# Patient Record
Sex: Female | Born: 1960 | Race: White | Hispanic: No | Marital: Married | Smoking: Current some day smoker
Health system: Southern US, Community
[De-identification: ages and names within clinical notes are randomized; demographics above are authoritative.]

## PROBLEM LIST (undated history)

## (undated) DIAGNOSIS — F319 Bipolar disorder, unspecified: Secondary | ICD-10-CM

## (undated) DIAGNOSIS — F259 Schizoaffective disorder, unspecified: Secondary | ICD-10-CM

## (undated) HISTORY — DX: Bipolar disorder, unspecified: F31.9

---

## 1997-09-25 ENCOUNTER — Inpatient Hospital Stay (HOSPITAL_COMMUNITY): Admission: RE | Admit: 1997-09-25 | Discharge: 1997-09-29 | Payer: Self-pay | Admitting: *Deleted

## 1997-10-01 ENCOUNTER — Encounter (HOSPITAL_COMMUNITY): Admission: RE | Admit: 1997-10-01 | Discharge: 1997-12-30 | Payer: Self-pay

## 1997-10-04 ENCOUNTER — Ambulatory Visit (HOSPITAL_COMMUNITY): Admission: RE | Admit: 1997-10-04 | Discharge: 1997-10-04 | Payer: Self-pay

## 1998-01-03 ENCOUNTER — Emergency Department (HOSPITAL_COMMUNITY): Admission: EM | Admit: 1998-01-03 | Discharge: 1998-01-03 | Payer: Self-pay

## 1998-01-07 ENCOUNTER — Other Ambulatory Visit: Admission: RE | Admit: 1998-01-07 | Discharge: 1998-01-07 | Payer: Self-pay | Admitting: Obstetrics and Gynecology

## 1999-04-22 ENCOUNTER — Other Ambulatory Visit: Admission: RE | Admit: 1999-04-22 | Discharge: 1999-04-22 | Payer: Self-pay | Admitting: *Deleted

## 2000-07-07 ENCOUNTER — Emergency Department (HOSPITAL_COMMUNITY): Admission: EM | Admit: 2000-07-07 | Discharge: 2000-07-07 | Payer: Self-pay | Admitting: Emergency Medicine

## 2000-09-01 ENCOUNTER — Other Ambulatory Visit: Admission: RE | Admit: 2000-09-01 | Discharge: 2000-09-01 | Payer: Self-pay | Admitting: *Deleted

## 2001-02-23 ENCOUNTER — Encounter: Admission: RE | Admit: 2001-02-23 | Discharge: 2001-02-23 | Payer: Self-pay | Admitting: Internal Medicine

## 2001-02-23 ENCOUNTER — Encounter: Payer: Self-pay | Admitting: Internal Medicine

## 2001-10-19 ENCOUNTER — Other Ambulatory Visit: Admission: RE | Admit: 2001-10-19 | Discharge: 2001-10-19 | Payer: Self-pay | Admitting: Obstetrics and Gynecology

## 2001-11-21 ENCOUNTER — Encounter: Payer: Self-pay | Admitting: Emergency Medicine

## 2001-11-21 ENCOUNTER — Emergency Department (HOSPITAL_COMMUNITY): Admission: EM | Admit: 2001-11-21 | Discharge: 2001-11-21 | Payer: Self-pay | Admitting: Emergency Medicine

## 2002-01-27 ENCOUNTER — Encounter: Payer: Self-pay | Admitting: Internal Medicine

## 2002-01-27 ENCOUNTER — Ambulatory Visit (HOSPITAL_COMMUNITY): Admission: RE | Admit: 2002-01-27 | Discharge: 2002-01-27 | Payer: Self-pay | Admitting: Internal Medicine

## 2002-09-06 ENCOUNTER — Encounter: Payer: Self-pay | Admitting: Obstetrics and Gynecology

## 2002-09-06 ENCOUNTER — Encounter: Admission: RE | Admit: 2002-09-06 | Discharge: 2002-09-06 | Payer: Self-pay | Admitting: Obstetrics and Gynecology

## 2002-10-26 ENCOUNTER — Other Ambulatory Visit: Admission: RE | Admit: 2002-10-26 | Discharge: 2002-10-26 | Payer: Self-pay | Admitting: Obstetrics and Gynecology

## 2003-05-15 ENCOUNTER — Encounter: Admission: RE | Admit: 2003-05-15 | Discharge: 2003-08-13 | Payer: Self-pay | Admitting: Internal Medicine

## 2003-10-25 ENCOUNTER — Encounter: Admission: RE | Admit: 2003-10-25 | Discharge: 2003-10-25 | Payer: Self-pay | Admitting: Obstetrics and Gynecology

## 2003-11-01 ENCOUNTER — Other Ambulatory Visit: Admission: RE | Admit: 2003-11-01 | Discharge: 2003-11-01 | Payer: Self-pay | Admitting: Obstetrics and Gynecology

## 2004-04-09 ENCOUNTER — Ambulatory Visit: Payer: Self-pay | Admitting: Internal Medicine

## 2004-10-30 ENCOUNTER — Encounter: Admission: RE | Admit: 2004-10-30 | Discharge: 2004-10-30 | Payer: Self-pay | Admitting: Obstetrics and Gynecology

## 2005-12-02 ENCOUNTER — Encounter: Admission: RE | Admit: 2005-12-02 | Discharge: 2005-12-02 | Payer: Self-pay | Admitting: Obstetrics and Gynecology

## 2007-07-21 ENCOUNTER — Encounter: Payer: Self-pay | Admitting: Internal Medicine

## 2007-07-21 DIAGNOSIS — F319 Bipolar disorder, unspecified: Secondary | ICD-10-CM

## 2007-10-18 ENCOUNTER — Telehealth (INDEPENDENT_AMBULATORY_CARE_PROVIDER_SITE_OTHER): Payer: Self-pay | Admitting: *Deleted

## 2008-07-03 ENCOUNTER — Ambulatory Visit: Payer: Self-pay | Admitting: Psychiatry

## 2008-07-11 ENCOUNTER — Ambulatory Visit: Payer: Self-pay | Admitting: Psychiatry

## 2008-08-01 ENCOUNTER — Ambulatory Visit: Payer: Self-pay | Admitting: Psychiatry

## 2015-05-10 ENCOUNTER — Ambulatory Visit (INDEPENDENT_AMBULATORY_CARE_PROVIDER_SITE_OTHER): Payer: 59 | Admitting: Psychiatry

## 2015-05-10 VITALS — BP 118/64 | HR 82 | Ht 64.75 in | Wt 163.2 lb

## 2015-05-10 DIAGNOSIS — F3162 Bipolar disorder, current episode mixed, moderate: Secondary | ICD-10-CM | POA: Diagnosis not present

## 2015-05-10 DIAGNOSIS — F3110 Bipolar disorder, current episode manic without psychotic features, unspecified: Secondary | ICD-10-CM

## 2015-05-10 MED ORDER — LITHIUM CARBONATE 300 MG PO TABS: ORAL_TABLET | ORAL | Status: DC

## 2015-05-10 MED ORDER — HALOPERIDOL 2 MG PO TABS
2.0000 mg | ORAL_TABLET | Freq: Two times a day (BID) | ORAL | Status: DC
Start: 2015-05-10 — End: 2015-10-23

## 2015-05-10 MED ORDER — LAMOTRIGINE 200 MG PO TABS: 200.0000 mg | ORAL_TABLET | Freq: Every day | ORAL | Status: DC

## 2015-05-10 NOTE — Progress Notes (Signed)
Psychiatric Initial Adult Assessment   Patient Identification: Megan Miranda MRN:  [id number] Date of Evaluation:  05/10/2015 Referral Source: Self-referred Chief Complaint:   need my medications Visit Diagnosis: Bipolar disorder1   ICD-9-CM ICD-10-CM   1. Bipolar 1 disorder, mixed, moderate (HCC) 296.62 F31.62 Lithium level     TSH     COMPLETE METABOLIC PANEL WITH GFR   Diagnosis:   Patient Active Problem List   Diagnosis Date Noted  . BIPOLAR I DISORDER MOST RECENT EPISODE UNSPEC [F31.9] 07/21/2007   History of Present Illness:  This patient is a 54 year old divorced female who is on disability for bipolar disorder. Her previous psychiatrist has left private practice. The patient is actually doing well. She still on her medicines but she needs renewal of prescriptions. She is on multiple psychotropic medicines. 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 She lives alone. She likes to walk and reading. Presently she is in no relationships. She has no children. The patient denies daily depression. She is sleeping and eating well. She's got good energy. She is no problems concentrating. She is a good sense of worth and denies being suicidal now or ever. The patient is a poor historian. For the most part she was interviewed alone but in the last 15 minutes we brought in her brother who brought her his name is Megan Miranda. The patient denies the use of alcohol or drugs. He's never used any these agents. Patient is never been psychotic. His best I can tell the year 2012 or 13 she was hospitalized at [facility] for a manic episode. She claims she is that episode of depression but is unclear the exact symptoms related to it or when it occurred. She claims she's been psychiatrically hospitalized over 4 times. The patient denies symptoms of a specific anxiety disorder. The patient grew up in [place] her parents were intact and she  did well in school. She never experienced any physical or sexual abuse. She does not smoke cigarettes. At this time the patient seems quite stable. Medically she has no medical conditions that she's being actively treated for. She's been in psychotherapy in the distant past for about 10 years. She's been under care Dr. Candis Schatz and Dr. Tamala Julian in the past. Elements:   Associated Signs/Symptoms: Depression Symptoms:   (Hypo) Manic Symptoms:   Anxiety Symptoms:   Psychotic Symptoms:   PTSD Symptoms:   Past Medical History: No past medical history on file. No past surgical history on file. Family History: No family history on file. Social History:   Social History   Social History  . Marital Status: Married    Spouse Name: N/A  . Number of Children: N/A  . Years of Education: N/A   Social History Main Topics  . Smoking status: Not on file  . Smokeless tobacco: Not on file  . Alcohol Use: Not on file  . Drug Use: Not on file  . Sexual Activity: Not on file   Other Topics Concern  . Not on file   Social History Narrative  . No narrative on file   Additional Social History:   Musculoskeletal: Strength & Muscle Tone: within normal limits Gait & Station: normal Patient leans: Right  Psychiatric Specialty Exam: HPI  ROS  Blood pressure 118/64, pulse 82, height 5' 4.75" (1.645 m), weight 163 lb 3.2 oz (74.027 kg).Body mass index is 27.36 kg/(m^2).  General Appearance: Casual  Eye Contact:  Good  Speech:  Clear and Coherent  Volume:  Normal  Mood:  Euthymic  Affect:  Blunt  Thought Process:  Coherent  Orientation:  Full (Time, Place, and Person)  Thought Content:  WDL  Suicidal Thoughts:  No  Homicidal Thoughts:  No  Memory:  NA  Judgement:  Good  Insight:  Good  Psychomotor Activity:  Normal  Concentration:  Good  Recall:  Good  Fund of Knowledge:Good  Language: Good  Akathisia:  No  Handed:  Right  AIMS (if indicated):    Assets:  Desire for Improvement   ADL's:  Intact  Cognition: WNL  Sleep:   Good    Is the patient at risk to self?  No. Has the patient been a risk to self in the past 6 months?  No. Has the patient been a risk to self within the distant past?  No. Is the patient a risk to others?  No. Has the patient been a risk to others in the past 6 months?  No. Has the patient been a risk to others within the distant past?  No.  Allergies:  Allergies not on file Current Medications: Current Outpatient Prescriptions  Medication Sig Dispense Refill  . haloperidol (HALDOL) 2 MG tablet Take 1 tablet (2 mg total) by mouth 2 (two) times daily. 30 tablet 5  . lamoTRIgine (LAMICTAL) 200 MG tablet Take 1 tablet (200 mg total) by mouth daily. 30 tablet 5  . lithium 300 MG tablet 3   qhs 90 tablet 5   No current facility-administered medications for this visit.    Previous Psychotropic Medications: Yes   Substance Abuse History in the last 12 months:  No.  Consequences of Substance Abuse: NA  Medical Decision Making:  Established Problem, Stable/Improving (1)  Treatment Plan Summary: At this time this patient clearly has a history of bipolar disorder. This is supported by the fact that she's had 2 psychiatrists make this diagnosis, that she is on disability for this condition and that she's been psychiatrically hospitalized multiple times. The patient did know the doses of her medicines we called her drugstore and put it in the computer. The patient is taking Lamictal 200 mg at night, lithium 300 mg 3 at night and Haldol 2 mg at night. Patient demonstrates no evidence of tardive dyskinesia at this time. Today we spent over 50% of time going over the education about her illness and about the use of her medications. Today we'll renew these medications will also get her to have some blood work done which included a lithium level a TSH and a comprehensive metabolic panel. The patient will think about therapy but at this time the patient is  doing well. Her brother Megan Miranda is very supportive and will bring her to our sessions. This patient return to see me in 2 and half months. Also get her to sign a release for her last discharge summary from [place] in 2013.    Gillermo Poch IRVING 12/16/201610:38 AM

## 2015-07-24 ENCOUNTER — Ambulatory Visit (HOSPITAL_COMMUNITY): Payer: Self-pay | Admitting: Psychiatry

## 2015-09-13 ENCOUNTER — Ambulatory Visit (HOSPITAL_COMMUNITY): Payer: Self-pay | Admitting: Psychiatry

## 2015-10-14 LAB — LITHIUM LEVEL: LITHIUM LVL: 1.2 meq/L (ref 0.80–1.40)

## 2015-10-23 ENCOUNTER — Ambulatory Visit (INDEPENDENT_AMBULATORY_CARE_PROVIDER_SITE_OTHER): Payer: 59 | Admitting: Psychiatry

## 2015-10-23 DIAGNOSIS — F3130 Bipolar disorder, current episode depressed, mild or moderate severity, unspecified: Secondary | ICD-10-CM

## 2015-10-23 DIAGNOSIS — F3162 Bipolar disorder, current episode mixed, moderate: Secondary | ICD-10-CM

## 2015-10-23 MED ORDER — LAMOTRIGINE 200 MG PO TABS: 200.0000 mg | ORAL_TABLET | Freq: Every day | ORAL | Status: DC

## 2015-10-23 MED ORDER — LITHIUM CARBONATE 300 MG PO TABS: ORAL_TABLET | ORAL | Status: DC

## 2015-10-23 MED ORDER — HALOPERIDOL 2 MG PO TABS: 2.0000 mg | ORAL_TABLET | Freq: Two times a day (BID) | ORAL | Status: DC

## 2015-10-23 NOTE — Progress Notes (Signed)
Patient ID: Megan Miranda, female   DOB: Nov 30, 1960, 55 y.o.   MRN: [id number]  Psychiatric Initial Adult Assessment   Patient Identification: Megan Miranda MRN:  [id number] Date of Evaluation:  10/23/2015 Referral Source: Self-referred Chief Complaint:   need my medications Visit Diagnosis: Bipolar disorder1   ICD-9-CM ICD-10-CM   1. Bipolar 1 disorder, mixed, moderate (HCC) 296.62 [id number] COMPLETE METABOLIC PANEL WITH GFR     Lithium level   Diagnosis:   Patient Active Problem List   Diagnosis Date Noted  . BIPOLAR I DISORDER MOST RECENT EPISODE UNSPEC [F31.9] 07/21/2007   Today the patient is seen for a 15 minute visit. She was seen almost 6 months ago and did not reappear after her first initial evaluation. She was supposed to come back in 2 months but never came. She now is back if she is running out of her medicines. This time she is without her brother. The patient claims that she is doing well. She denies daily depression. She denies any mania. Generally she sleeping and eating well. She demonstrates no tremor. Patient had a lithium level of 1.4 but never got her comprehensive metabolic panel or her thyroids. The patient was under the care of her primary care doctor but has disappeared from. I strongly recommended she return to a primary care doctor rather to her old one or a new one. The patient claims she is functioning well. She denies the use of alcohol or drugs. She actually says that she's better. She says she doesn't need her brother necessarily and she's taking care of herself. The patient is interested in joining swimming club. She likes to walk a great deal. Today we insisted that the patient return to see me just 2 months with her brother and that in fact she go back and get a repeat lithium level and a comprehensive metabolic panel. The patient shows no evidence of psychosis. She's not suicidal. She seems fairly stable. Is very clear this patient is noncompliant. Associated  Signs/Symptoms: Depression Symptoms:   (Hypo) Manic Symptoms:   Anxiety Symptoms:   Psychotic Symptoms:   PTSD Symptoms:   Past Medical History: No past medical history on file. No past surgical history on file. Family History: No family history on file. Social History:   Social History   Social History  . Marital Status: Married    Spouse Name: N/A  . Number of Children: N/A  . Years of Education: N/A   Social History Main Topics  . Smoking status: Not on file  . Smokeless tobacco: Not on file  . Alcohol Use: Not on file  . Drug Use: Not on file  . Sexual Activity: Not on file   Other Topics Concern  . Not on file   Social History Narrative  . No narrative on file   Additional Social History:   Musculoskeletal: Strength & Muscle Tone: within normal limits Gait & Station: normal Patient leans: Right  Psychiatric Specialty Exam: HPI  ROS  There were no vitals taken for this visit.There is no weight on file to calculate BMI.  General Appearance: Casual  Eye Contact:  Good  Speech:  Clear and Coherent  Volume:  Normal  Mood:  Euthymic  Affect:  Blunt  Thought Process:  Coherent  Orientation:  Full (Time, Place, and Person)  Thought Content:  WDL  Suicidal Thoughts:  No  Homicidal Thoughts:  No  Memory:  NA  Judgement:  Good  Insight:  Good  Psychomotor Activity:  Normal  Concentration:  Good  Recall:  Good  Fund of Knowledge:Good  Language: Good  Akathisia:  No  Handed:  Right  AIMS (if indicated):    Assets:  Desire for Improvement  ADL's:  Intact  Cognition: WNL  Sleep:   Good    Is the patient at risk to self?  No. Has the patient been a risk to self in the past 6 months?  No. Has the patient been a risk to self within the distant past?  No. Is the patient a risk to others?  No. Has the patient been a risk to others in the past 6 months?  No. Has the patient been a risk to others within the distant past?  No.  Allergies:  Not on  File Current Medications: Current Outpatient Prescriptions  Medication Sig Dispense Refill  . haloperidol (HALDOL) 2 MG tablet Take 1 tablet (2 mg total) by mouth 2 (two) times daily. 30 tablet 3  . lamoTRIgine (LAMICTAL) 200 MG tablet Take 1 tablet (200 mg total) by mouth daily. 30 tablet 3  . lithium 300 MG tablet 3   qhs 90 tablet 3   No current facility-administered medications for this visit.    Previous Psychotropic Medications: Yes   Substance Abuse History in the last 12 months:  No.  Consequences of Substance Abuse: NA  Medical Decision Making:  Established Problem, Stable/Improving (1) At this time the patient will continue taking Lamictal 200 mg continue her lithium 300 mg 3 at night and continue Haldol 2 mg. The patient shows no evidence of tardive dyskinesia. Patient actually says she's been on Haldol for well over a decade. Is clear any more information. We'll start off by getting some blood work and have her return in about 8-9 weeks with her brother. The patient seems emotionally stable. But in fact she did not return as I asked him to months but instead in 5 months. We'll give her prescriptions only enough to last 2 or 3 months. The patient demonstrates no evidence of lithium toxicity. She says her mood is stable. She says she's been taking the same dose for many years. Patient certainly is not suicidal and seems to be functioning.  Megan Miranda 5/31/20173:36 PM

## 2015-10-28 ENCOUNTER — Other Ambulatory Visit (HOSPITAL_COMMUNITY): Payer: Self-pay | Admitting: Psychiatry

## 2015-12-03 ENCOUNTER — Other Ambulatory Visit (HOSPITAL_COMMUNITY): Payer: Self-pay | Admitting: Psychiatry

## 2015-12-04 ENCOUNTER — Other Ambulatory Visit (HOSPITAL_COMMUNITY): Payer: Self-pay

## 2015-12-04 MED ORDER — HALOPERIDOL 2 MG PO TABS: 2.0000 mg | ORAL_TABLET | Freq: Two times a day (BID) | ORAL | Status: DC

## 2015-12-26 ENCOUNTER — Other Ambulatory Visit (HOSPITAL_COMMUNITY): Payer: Self-pay | Admitting: Psychiatry

## 2015-12-26 MED ORDER — LITHIUM CARBONATE 300 MG PO TABS: ORAL_TABLET | ORAL | 3 refills | Status: DC

## 2015-12-26 MED ORDER — LAMOTRIGINE 200 MG PO TABS: 200.0000 mg | ORAL_TABLET | Freq: Every day | ORAL | 3 refills | Status: DC

## 2015-12-26 MED ORDER — HALOPERIDOL 2 MG PO TABS: 2.0000 mg | ORAL_TABLET | Freq: Two times a day (BID) | ORAL | 3 refills | Status: DC

## 2016-01-16 ENCOUNTER — Ambulatory Visit (HOSPITAL_COMMUNITY): Payer: Self-pay | Admitting: Psychiatry

## 2016-02-29 LAB — LITHIUM LEVEL: LITHIUM LVL: 1 mmol/L (ref 0.80–1.40)

## 2016-03-02 ENCOUNTER — Encounter: Payer: Self-pay | Admitting: Psychiatry

## 2016-03-19 ENCOUNTER — Ambulatory Visit (HOSPITAL_COMMUNITY): Payer: Self-pay | Admitting: Psychiatry

## 2016-04-09 ENCOUNTER — Other Ambulatory Visit (HOSPITAL_COMMUNITY): Payer: Self-pay

## 2016-04-09 MED ORDER — LAMOTRIGINE 200 MG PO TABS: 200.0000 mg | ORAL_TABLET | Freq: Every day | ORAL | 0 refills | Status: DC

## 2016-04-09 MED ORDER — HALOPERIDOL 2 MG PO TABS: 2.0000 mg | ORAL_TABLET | Freq: Two times a day (BID) | ORAL | 0 refills | Status: DC

## 2016-04-09 MED ORDER — LITHIUM CARBONATE 300 MG PO TABS: ORAL_TABLET | ORAL | 0 refills | Status: DC

## 2016-04-10 ENCOUNTER — Telehealth (HOSPITAL_COMMUNITY): Payer: Self-pay

## 2016-04-10 NOTE — Telephone Encounter (Signed)
Medication refill requests - Telephone call with pt to inform her medication refills had already been sent to her [facility] as pt. requested.  Reminded of need to keep appointment on 05/13/16 for further orders and provided new outpt. address.   Patient stated understanding and to call back if any other concerns.

## 2016-05-13 ENCOUNTER — Ambulatory Visit (HOSPITAL_COMMUNITY): Payer: Self-pay | Admitting: Psychiatry

## 2016-06-18 ENCOUNTER — Ambulatory Visit (INDEPENDENT_AMBULATORY_CARE_PROVIDER_SITE_OTHER): Payer: 59 | Admitting: Psychiatry

## 2016-06-18 ENCOUNTER — Encounter (HOSPITAL_COMMUNITY): Payer: Self-pay | Admitting: Psychiatry

## 2016-06-18 VITALS — BP 140/86 | HR 103 | Ht 65.0 in | Wt 167.0 lb

## 2016-06-18 DIAGNOSIS — F17210 Nicotine dependence, cigarettes, uncomplicated: Secondary | ICD-10-CM

## 2016-06-18 DIAGNOSIS — F3130 Bipolar disorder, current episode depressed, mild or moderate severity, unspecified: Secondary | ICD-10-CM | POA: Diagnosis not present

## 2016-06-18 MED ORDER — LAMOTRIGINE 200 MG PO TABS: 200.0000 mg | ORAL_TABLET | Freq: Every day | ORAL | 1 refills | Status: DC

## 2016-06-18 MED ORDER — LITHIUM CARBONATE 300 MG PO TABS: ORAL_TABLET | ORAL | 0 refills | Status: DC

## 2016-06-18 MED ORDER — HALOPERIDOL 2 MG PO TABS: 2.0000 mg | ORAL_TABLET | Freq: Two times a day (BID) | ORAL | 0 refills | Status: DC

## 2016-06-18 NOTE — Progress Notes (Signed)
Patient ID: [name], female   DOB: 1961/03/07, 56 y.o.   MRN: [id number]  Psychiatric Initial Adult Assessment   Patient Identification: Megan Miranda MRN:  [id number] Date of Evaluation:  06/18/2016 Referral Source: Self-referred Chief Complaint:   need my medications Visit Diagnosis: Bipolar disorder1 No diagnosis found. Diagnosis:   Patient Active Problem List   Diagnosis Date Noted  . BIPOLAR I DISORDER MOST RECENT EPISODE UNSPEC [F31.9] 07/21/2007   Today the patient is seen one time. The patient is actually doing fairly well. She's taking her medicines just as prescribed. She denies depression or euphoria. She's not irritable. She is actually sleeping and eating well. She denies the use of alcohol or drugs. The patient is enjoying life andstatus as they are. Her brother is helping her get her medications. There were some problems with her pharmacy but were going to work that out. The patient's lithium level is somewhat higher side and on her next visit in 3 months we will repeat her lithium level. The patient shows no signs of lithium toxicity. He shows no signs of tardive dyskinesia. She takes Lamictal 1 over basis, Haldol and lithium. The patient is not suicidal. She denies any physical complaints at this time. Depression Symptoms:   (Hypo) Manic Symptoms:   Anxiety Symptoms:   Psychotic Symptoms:   PTSD Symptoms:   Past Medical History:  Past Medical History:  Diagnosis Date  . Bipolar disorder ([facility])    History reviewed. No pertinent surgical history. Family History: History reviewed. No pertinent family history. Social History:   Social History   Social History  . Marital status: Married    Spouse name: N/A  . Number of children: N/A  . Years of education: N/A   Social History Main Topics  . Smoking status: Current Some Day Smoker    Years: 2.00  . Smokeless tobacco: Never Used     Comment: Smokes a few cigarettes a week.   . Alcohol use No  . Drug use: No  .  Sexual activity: No   Other Topics Concern  . None   Social History Narrative  . None   Additional Social History:   Musculoskeletal: Strength & Muscle Tone: within normal limits Gait & Station: normal Patient leans: Right  Psychiatric Specialty Exam: HPI  ROS  Blood pressure 140/86, pulse (!) 103, height 5\' 5"  (1.651 m), weight 167 lb (75.8 kg).Body mass index is 27.79 kg/m.  General Appearance: Casual  Eye Contact:  Good  Speech:  Clear and Coherent  Volume:  Normal  Mood:  Euthymic  Affect:  Blunt  Thought Process:  Coherent  Orientation:  Full (Time, Place, and Person)  Thought Content:  WDL  Suicidal Thoughts:  No  Homicidal Thoughts:  No  Memory:  NA  Judgement:  Good  Insight:  Good  Psychomotor Activity:  Normal  Concentration:  Good  Recall:  Good  Fund of Knowledge:Good  Language: Good  Akathisia:  No  Handed:  Right  AIMS (if indicated):    Assets:  Desire for Improvement  ADL's:  Intact  Cognition: WNL  Sleep:   Good    Is the patient at risk to self?  No. Has the patient been a risk to self in the past 6 months?  No. Has the patient been a risk to self within the distant past?  No. Is the patient a risk to others?  No. Has the patient been a risk to others in the past 6 months?  No. Has the patient been a risk to others within the distant past?  No.  Allergies:   Allergies  Allergen Reactions  . Soap Rash   Current Medications: Current Outpatient Prescriptions  Medication Sig Dispense Refill  . haloperidol (HALDOL) 2 MG tablet Take 1 tablet (2 mg total) by mouth 2 (two) times daily. 180 tablet 0  . haloperidol (HALDOL) 2 MG tablet Take 1 tablet (2 mg total) by mouth 2 (two) times daily. 180 tablet 0  . lamoTRIgine (LAMICTAL) 200 MG tablet Take 1 tablet (200 mg total) by mouth daily. 90 tablet 1  . lithium 300 MG tablet 3   qhs 270 tablet 0   No current facility-administered medications for this visit.     Previous Psychotropic  Medications: Yes   Substance Abuse History in the last 12 months:  No.  Consequences of Substance Abuse: NA  Medical Decision Making:  At this time the patient continue all prior medications including her Lamictal the patient return to see me in about 3 months where we will repeat a lithium level. Patient is not suicidal. She is functioning fairly well. His rational clear and appropriate. She seems to be stable. She is no complaints. Charlestine Night Jontue Crumpacker 1/25/20184:49 PM

## 2016-08-28 ENCOUNTER — Other Ambulatory Visit (HOSPITAL_COMMUNITY): Payer: Self-pay

## 2016-08-28 MED ORDER — LITHIUM CARBONATE 300 MG PO TABS: ORAL_TABLET | ORAL | 0 refills | Status: DC

## 2016-08-28 MED ORDER — LAMOTRIGINE 200 MG PO TABS: 200.0000 mg | ORAL_TABLET | Freq: Every day | ORAL | 0 refills | Status: DC

## 2016-08-28 MED ORDER — HALOPERIDOL 2 MG PO TABS: 2.0000 mg | ORAL_TABLET | Freq: Two times a day (BID) | ORAL | 0 refills | Status: DC

## 2016-09-09 ENCOUNTER — Ambulatory Visit (HOSPITAL_COMMUNITY): Payer: Self-pay | Admitting: Psychiatry

## 2016-10-27 ENCOUNTER — Other Ambulatory Visit (HOSPITAL_COMMUNITY): Payer: Self-pay

## 2016-10-27 MED ORDER — HALOPERIDOL 2 MG PO TABS: 2.0000 mg | ORAL_TABLET | Freq: Two times a day (BID) | ORAL | 0 refills | Status: DC

## 2016-10-27 MED ORDER — LAMOTRIGINE 200 MG PO TABS: 200.0000 mg | ORAL_TABLET | Freq: Every day | ORAL | 0 refills | Status: DC

## 2016-10-28 ENCOUNTER — Telehealth (HOSPITAL_COMMUNITY): Payer: Self-pay

## 2016-10-28 NOTE — Telephone Encounter (Signed)
Medication management - Telephone call with patient after she called to request refill of medications. Informed 90 day refills of Haldol and Lamictal sent to her pharmacy 10/27/16 and questioned why patient canceled and rescheduled her appt. again.  Patient stated she had summer flu like symptoms and was not feeling well enough to come in so she rescheduled for 01/22/17.  Informed patient she would need to call back when Lithium needs filling and would need to keep next appointment for any further refills as has been since 06/18/16 since patient last evaluated and patient agreed with plan.

## 2016-10-29 ENCOUNTER — Ambulatory Visit (HOSPITAL_COMMUNITY): Payer: Self-pay | Admitting: Psychiatry

## 2016-11-02 ENCOUNTER — Telehealth (HOSPITAL_COMMUNITY): Payer: Self-pay

## 2016-11-02 NOTE — Telephone Encounter (Signed)
Patient would like to know if you want a lithium level before her next appointment. Please review and advise, thank you

## 2016-11-05 NOTE — Telephone Encounter (Signed)
Met with Dr. Casimiro Needle who reviewed patient's record and agreed he would like to check patient's Lithium level prior to next appointment.  Dr. Casimiro Needle also ordered a TSH and comprehensive metabolic panel for patient to come in and do fasting and prior to taking medications in the morning.

## 2016-11-06 NOTE — Telephone Encounter (Signed)
Okay called the patient and her husband to let them know they can come by any morning and get the blood work done.

## 2017-01-12 ENCOUNTER — Telehealth (HOSPITAL_COMMUNITY): Payer: Self-pay

## 2017-01-12 NOTE — Telephone Encounter (Signed)
Patient called and said she needs a 90 day refill , I told patient I needed to speak to you first as she has cancelled her last 3 appointments and is cancelling her appointment on 9/5 and said she will reschedule it for December. We have not seen her since January.

## 2017-01-19 NOTE — Telephone Encounter (Signed)
I called patient and advised her that Dr. Casimiro Needle said she needs to come to appointment or no refill, patinet said she would ne here

## 2017-01-22 ENCOUNTER — Ambulatory Visit (HOSPITAL_COMMUNITY): Payer: Self-pay | Admitting: Psychiatry

## 2017-01-27 ENCOUNTER — Ambulatory Visit (INDEPENDENT_AMBULATORY_CARE_PROVIDER_SITE_OTHER): Payer: 59 | Admitting: Psychiatry

## 2017-01-27 ENCOUNTER — Encounter (HOSPITAL_COMMUNITY): Payer: Self-pay | Admitting: Psychiatry

## 2017-01-27 VITALS — BP 128/74 | HR 96 | Ht 64.0 in | Wt 160.0 lb

## 2017-01-27 DIAGNOSIS — F17210 Nicotine dependence, cigarettes, uncomplicated: Secondary | ICD-10-CM

## 2017-01-27 DIAGNOSIS — F3130 Bipolar disorder, current episode depressed, mild or moderate severity, unspecified: Secondary | ICD-10-CM

## 2017-01-27 DIAGNOSIS — F3162 Bipolar disorder, current episode mixed, moderate: Secondary | ICD-10-CM

## 2017-01-27 MED ORDER — LITHIUM CARBONATE 300 MG PO TABS: ORAL_TABLET | ORAL | 0 refills | Status: DC

## 2017-01-27 MED ORDER — HALOPERIDOL 2 MG PO TABS: 2.0000 mg | ORAL_TABLET | Freq: Two times a day (BID) | ORAL | 0 refills | Status: DC

## 2017-01-27 MED ORDER — LAMOTRIGINE 200 MG PO TABS: 200.0000 mg | ORAL_TABLET | Freq: Every day | ORAL | 0 refills | Status: DC

## 2017-01-27 NOTE — Progress Notes (Signed)
Patient ID: Megan Miranda, female   DOB: 11-01-1960, 56 y.o.   MRN: [id number]  Psychiatric Initial Adult Assessment   Patient Identification: Megan Miranda MRN:  [id number] Date of Evaluation:  01/27/2017 Referral Source: Self-referred Chief Complaint:   Chief Complaint    Follow-up     need my medications Visit Diagnosis: Bipolar disorder1   ICD-10-CM   1. Bipolar 1 disorder, mixed, moderate (HCC) F31.62 Lithium level    Comprehensive Metabolic Panel (CMET)    TSH   Diagnosis:   Patient Active Problem List   Diagnosis Date Noted  . BIPOLAR I DISORDER MOST RECENT EPISODE UNSPEC [F31.9] 07/21/2007   Today the patient is actually doing well. His appointments. Overall overall is good. She sleeping and eating well. She shows no evidence of psychosis. He shows no evidence of mania. It is not clear she really truly ever had. The patient drinks no alcohol uses no drugs. He lives in a townhouse she likes. She exercises by walking. Patient patient message as prescribed. She demonstrates no evidence of tardive dyskinesia. She's taking   Haldol and she got much worse. The patient has good energy. She is dressed well. She's rational and reasonable.  Anxiety Symptoms:   Psychotic Symptoms:   PTSD Symptoms:   Past Medical History:  Past Medical History:  Diagnosis Date  . Bipolar disorder ([place])    History reviewed. No pertinent surgical history. Family History: History reviewed. No pertinent family history. Social History:   Social History   Social History  . Marital status: Married    Spouse name: N/A  . Number of children: N/A  . Years of education: N/A   Social History Main Topics  . Smoking status: Current Some Day Smoker    Packs/day: 0.10    Years: 2.00  . Smokeless tobacco: Never Used     Comment: Smokes a few cigarettes a week.   . Alcohol use No  . Drug use: No  . Sexual activity: No   Other Topics Concern  . None   Social History Narrative  . None   Additional  Social History:   Musculoskeletal: Strength & Muscle Tone: within normal limits Gait & Station: normal Patient leans: Right  Psychiatric Specialty Exam: HPI  ROS  Blood pressure 128/74, pulse 96, height 5\' 4"  (1.626 m), weight 160 lb (72.6 kg), SpO2 97 %.Body mass index is 27.46 kg/m.  General Appearance: Casual  Eye Contact:  Good  Speech:  Clear and Coherent  Volume:  Normal  Mood:  Euthymic  Affect:  Blunt  Thought Process:  Coherent  Orientation:  Full (Time, Place, and Person)  Thought Content:  WDL  Suicidal Thoughts:  No  Homicidal Thoughts:  No  Memory:  NA  Judgement:  Good  Insight:  Good  Psychomotor Activity:  Normal  Concentration:  Good  Recall:  Good  Fund of Knowledge:Good  Language: Good  Akathisia:  No  Handed:  Right  AIMS (if indicated):    Assets:  Desire for Improvement  ADL's:  Intact  Cognition: WNL  Sleep:   Good    Is the patient at risk to self?  No. Has the patient been a risk to self in the past 6 months?  No. Has the patient been a risk to self within the distant past?  No. Is the patient a risk to others?  No. Has the patient been a risk to others in the past 6 months?  No. Has the patient been a  risk to others within the distant past?  No.  Allergies:   Allergies  Allergen Reactions  . Soap Rash   Current Medications: Current Outpatient Prescriptions  Medication Sig Dispense Refill  . haloperidol (HALDOL) 2 MG tablet Take 1 tablet (2 mg total) by mouth 2 (two) times daily. 180 tablet 0  . lamoTRIgine (LAMICTAL) 200 MG tablet Take 1 tablet (200 mg total) by mouth daily. 90 tablet 0  . lithium 300 MG tablet 3   qhs 270 tablet 0   No current facility-administered medications for this visit.     Previous Psychotropic Medications: Yes   Substance Abuse History in the last 12 months:  No.  Consequences of Substance Abuse:  at this time we'll go ahead and get a lithium blood level and other blood work. She takes lithium 300  mg at night and she'll. She takes how often milligrams. Continued as well she also takes Lamictal 200 mg every night. I think the patient is very stable at this time. The possibility of getting off alcohol should possibly be considered. Vascular come again in 2-3 months bring her brother at bedtime. Patient is stable. She's not suicidal. He denies chest pain or shortness of breath.

## 2017-04-06 ENCOUNTER — Telehealth (HOSPITAL_COMMUNITY): Payer: Self-pay

## 2017-04-06 NOTE — Telephone Encounter (Signed)
Patients son is calling, there is a back order on Haldol right now and he would like to know if there is something else that the patient can take. Please review and advise, thank you

## 2017-04-07 ENCOUNTER — Other Ambulatory Visit (HOSPITAL_COMMUNITY): Payer: Self-pay

## 2017-04-07 MED ORDER — FLUPHENAZINE HCL 1 MG PO TABS: 1.0000 mg | ORAL_TABLET | Freq: Two times a day (BID) | ORAL | 3 refills | Status: DC

## 2017-05-20 ENCOUNTER — Other Ambulatory Visit (HOSPITAL_COMMUNITY): Payer: Self-pay

## 2017-05-20 MED ORDER — LITHIUM CARBONATE 300 MG PO TABS: ORAL_TABLET | ORAL | 0 refills | Status: DC

## 2017-05-20 MED ORDER — LAMOTRIGINE 200 MG PO TABS: 200.0000 mg | ORAL_TABLET | Freq: Every day | ORAL | 0 refills | Status: DC

## 2017-06-02 ENCOUNTER — Ambulatory Visit (HOSPITAL_COMMUNITY): Payer: Self-pay | Admitting: Psychiatry

## 2017-07-27 ENCOUNTER — Other Ambulatory Visit (HOSPITAL_COMMUNITY): Payer: Self-pay

## 2017-07-27 MED ORDER — FLUPHENAZINE HCL 1 MG PO TABS: 1.0000 mg | ORAL_TABLET | Freq: Two times a day (BID) | ORAL | 0 refills | Status: DC

## 2017-07-28 ENCOUNTER — Ambulatory Visit (HOSPITAL_COMMUNITY): Payer: Self-pay | Admitting: Psychiatry

## 2017-08-07 ENCOUNTER — Other Ambulatory Visit (HOSPITAL_COMMUNITY): Payer: Self-pay

## 2017-08-07 MED ORDER — LITHIUM CARBONATE 300 MG PO TABS: ORAL_TABLET | ORAL | 0 refills | Status: DC

## 2017-08-23 ENCOUNTER — Other Ambulatory Visit (HOSPITAL_COMMUNITY): Payer: Self-pay

## 2017-08-23 MED ORDER — FLUPHENAZINE HCL 1 MG PO TABS: 1.0000 mg | ORAL_TABLET | Freq: Two times a day (BID) | ORAL | 1 refills | Status: DC

## 2017-08-23 MED ORDER — LITHIUM CARBONATE 300 MG PO TABS: ORAL_TABLET | ORAL | 0 refills | Status: DC

## 2017-08-23 MED ORDER — LAMOTRIGINE 200 MG PO TABS: 200.0000 mg | ORAL_TABLET | Freq: Every day | ORAL | 1 refills | Status: DC

## 2017-08-25 ENCOUNTER — Ambulatory Visit (HOSPITAL_COMMUNITY): Payer: 59 | Admitting: Psychiatry

## 2017-09-03 ENCOUNTER — Other Ambulatory Visit (HOSPITAL_COMMUNITY): Payer: Self-pay

## 2017-09-03 MED ORDER — FLUPHENAZINE HCL 1 MG PO TABS: 1.0000 mg | ORAL_TABLET | Freq: Two times a day (BID) | ORAL | 2 refills | Status: DC

## 2017-09-03 MED ORDER — LAMOTRIGINE 200 MG PO TABS: 200.0000 mg | ORAL_TABLET | Freq: Every day | ORAL | 1 refills | Status: DC

## 2017-09-03 MED ORDER — LITHIUM CARBONATE 300 MG PO TABS: ORAL_TABLET | ORAL | 2 refills | Status: DC

## 2017-10-13 ENCOUNTER — Ambulatory Visit (HOSPITAL_COMMUNITY): Payer: 59 | Admitting: Psychiatry

## 2017-10-15 ENCOUNTER — Inpatient Hospital Stay (HOSPITAL_COMMUNITY)
Admission: EM | Admit: 2017-10-15 | Discharge: 2017-10-17 | DRG: 918 | Discharge status: Against Medical Advice | Payer: 59 | Attending: Internal Medicine | Admitting: Internal Medicine

## 2017-10-15 ENCOUNTER — Emergency Department (HOSPITAL_COMMUNITY): Payer: 59

## 2017-10-15 ENCOUNTER — Encounter (HOSPITAL_COMMUNITY): Payer: Self-pay | Admitting: Emergency Medicine

## 2017-10-15 DIAGNOSIS — N184 Chronic kidney disease, stage 4 (severe): Secondary | ICD-10-CM | POA: Diagnosis present

## 2017-10-15 DIAGNOSIS — R4182 Altered mental status, unspecified: Secondary | ICD-10-CM | POA: Diagnosis not present

## 2017-10-15 DIAGNOSIS — F17210 Nicotine dependence, cigarettes, uncomplicated: Secondary | ICD-10-CM | POA: Diagnosis present

## 2017-10-15 DIAGNOSIS — D649 Anemia, unspecified: Secondary | ICD-10-CM | POA: Diagnosis present

## 2017-10-15 DIAGNOSIS — E872 Acidosis, unspecified: Secondary | ICD-10-CM | POA: Diagnosis present

## 2017-10-15 DIAGNOSIS — F319 Bipolar disorder, unspecified: Secondary | ICD-10-CM | POA: Diagnosis not present

## 2017-10-15 DIAGNOSIS — T56891A Toxic effect of other metals, accidental (unintentional), initial encounter: Secondary | ICD-10-CM | POA: Diagnosis present

## 2017-10-15 DIAGNOSIS — Y92009 Unspecified place in unspecified non-institutional (private) residence as the place of occurrence of the external cause: Secondary | ICD-10-CM | POA: Diagnosis not present

## 2017-10-15 DIAGNOSIS — F1010 Alcohol abuse, uncomplicated: Secondary | ICD-10-CM | POA: Diagnosis present

## 2017-10-15 DIAGNOSIS — R946 Abnormal results of thyroid function studies: Secondary | ICD-10-CM | POA: Diagnosis present

## 2017-10-15 DIAGNOSIS — E876 Hypokalemia: Secondary | ICD-10-CM | POA: Diagnosis present

## 2017-10-15 DIAGNOSIS — R451 Restlessness and agitation: Secondary | ICD-10-CM | POA: Diagnosis present

## 2017-10-15 DIAGNOSIS — E878 Other disorders of electrolyte and fluid balance, not elsewhere classified: Secondary | ICD-10-CM | POA: Diagnosis present

## 2017-10-15 DIAGNOSIS — R6510 Systemic inflammatory response syndrome (SIRS) of non-infectious origin without acute organ dysfunction: Secondary | ICD-10-CM | POA: Diagnosis present

## 2017-10-15 DIAGNOSIS — F1011 Alcohol abuse, in remission: Secondary | ICD-10-CM

## 2017-10-15 DIAGNOSIS — F250 Schizoaffective disorder, bipolar type: Secondary | ICD-10-CM | POA: Diagnosis present

## 2017-10-15 DIAGNOSIS — N179 Acute kidney failure, unspecified: Secondary | ICD-10-CM | POA: Diagnosis present

## 2017-10-15 DIAGNOSIS — Z87898 Personal history of other specified conditions: Secondary | ICD-10-CM | POA: Diagnosis not present

## 2017-10-15 DIAGNOSIS — E039 Hypothyroidism, unspecified: Secondary | ICD-10-CM

## 2017-10-15 DIAGNOSIS — R7989 Other specified abnormal findings of blood chemistry: Secondary | ICD-10-CM | POA: Diagnosis not present

## 2017-10-15 DIAGNOSIS — E860 Dehydration: Secondary | ICD-10-CM | POA: Diagnosis present

## 2017-10-15 LAB — COMPREHENSIVE METABOLIC PANEL
ALT: 34 U/L (ref 14–54)
ALT: 28 U/L (ref 14–54)
ANION GAP: 9 (ref 5–15)
AST: 33 U/L (ref 15–41)
AST: 32 U/L (ref 15–41)
Albumin: 3.7 g/dL (ref 3.5–5.0)
Albumin: 3.4 g/dL — ABNORMAL LOW (ref 3.5–5.0)
Alkaline Phosphatase: 86 U/L (ref 38–126)
Alkaline Phosphatase: 79 U/L (ref 38–126)
Anion gap: 8 (ref 5–15)
BILIRUBIN TOTAL: 0.7 mg/dL (ref 0.3–1.2)
BILIRUBIN TOTAL: 0.4 mg/dL (ref 0.3–1.2)
BUN: 13 mg/dL (ref 6–20)
BUN: 13 mg/dL (ref 6–20)
CALCIUM: 8.6 mg/dL — AB (ref 8.9–10.3)
CO2: 20 mmol/L — ABNORMAL LOW (ref 22–32)
CO2: 20 mmol/L — ABNORMAL LOW (ref 22–32)
Calcium: 9.3 mg/dL (ref 8.9–10.3)
Chloride: 102 mmol/L (ref 101–111)
Chloride: 105 mmol/L (ref 101–111)
Creatinine, Ser: 1.86 mg/dL — ABNORMAL HIGH (ref 0.44–1.00)
Creatinine, Ser: 2 mg/dL — ABNORMAL HIGH (ref 0.44–1.00)
GFR calc Af Amer: 33 mL/min — ABNORMAL LOW (ref >60)
GFR calc non Af Amer: 26 mL/min — ABNORMAL LOW (ref >60)
GFR, EST AFRICAN AMERICAN: 31 mL/min — AB (ref >60)
GFR, EST NON AFRICAN AMERICAN: 29 mL/min — AB (ref >60)
GLUCOSE: 129 mg/dL — AB (ref 65–99)
Glucose, Bld: 116 mg/dL — ABNORMAL HIGH (ref 65–99)
POTASSIUM: 3 mmol/L — AB (ref 3.5–5.1)
Potassium: 3 mmol/L — ABNORMAL LOW (ref 3.5–5.1)
Sodium: 130 mmol/L — ABNORMAL LOW (ref 135–145)
Sodium: 134 mmol/L — ABNORMAL LOW (ref 135–145)
TOTAL PROTEIN: 5.6 g/dL — AB (ref 6.5–8.1)
Total Protein: 6.3 g/dL — ABNORMAL LOW (ref 6.5–8.1)

## 2017-10-15 LAB — URINALYSIS, ROUTINE W REFLEX MICROSCOPIC
BILIRUBIN URINE: NEGATIVE
Glucose, UA: NEGATIVE mg/dL
HGB URINE DIPSTICK: NEGATIVE
Ketones, ur: NEGATIVE mg/dL
Leukocytes, UA: NEGATIVE
NITRITE: NEGATIVE
PH: 7 (ref 5.0–8.0)
Protein, ur: NEGATIVE mg/dL
SPECIFIC GRAVITY, URINE: 1.003 — AB (ref 1.005–1.030)

## 2017-10-15 LAB — I-STAT CHEM 8, ED
BUN: 13 mg/dL (ref 6–20)
CREATININE: 2 mg/dL — AB (ref 0.44–1.00)
Calcium, Ion: 1.25 mmol/L (ref 1.15–1.40)
Chloride: 98 mmol/L — ABNORMAL LOW (ref 101–111)
Glucose, Bld: 172 mg/dL — ABNORMAL HIGH (ref 65–99)
HEMATOCRIT: 44 % (ref 36.0–46.0)
Hemoglobin: 15 g/dL (ref 12.0–15.0)
Potassium: 3.2 mmol/L — ABNORMAL LOW (ref 3.5–5.1)
Sodium: 128 mmol/L — ABNORMAL LOW (ref 135–145)
TCO2: 22 mmol/L (ref 22–32)

## 2017-10-15 LAB — CBC WITH DIFFERENTIAL/PLATELET
Basophils Absolute: 0 10*3/uL (ref 0.0–0.1)
Basophils Relative: 0 %
Eosinophils Absolute: 0 10*3/uL (ref 0.0–0.7)
Eosinophils Relative: 0 %
HEMATOCRIT: 42 % (ref 36.0–46.0)
HEMOGLOBIN: 15.1 g/dL — AB (ref 12.0–15.0)
Lymphocytes Relative: 6 %
Lymphs Abs: 1 10*3/uL (ref 0.7–4.0)
MCH: 33.3 pg (ref 26.0–34.0)
MCHC: 36 g/dL (ref 30.0–36.0)
MCV: 92.7 fL (ref 78.0–100.0)
MONO ABS: 0.7 10*3/uL (ref 0.1–1.0)
MONOS PCT: 4 %
NEUTROS ABS: 14.9 10*3/uL — AB (ref 1.7–7.7)
Neutrophils Relative %: 90 %
Platelets: 240 10*3/uL (ref 150–400)
RBC: 4.53 MIL/uL (ref 3.87–5.11)
RDW: 13.1 % (ref 11.5–15.5)
WBC: 16.6 10*3/uL — ABNORMAL HIGH (ref 4.0–10.5)

## 2017-10-15 LAB — I-STAT CG4 LACTIC ACID, ED
LACTIC ACID, VENOUS: 2.83 mmol/L — AB (ref 0.5–1.9)
LACTIC ACID, VENOUS: 0.97 mmol/L (ref 0.5–1.9)
Lactic Acid, Venous: 4.66 mmol/L (ref 0.5–1.9)

## 2017-10-15 LAB — RAPID URINE DRUG SCREEN, HOSP PERFORMED
Amphetamines: NOT DETECTED
BARBITURATES: NOT DETECTED
BENZODIAZEPINES: NOT DETECTED
COCAINE: NOT DETECTED
Opiates: NOT DETECTED
Tetrahydrocannabinol: NOT DETECTED

## 2017-10-15 LAB — LITHIUM LEVEL
LITHIUM LVL: 3.11 mmol/L — AB (ref 0.60–1.20)
Lithium Lvl: 2.56 mmol/L (ref 0.60–1.20)

## 2017-10-15 LAB — AMMONIA: Ammonia: 29 umol/L (ref 9–35)

## 2017-10-15 LAB — TSH: TSH: 5.615 u[IU]/mL — AB (ref 0.350–4.500)

## 2017-10-15 LAB — MAGNESIUM: MAGNESIUM: 2.7 mg/dL — AB (ref 1.7–2.4)

## 2017-10-15 LAB — CBG MONITORING, ED: Glucose-Capillary: 141 mg/dL — ABNORMAL HIGH (ref 65–99)

## 2017-10-15 LAB — CK: Total CK: 78 U/L (ref 38–234)

## 2017-10-15 LAB — ETHANOL

## 2017-10-15 MED ORDER — THIAMINE HCL 100 MG/ML IJ SOLN
100.0000 mg | Freq: Every day | INTRAMUSCULAR | Status: DC
  Administered 2017-10-15 – 2017-10-17 (×2): 100 mg via INTRAVENOUS
  Filled 2017-10-15 (×2): qty 2

## 2017-10-15 MED ORDER — LORAZEPAM 1 MG PO TABS: 0.0000 mg | ORAL_TABLET | Freq: Two times a day (BID) | ORAL | Status: DC

## 2017-10-15 MED ORDER — HEPARIN SODIUM (PORCINE) 5000 UNIT/ML IJ SOLN
5000.0000 [IU] | Freq: Three times a day (TID) | INTRAMUSCULAR | Status: DC
  Administered 2017-10-16 (×4): 5000 [IU] via SUBCUTANEOUS
  Filled 2017-10-15 (×6): qty 1

## 2017-10-15 MED ORDER — LORAZEPAM 2 MG/ML IJ SOLN: 0.0000 mg | Freq: Four times a day (QID) | INTRAMUSCULAR | Status: DC

## 2017-10-15 MED ORDER — SODIUM CHLORIDE 0.9 % IV BOLUS
2000.0000 mL | Freq: Once | INTRAVENOUS | Status: AC
  Administered 2017-10-15: 2000 mL via INTRAVENOUS

## 2017-10-15 MED ORDER — LORAZEPAM 2 MG/ML IJ SOLN: 0.0000 mg | Freq: Two times a day (BID) | INTRAMUSCULAR | Status: DC

## 2017-10-15 MED ORDER — ONDANSETRON HCL 4 MG PO TABS: 4.0000 mg | ORAL_TABLET | Freq: Four times a day (QID) | ORAL | Status: DC | PRN

## 2017-10-15 MED ORDER — ALBUTEROL SULFATE (2.5 MG/3ML) 0.083% IN NEBU: 2.5000 mg | INHALATION_SOLUTION | Freq: Four times a day (QID) | RESPIRATORY_TRACT | Status: DC | PRN

## 2017-10-15 MED ORDER — POTASSIUM CHLORIDE CRYS ER 20 MEQ PO TBCR
40.0000 meq | EXTENDED_RELEASE_TABLET | Freq: Once | ORAL | Status: AC
  Administered 2017-10-15: 40 meq via ORAL
  Filled 2017-10-15: qty 2

## 2017-10-15 MED ORDER — LORAZEPAM 1 MG PO TABS
0.0000 mg | ORAL_TABLET | Freq: Four times a day (QID) | ORAL | Status: DC
  Administered 2017-10-15: 1 mg via ORAL
  Filled 2017-10-15: qty 1

## 2017-10-15 MED ORDER — SODIUM CHLORIDE 0.9 % IV SOLN
INTRAVENOUS | Status: DC
  Administered 2017-10-16 – 2017-10-17 (×2): via INTRAVENOUS

## 2017-10-15 MED ORDER — VITAMIN B-1 100 MG PO TABS
100.0000 mg | ORAL_TABLET | Freq: Every day | ORAL | Status: DC
  Administered 2017-10-16: 100 mg via ORAL
  Filled 2017-10-15 (×2): qty 1

## 2017-10-15 MED ORDER — POTASSIUM CHLORIDE 10 MEQ/100ML IV SOLN
10.0000 meq | INTRAVENOUS | Status: AC
  Administered 2017-10-16 (×3): 10 meq via INTRAVENOUS
  Filled 2017-10-15 (×3): qty 100

## 2017-10-15 MED ORDER — ACETAMINOPHEN 650 MG RE SUPP: 650.0000 mg | Freq: Four times a day (QID) | RECTAL | Status: DC | PRN

## 2017-10-15 MED ORDER — ONDANSETRON HCL 4 MG/2ML IJ SOLN: 4.0000 mg | Freq: Four times a day (QID) | INTRAMUSCULAR | Status: DC | PRN

## 2017-10-15 MED ORDER — LORAZEPAM 1 MG PO TABS
1.0000 mg | ORAL_TABLET | Freq: Once | ORAL | Status: AC
  Administered 2017-10-15: 1 mg via ORAL
  Filled 2017-10-15: qty 1

## 2017-10-15 MED ORDER — MAGNESIUM SULFATE 2 GM/50ML IV SOLN
2.0000 g | Freq: Once | INTRAVENOUS | Status: AC
  Administered 2017-10-15: 2 g via INTRAVENOUS
  Filled 2017-10-15: qty 50

## 2017-10-15 MED ORDER — ACETAMINOPHEN 325 MG PO TABS: 650.0000 mg | ORAL_TABLET | Freq: Four times a day (QID) | ORAL | Status: DC | PRN

## 2017-10-15 NOTE — ED Notes (Signed)
Lithium 3.11

## 2017-10-15 NOTE — ED Provider Notes (Signed)
Megan Miranda   CSN: [id number] Arrival date & time: 10/15/17  1329     History   Chief Complaint Chief Complaint  Patient presents with  . off medications    HPI Megan Miranda is a 57 y.o. female with a pmh of bipolar 1 disorder, schizoaffective disorder, and history of alcohol abuse with withdrawal.  History is gathered from the patient's ex-husband who is at bedside.  There is a level 5 caveat due to psychiatric disorder.  Ex-husband states that the patient lives at [facility] and he was called by family because the patient had a witnessed fall outside.  Apparently the patient has also not taking any of her medications for the past week however a friend who lives at the facility gave her all of her daily medication at once today.  He states that she does drink 5-6 drinks nightly.  She has an extensive psychiatric history and they have been to [facility] and other facilities for treatment.  The patient took all of her daily meds today which was 1, 200 mg Lamictal 3, 300 mg tablets of lithium and 1 Prolixin tablet today.  The patient is notably tremulous.  HPI  Past Medical History:  Diagnosis Date  . Bipolar disorder [facility])     Patient Active Problem List   Diagnosis Date Noted  . BIPOLAR I DISORDER MOST RECENT EPISODE UNSPEC 07/21/2007    History reviewed. No pertinent surgical history.   OB History   None      Home Medications    Prior to Admission medications   Medication Sig Start Date End Date Taking? Authorizing Provider  fluPHENAZine (PROLIXIN) 1 MG tablet Take 1 tablet (1 mg total) by mouth 2 (two) times daily. 09/03/17  Yes Plovsky, Berneta Sages, MD  lamoTRIgine (LAMICTAL) 200 MG tablet Take 1 tablet (200 mg total) by mouth daily. 09/03/17  Yes Plovsky, Berneta Sages, MD  lithium 300 MG tablet 3   qhs 09/03/17  Yes Plovsky, Berneta Sages, MD    Family History No family history on file.  Social History Social History    Tobacco Use  . Smoking status: Current Some Day Smoker    Packs/day: 0.10    Years: 2.00    Pack years: 0.20  . Smokeless tobacco: Never Used  . Tobacco comment: Smokes a few cigarettes a week.   Substance Use Topics  . Alcohol use: No  . Drug use: No     Allergies   Soap   Review of Systems Review of Systems Unable to review systems secondary to psychiatric disorder  Physical Exam Updated Vital Signs BP 126/71 (BP Location: Left Arm)   Pulse (!) 102   Temp 98.2 F (36.8 C) (Oral)   Resp 16   SpO2 99%   Physical Exam  Constitutional: She appears well-developed.  Patient is unkempt, appears disheveled, extremely tremulous, eating ravenously and appears dehydrated.  HENT:  Head: Normocephalic and atraumatic.  Dry oral mucosa  Eyes: Pupils are equal, round, and reactive to light. Conjunctivae and EOM are normal.  Neck: Normal range of motion. Neck supple.  Cardiovascular: Regular rhythm and normal heart sounds.  Tachycardic  Pulmonary/Chest: Effort normal. She has no wheezes.  Abdominal: Soft. Bowel sounds are normal. She exhibits no distension. There is no tenderness.  Musculoskeletal: She exhibits no edema or deformity.  Neurological: She is alert.  Skin: Skin is warm. Capillary refill takes less than 2 seconds.  Poor skin turgor  Psychiatric: Her affect  is not inappropriate. Her speech is delayed. She is hyperactive and slowed. She is inattentive.  Nursing Miranda and vitals reviewed.    ED Treatments / Results  Labs (all labs ordered are listed, but only abnormal results are displayed) Labs Reviewed  CBG MONITORING, ED - Abnormal; Notable for the following components:      Result Value   Glucose-Capillary 141 (*)    All other components within normal limits  I-STAT CHEM 8, ED - Abnormal; Notable for the following components:   Sodium 128 (*)    Potassium 3.2 (*)    Chloride 98 (*)    Creatinine, Ser 2.00 (*)    Glucose, Bld 172 (*)    All other  components within normal limits  I-STAT CG4 LACTIC ACID, ED - Abnormal; Notable for the following components:   Lactic Acid, Venous 4.66 (*)    All other components within normal limits  CBC WITH DIFFERENTIAL/PLATELET  COMPREHENSIVE METABOLIC PANEL  URINALYSIS, ROUTINE W REFLEX MICROSCOPIC  AMMONIA  LITHIUM LEVEL  CK  CBG MONITORING, ED    EKG None  Radiology No results found.  Procedures Procedures (including critical care time)  Medications Ordered in ED Medications  LORazepam (ATIVAN) tablet 1 mg (has no administration in time range)     Initial Impression / Assessment and Plan / ED Course  I have reviewed the triage vital signs and the nursing notes.  Pertinent labs & imaging results that were available during my care of the patient were reviewed by me and considered in my medical decision making (see chart for details).  Clinical Course as of Oct 15 2244  Fri Oct 15, 2017  1712 Sodium(!): 128 [AH]  1712 Serum corrected sodium is 129/120   Sodium(!): 128 [AH]  1743 Repeat lithium level, ecg ,and chem panel at4 hours   [AH]  1915 TSH(!): 5.615 [AH]    Clinical Course User Index [AH] Margarita Mail, PA-C    Patient with lithium toxicity, states elevated suggestive of diagnosis of hypothyroidism.  Market dehydration.  Her lactic acid levels have improved.  EKG is unchanged.  No QT prolongation or other abnormalities noted.  Patient has been observed in the ER with fluid rehydration over 4 hours.  Her repeat lithium level is trending down which means she will not need dialysis.  Patient will be admitted to the hospitalist service.  Her magnesium level is low.  Potassium level is still low although I did order oral repletion and she may need more. \ Patientwas attended by her ex-husband who is the only family member in town Megan Miranda [phone]; patient's brother is Megan Miranda [phone]  Final Clinical Impressions(s) / ED Diagnoses   Final diagnoses:    Dehydration, moderate  Lithium toxicity, accidental or unintentional, initial encounter    ED Discharge Orders    None       Margarita Mail, PA-C 10/15/17 2257    Malvin Johns, MD 10/18/17 [phone]

## 2017-10-15 NOTE — ED Notes (Signed)
Elevated lactic reported to Catalina Antigua, RN and Tamera Punt, MD

## 2017-10-15 NOTE — H&P (Addendum)
History and Physical    Megan Miranda TGG:[id number] DOB: 08/09/1960 DOA: 10/15/2017  Referring MD/NP/PA: Margarita Mail, PA-C PCP: Patient, No Pcp Per  Patient coming from: via EMS  Chief Complaint: Altered mental status I have personally briefly reviewed patient's old medical records in [place]   HPI: Megan Miranda is a 57 y.o. female with medical history significant of extensive psychiatric history including bipolar 1 disorder, schizoaffective disorder, and history of alcohol abuse; who presents from home via EMS after being found altered by her ex-husband after having a fall.  Patient is a poor historian at this time and history is obtained from review of records.  Patient apparently lives at [facility], and he was called by family members after patient had a witnessed fall outside.  It was reported that she had not been taking any of her medications for the past week, but a friend reportedly gave her all of her medications at once today including 1, 200 mg Lamictal 3, 300 mg of lithium, and 1 Prolixin tablet. Patient states that she may have been taking more of certain medications and not enough of others.  She admits to falling and sustaining a bruise to her right knee.  Patient reports having a "heat stroke" from being outside 30 minutes today.  She reports being followed by psychiatrist Dr. Norma Fredrickson in outpatient setting.  ED Course: Upon admission to the emergency department patient was seen to have a temperature of 100 F, pulse 72-102, respiration 15-22, blood pressure 90/50 126/71, O2 saturations 96-100% on room air.  Labs revealed WBC 16, lithium level 3.11, lactic acid 4.66, potassium 3, BUN 13, creatinine 2.  UDS was negative.  Chest x-ray and urinalysis did not show any significant signs of infection.  Poison control was contacted recommending aggressive IV fluid hydration and serial lithium and electrolyte monitoring.  Patient was given 2 L of normal saline IV  fluids, 40 mEq of potassium chloride, and placed on CIWA protocol.  Repeat   revealed lactic acid down to 2.83 and lithium level down to 2.56.   Review of Systems  Unable to perform ROS: Psychiatric disorder  Musculoskeletal: Positive for falls and joint pain.  Psychiatric/Behavioral: Negative for suicidal ideas.    Past Medical History:  Diagnosis Date  . Bipolar disorder ([place])     History reviewed. No pertinent surgical history.   reports that she has been smoking.  She has a 0.20 pack-year smoking history. She has never used smokeless tobacco. She reports that she does not drink alcohol or use drugs.  Allergies  Allergen Reactions  . Soap Rash    No family history on file.  Prior to Admission medications   Medication Sig Start Date End Date Taking? Authorizing Provider  fluPHENAZine (PROLIXIN) 1 MG tablet Take 1 tablet (1 mg total) by mouth 2 (two) times daily. 09/03/17  Yes Plovsky, Berneta Sages, MD  lamoTRIgine (LAMICTAL) 200 MG tablet Take 1 tablet (200 mg total) by mouth daily. 09/03/17  Yes Plovsky, Berneta Sages, MD  lithium 300 MG tablet 3   qhs 09/03/17  Yes Norma Fredrickson, MD    Physical Exam:  Constitutional: Disheveled female who appears very tremulous and able to follow commands. Vitals:   10/15/17 1800 10/15/17 1830 10/15/17 1929 10/15/17 2140  BP: 114/70 111/63 118/61 106/60  Pulse: 73  83 72  Resp: 15 17 18  (!) 22  Temp:      TempSrc:      SpO2: 100%  100% 96%  Weight:       Eyes: PERRL, lids and conjunctivae normal ENMT: Mucous membranes are moist. Posterior pharynx clear of any exudate or lesions.Normal dentition.  Neck: normal, supple, no masses, no thyromegaly Respiratory: clear to auscultation bilaterally, no wheezing, no crackles. Normal respiratory effort. No accessory muscle use.  Cardiovascular: Regular rate and rhythm, no murmurs / rubs / gallops. No extremity edema. 2+ pedal pulses. No carotid bruits.  Abdomen: no tenderness, no masses palpated. No  hepatosplenomegaly. Bowel sounds positive.  Musculoskeletal: no clubbing / cyanosis. No joint deformity upper and lower extremities. Good ROM, no contractures. Normal muscle tone.  Skin: no rashes, lesions, ulcers. No induration Neurologic: CN 2-12 grossly intact. Sensation intact, DTR normal. Strength 5/5 in all 4.  Patient tremulous Psychiatric: Poor insight and judgment.  Patient    Labs on Admission: I have personally reviewed following labs and imaging studies  CBC: Recent Labs  Lab 10/15/17 1502 10/15/17 1533  WBC 16.6*  --   NEUTROABS 14.9*  --   HGB 15.1* 15.0  HCT 42.0 44.0  MCV 92.7  --   PLT 240  --    Basic Metabolic Panel: Recent Labs  Lab 10/15/17 1533 10/15/17 1625 10/15/17 1941  NA 128* 130* 134*  K 3.2* 3.0* 3.0*  CL 98* 102 105  CO2  --  20* 20*  GLUCOSE 172* 116* 129*  BUN 13 13 13   CREATININE 2.00* 1.86* 2.00*  CALCIUM  --  9.3 8.6*  MG  --  2.7*  --    GFR: CrCl cannot be calculated (Unknown ideal weight.). Liver Function Tests: Recent Labs  Lab 10/15/17 1625 10/15/17 1941  AST 33 32  ALT 34 28  ALKPHOS 86 79  BILITOT 0.7 0.4  PROT 6.3* 5.6*  ALBUMIN 3.7 3.4*   No results for input(s): LIPASE, AMYLASE in the last 168 hours. Recent Labs  Lab 10/15/17 1625  AMMONIA 29   Coagulation Profile: No results for input(s): INR, PROTIME in the last 168 hours. Cardiac Enzymes: Recent Labs  Lab 10/15/17 1625  CKTOTAL 78   BNP (last 3 results) No results for input(s): PROBNP in the last 8760 hours. HbA1C: No results for input(s): HGBA1C in the last 72 hours. CBG: Recent Labs  Lab 10/15/17 1525  GLUCAP 141*   Lipid Profile: No results for input(s): CHOL, HDL, LDLCALC, TRIG, CHOLHDL, LDLDIRECT in the last 72 hours. Thyroid Function Tests: Recent Labs    10/15/17 1523  TSH 5.615*   Anemia Panel: No results for input(s): VITAMINB12, FOLATE, FERRITIN, TIBC, IRON, RETICCTPCT in the last 72 hours. Urine analysis:    Component  Value Date/Time   COLORURINE STRAW (A) 10/15/2017 1920   APPEARANCEUR CLEAR 10/15/2017 1920   LABSPEC 1.003 (L) 10/15/2017 1920   PHURINE 7.0 10/15/2017 1920   GLUCOSEU NEGATIVE 10/15/2017 1920   HGBUR NEGATIVE 10/15/2017 1920   BILIRUBINUR NEGATIVE 10/15/2017 1920   KETONESUR NEGATIVE 10/15/2017 1920   PROTEINUR NEGATIVE 10/15/2017 1920   NITRITE NEGATIVE 10/15/2017 1920   LEUKOCYTESUR NEGATIVE 10/15/2017 1920   Sepsis Labs: No results found for this or any previous visit (from the past 240 hour(s)).   Radiological Exams on Admission: Dg Chest 2 View  Result Date: 10/15/2017 CLINICAL DATA:  Heat prostration with altered mental status EXAM: CHEST - 2 VIEW COMPARISON:  None. FINDINGS: The lungs are clear. The heart size and pulmonary vascularity are normal. No adenopathy. No bone lesions. IMPRESSION: No edema or consolidation. Electronically Signed   By: Lowella Grip  III M.D.   On: 10/15/2017 21:01   Ct Head Wo Contrast  Result Date: 10/15/2017 CLINICAL DATA:  Questionable fall.  Altered mental status EXAM: CT HEAD WITHOUT CONTRAST TECHNIQUE: Contiguous axial images were obtained from the base of the skull through the vertex without intravenous contrast. COMPARISON:  None. FINDINGS: Brain: There is mild diffuse atrophy. There is no intracranial mass, hemorrhage, extra-axial fluid collection, or midline shift. There is mild small vessel disease in the centra semiovale bilaterally. Elsewhere gray-white compartments appear normal. No evident acute infarct. Vascular: No appreciable hyperdense vessel. There is calcification in both carotid siphon regions. Skull: Bony calvarium appears intact. Sinuses/Orbits: Visualized paranasal sinuses are clear. Visualized orbits appear symmetric bilaterally. Other: Mastoid air cells are clear. IMPRESSION: Atrophy with slight periventricular small vessel disease. No mass or hemorrhage. No evident acute infarct. There are foci of vascular calcification.  Electronically Signed   By: Lowella Grip III M.D.   On: 10/15/2017 15:58    EKG: Independently reviewed.  Normal sinus rhythm at 89 bpm  Assessment/Plan Lithium toxicity: Acute.  Patient presented with an initial lithium level of 3.11.  Poison control contacted recommending aggressive fluid hydration as tolerated, serial monitoring of electrolytes, and neurological changes/seizure activity.  Patient initially was given 2 L of normal saline IV fluids.  Repeat check of patient's lithium noted to be down to 2.56. - Admit to stepdown bed - Continuous pulse oximetry with nasal cannula oxygen as needed - Neurochecks every 4 hours - Strict I&Os  - Normal saline at 150 mL/h - Add on salicylate, and acetaminophen level - Check BMPs every 4 hours for lithium-induced nephrogenic diabetes insipidus  - Correct electrolytes as needed  SIRS, lactic acidosis:Acute.  Patient presents tachycardic and tachypneic with WBC 16.6 and initial lactic acid 4.66, but noted to be trending down after IV fluids.  Urinalysis and chest x-ray showed no acute abnormality.  Blood cultures were obtained.  Suspect secondary to above most likely infectious in nature. - Follow-up blood cultures - Trend lactic acid levels   Suspect acute kidney injury: Patient's baseline kidney function unknown, but she presents with a creatinine of 2.   - Aggressive IV fluids as tolerated - Continue to monitor kidney function  History of alcohol abuse: Clear at this time when patient's last drink was. - Add on ethanol level - Continue CIWA protocol  History of schizoaffective disorder and bipolar 1 disorder - May want to contact patient's psychiatrist Dr. Norma Fredrickson in a.m. regarding patient and medications  Abnormal TSH: TSH was noted to be 5.615 admission. -   Check free T4  DVT prophylaxis: Heparin Code Status: Full Family Communication: No family at bedside Disposition Plan: To be determined Consults called:  None Admission status: Inpatient  Norval Morton MD [facility] Pager [phone]   If 7PM-7AM, please contact night-coverage [email or web address] Password [facility]  10/15/2017, 10:02 PM

## 2017-10-15 NOTE — ED Triage Notes (Signed)
Per [facility] pt comes from home for being off her psych meds for 2-3 weeks. Pt was found laying on ground at home with no LOC. Ex husband was talking about sometime next week taking pt to [facility] to see her psych doctors.  Vitals: 132/72, 90HR, 97% on room air, CBG 205.

## 2017-10-15 NOTE — ED Notes (Signed)
Poison control called for update.  Recommend additional repeat lithium level

## 2017-10-15 NOTE — ED Triage Notes (Signed)
Pt states that she was outside for 6 hours and had heat exhaustion. States that she got really hot and went inside her house where she fell but was able to get up by herself.  Pt very restless in chair in triage room.  Denies SI or HI.

## 2017-10-15 NOTE — ED Notes (Signed)
Date and time results received: 10/15/17 2113 (use smartphrase ".now" to insert current time)  Test: Lithium Critical Value: 2.56  Name of Provider Notified: Kenton Kingfisher MD  Orders Received? Or Actions Taken?: Orders Received - See Orders for details

## 2017-10-15 NOTE — ED Notes (Signed)
Bed: [facility] Expected date:  Expected time:  Means of arrival:  Comments: 

## 2017-10-15 NOTE — ED Notes (Signed)
Bed: [facility]PT4 Expected date:  Expected time:  Means of arrival:  Comments: 

## 2017-10-16 ENCOUNTER — Encounter (HOSPITAL_COMMUNITY): Payer: Self-pay | Admitting: *Deleted

## 2017-10-16 ENCOUNTER — Other Ambulatory Visit: Payer: Self-pay

## 2017-10-16 DIAGNOSIS — F1011 Alcohol abuse, in remission: Secondary | ICD-10-CM

## 2017-10-16 DIAGNOSIS — R4182 Altered mental status, unspecified: Secondary | ICD-10-CM

## 2017-10-16 DIAGNOSIS — F1010 Alcohol abuse, uncomplicated: Secondary | ICD-10-CM

## 2017-10-16 DIAGNOSIS — R6510 Systemic inflammatory response syndrome (SIRS) of non-infectious origin without acute organ dysfunction: Secondary | ICD-10-CM | POA: Diagnosis present

## 2017-10-16 DIAGNOSIS — F250 Schizoaffective disorder, bipolar type: Secondary | ICD-10-CM

## 2017-10-16 DIAGNOSIS — F17210 Nicotine dependence, cigarettes, uncomplicated: Secondary | ICD-10-CM

## 2017-10-16 DIAGNOSIS — R7989 Other specified abnormal findings of blood chemistry: Secondary | ICD-10-CM | POA: Diagnosis present

## 2017-10-16 DIAGNOSIS — E872 Acidosis, unspecified: Secondary | ICD-10-CM | POA: Diagnosis present

## 2017-10-16 LAB — BASIC METABOLIC PANEL
ANION GAP: 5 (ref 5–15)
BUN: 13 mg/dL (ref 6–20)
CALCIUM: 8.7 mg/dL — AB (ref 8.9–10.3)
CO2: 18 mmol/L — AB (ref 22–32)
CREATININE: 1.67 mg/dL — AB (ref 0.44–1.00)
Chloride: 117 mmol/L — ABNORMAL HIGH (ref 101–111)
GFR, EST AFRICAN AMERICAN: 38 mL/min — AB (ref >60)
GFR, EST NON AFRICAN AMERICAN: 33 mL/min — AB (ref >60)
GLUCOSE: 156 mg/dL — AB (ref 65–99)
Potassium: 4.1 mmol/L (ref 3.5–5.1)
Sodium: 140 mmol/L (ref 135–145)

## 2017-10-16 LAB — CBC WITH DIFFERENTIAL/PLATELET
BASOS ABS: 0 10*3/uL (ref 0.0–0.1)
BASOS PCT: 0 %
EOS ABS: 0.1 10*3/uL (ref 0.0–0.7)
EOS PCT: 1 %
HCT: 34.1 % — ABNORMAL LOW (ref 36.0–46.0)
Hemoglobin: 11.6 g/dL — ABNORMAL LOW (ref 12.0–15.0)
Lymphocytes Relative: 20 %
Lymphs Abs: 1.8 10*3/uL (ref 0.7–4.0)
MCH: 32.6 pg (ref 26.0–34.0)
MCHC: 34 g/dL (ref 30.0–36.0)
MCV: 95.8 fL (ref 78.0–100.0)
MONO ABS: 0.7 10*3/uL (ref 0.1–1.0)
Monocytes Relative: 8 %
Neutro Abs: 6.2 10*3/uL (ref 1.7–7.7)
Neutrophils Relative %: 71 %
PLATELETS: 184 10*3/uL (ref 150–400)
RBC: 3.56 MIL/uL — ABNORMAL LOW (ref 3.87–5.11)
RDW: 13.5 % (ref 11.5–15.5)
WBC: 8.8 10*3/uL (ref 4.0–10.5)

## 2017-10-16 LAB — BASIC METABOLIC PANEL WITH GFR
Anion gap: 6 (ref 5–15)
Anion gap: 5 (ref 5–15)
BUN: 15 mg/dL (ref 6–20)
BUN: 14 mg/dL (ref 6–20)
CO2: 19 mmol/L — ABNORMAL LOW (ref 22–32)
CO2: 19 mmol/L — ABNORMAL LOW (ref 22–32)
Calcium: 8.8 mg/dL — ABNORMAL LOW (ref 8.9–10.3)
Calcium: 8.9 mg/dL (ref 8.9–10.3)
Chloride: 111 mmol/L (ref 101–111)
Chloride: 116 mmol/L — ABNORMAL HIGH (ref 101–111)
Creatinine, Ser: 1.86 mg/dL — ABNORMAL HIGH (ref 0.44–1.00)
Creatinine, Ser: 1.85 mg/dL — ABNORMAL HIGH (ref 0.44–1.00)
GFR calc Af Amer: 33 mL/min — ABNORMAL LOW
GFR calc Af Amer: 34 mL/min — ABNORMAL LOW
GFR calc non Af Amer: 29 mL/min — ABNORMAL LOW
GFR calc non Af Amer: 29 mL/min — ABNORMAL LOW
Glucose, Bld: 102 mg/dL — ABNORMAL HIGH (ref 65–99)
Glucose, Bld: 92 mg/dL (ref 65–99)
Potassium: 4.5 mmol/L (ref 3.5–5.1)
Potassium: 4.5 mmol/L (ref 3.5–5.1)
Sodium: 136 mmol/L (ref 135–145)
Sodium: 140 mmol/L (ref 135–145)

## 2017-10-16 LAB — HEPATIC FUNCTION PANEL
ALT: 27 U/L (ref 14–54)
AST: 22 U/L (ref 15–41)
Albumin: 3.3 g/dL — ABNORMAL LOW (ref 3.5–5.0)
Alkaline Phosphatase: 77 U/L (ref 38–126)
BILIRUBIN DIRECT: 0.1 mg/dL (ref 0.1–0.5)
BILIRUBIN INDIRECT: 0.5 mg/dL (ref 0.3–0.9)
BILIRUBIN TOTAL: 0.6 mg/dL (ref 0.3–1.2)
TOTAL PROTEIN: 5.6 g/dL — AB (ref 6.5–8.1)

## 2017-10-16 LAB — HIV ANTIBODY (ROUTINE TESTING W REFLEX): HIV Screen 4th Generation wRfx: NONREACTIVE

## 2017-10-16 LAB — SALICYLATE LEVEL: Salicylate Lvl: <7 mg/dL (ref 2.8–30.0)

## 2017-10-16 LAB — MAGNESIUM
Magnesium: 3 mg/dL — ABNORMAL HIGH (ref 1.7–2.4)
Magnesium: 2.6 mg/dL — ABNORMAL HIGH (ref 1.7–2.4)

## 2017-10-16 LAB — LITHIUM LEVEL
Lithium Lvl: 1.64 mmol/L (ref 0.60–1.20)
Lithium Lvl: 2.19 mmol/L (ref 0.60–1.20)
Lithium Lvl: 2.38 mmol/L (ref 0.60–1.20)

## 2017-10-16 LAB — ACETAMINOPHEN LEVEL: Acetaminophen (Tylenol), Serum: <10 ug/mL — ABNORMAL LOW (ref 10–30)

## 2017-10-16 LAB — ETHANOL

## 2017-10-16 LAB — PHOSPHORUS: PHOSPHORUS: 1.4 mg/dL — AB (ref 2.5–4.6)

## 2017-10-16 LAB — T4, FREE: Free T4: 0.83 ng/dL (ref 0.82–1.77)

## 2017-10-16 LAB — LACTIC ACID, PLASMA
Lactic Acid, Venous: 1 mmol/L (ref 0.5–1.9)
Lactic Acid, Venous: 1.2 mmol/L (ref 0.5–1.9)

## 2017-10-16 LAB — MRSA PCR SCREENING: MRSA BY PCR: NEGATIVE

## 2017-10-16 MED ORDER — K PHOS MONO-SOD PHOS DI & MONO 155-852-130 MG PO TABS
500.0000 mg | ORAL_TABLET | Freq: Three times a day (TID) | ORAL | Status: AC
  Administered 2017-10-16 – 2017-10-17 (×3): 500 mg via ORAL
  Filled 2017-10-16 (×3): qty 2

## 2017-10-16 NOTE — Progress Notes (Signed)
Notified Dr. Alfredia Ferguson of lithium level, also asked the doctor if she could transfer upstairs.  No response from page to the doctor.  Continue to monitor closely.  Iverna Hammac Roselie Awkward RN

## 2017-10-16 NOTE — Progress Notes (Signed)
CRITICAL VALUE ALERT  Critical Value:  Lithium level :1.64  Date & Time Notied:  10/16/2017 1500  Provider Notified: Dr. Alfredia Ferguson  Orders Received/Actions taken:  No new orders currently

## 2017-10-16 NOTE — Progress Notes (Signed)
Walked to bathroom and then walked in the hall for 200 feet.  Tolerated well.  Continue to monitor for withdrawls, and lithium overdose.  Halen Mossbarger Roselie Awkward RN

## 2017-10-16 NOTE — Progress Notes (Signed)
PROGRESS NOTE    Megan Miranda  QRF:[id number] DOB: 27-Feb-1961 DOA: 10/15/2017 PCP: Patient, No Pcp Per   Brief Narrative:  Megan Miranda is a 57 y.o. female with medical history significant of extensive psychiatric history including bipolar 1 disorder, schizoaffective disorder, and history of alcohol abuse and other comorbids who presented from home via EMS after being found altered by her ex-husband after having a fall.  Patient is a poor historian and history was obtained from review of records.  Patient apparently lives at [facility], and he was called by family members after patient had a witnessed fall outside.  It was reported that she had not been taking any of her medications for the past week, but a friend reportedly gave her all of her medications at once today including 1,200 mg Lamictal 3,300 mg of lithium, and 1 Prolixin tablet. Patient states that she may have been taking more of certain medications and not enough of others.  She admits to falling and sustaining a bruise to her right knee.  Patient reported to having a "heat stroke" from being outside 30 minutes today.  She reports being followed by psychiatrist Dr. Norma Fredrickson in outpatient setting.  Upon admission to the emergency department patient labs revealed WBC 16, lithium level 3.11, lactic acid 4.66, potassium 3, BUN 13, creatinine 2.  UDS was negative.  Chest x-ray and urinalysis did not show any significant signs of infection.  Poison control was contacted recommending aggressive IV fluid hydration and serial lithium and electrolyte monitoring.  Patient was given 2 L of normal saline IV fluids, 40 mEq of potassium chloride, and placed on CIWA protocol.  Patient was admitted for Acute Lithium Toxicity and Psychiatry has been called for further evaluation recommendations.   Assessment & Plan:   Principal Problem:   Lithium toxicity Active Problems:   Bipolar I disorder (HCC)   SIRS (systemic inflammatory response  syndrome) (HCC)   Lactic acidosis   Abnormal TSH   History of alcohol abuse  Acute Lithium Toxicity -Patient presented with an initial lithium level of 3.11.   -Poison control contacted recommending aggressive fluid hydration as tolerated, serial monitoring of electrolytes, and neurological changes/seizure activity.   -Patient initially was given 2 L of normal saline IV fluids.   -Repeat check of patient's lithium noted to be down to 2.56 -> 2.38 -> 2.19 -Admitted to SDU -Continuous pulse oximetry with nasal cannula oxygen as needed -Neurochecks every 4 hours -Strict I&Os  -C/w IVF Hydration with Normal saline at 150 mL/hr for now and start reducing IVF this afternoon to 75 mL/hr  -Acetaminophen Level was <82 and Salicylate Level was <6.4 -Check BMPs every 4 hours for lithium-Induced Nephrogenic Diabetes Insipidus along with Strict I's/O's -Correct Electrolytes as needed -Have consulted Psychiatry for medication adjustment and evaluation  SIRS, improving -Acute. Patient presented with tachycardia and tachypnea with WBC 16.6 and initial lactic acid 4.66, but noted to be trending down after IV fluids.   -Urinalysis and chest x-ray showed no acute abnormality.   -Blood cultures were obtained.   -Suspect secondary to above and less likely infectious in nature. -Follow-up blood cultures and showed NGTD <24 hours in 1 out of 2 Cx sets with the other pending -Trended lactic acid levels  -WBC is now 8.8 -Repeat CBC in AM   Suspect Acute Kidney Injury on ?CKD Stage 3/4 -Patient's baseline kidney function unknown, but she presents with a creatinine of 2.   -BUN/Cr went from 13/2.00 -> 15/1.86 ->  14/1.85  Aggressive IV fluids as tolerated - Continue to monitor kidney function  History of Alcohol Abuse  -Unclear at this time when patient's last drink was but she states it was last week - Add on ethanol level - Continue CIWA protocol  History of Schizoaffective Disorder and Bipolar 1  Disorder -Sees Psychiatrist Dr. Norma Fredrickson in the outpatient setting -Have consulted Inpatient Psychiatry for evaluation and Recommendations   Abnormal TSH -TSH was noted to be 5.615 admission. -Checked Free T4 and was 0.83 -Repeat Thyroid Studies as an outpatient   Hypermagnesemia -Patient magnesium level this morning was 3.0 -Patient had received 2 g of IV mag sulfate yesterday -Continue to monitor and repeat magnesium level in the a.m.  HypoPhosphatemia -Patient's phosphorus level was 1.4 this morning -We will replete with p.o. K-Phos Neutral 500 mg 3 times daily x3 doses -Continue to monitor and replete as necessary -Repeat Phos in the a.m.  Hypokalemia, improved -Patient's potassium on admission was 3.0 and has improved to 4.5 -Continue to monitor and replete as necessary -Repeat CMP in a.m.  Normocytic Anemia -Patient's hemoglobin/hematocrit on admission went from 15.0/44.0 -> 11.6/34.1 -Check Anemia Panel -Likely Dilutional Drop and patient was suspected to be Hemoconcentrated on Admission  -Continue to monitor for signs and symptoms of bleeding -Repeat CBC in a.m.  Lactic Acidosis, improved  -Improved.   -Given 2 L of IV fluid boluses and started on normal saline at a rate of 150 mL's per hour -Patient's lactic acid level went from 4.66 and is now 1.2 -Continue with IV fluid hydration as above and will not check lactic acid levels again  DVT prophylaxis: Heparin 5,000 sq q8h Code Status: FULL CODE Family Communication: No family present at bedside Disposition Plan: Remain Inpatient for further workup and evaluation  Consultants:   Psychiatry    Procedures:  None   Antimicrobials:  Anti-infectives (From admission, onward)   None     Subjective: Seen and examined this morning and was a little tangential.  States she is feeling much better than yesterday.  No chest pain, shortness breath, nausea, vomiting.  Wanting to know when she could go  home.  Objective: Vitals:   10/16/17 0415 10/16/17 0500 10/16/17 0600 10/16/17 0744  BP:  (!) 73/39 (!) 98/58   Pulse: 70 70 82   Resp: 19 (!) 23 20   Temp:    98.4 F (36.9 C)  TempSrc:    Axillary  SpO2: 100% 100% 95%   Weight:        Intake/Output Summary (Last 24 hours) at 10/16/2017 0802 Last data filed at 10/15/2017 2108 Gross per 24 hour  Intake 2050 ml  Output -  Net 2050 ml   Filed Weights   10/15/17 1548  Weight: 72.6 kg (160 lb)   Examination: Physical Exam:  Constitutional: WN/WD Caucasian female in NAD and appears calm and comfortable Eyes: Lids and conjunctivae normal, sclerae anicteric  ENMT: External Ears, Nose appear normal. Grossly normal hearing. Mucous membranes are moist.  Neck: Appears normal, supple, no cervical masses, normal ROM, no appreciable thyromegaly, no JVD Respiratory: Diminished to auscultation bilaterally, no wheezing, rales, rhonchi or crackles. Slightly tachypenic. No accessory muscle use.  Cardiovascular: RRR, no murmurs / rubs / gallops. S1 and S2 auscultated. No extremity edema. Abdomen: Soft, non-tender, non-distended. No masses palpated. No appreciable hepatosplenomegaly. Bowel sounds positive x4.  GU: Deferred. Musculoskeletal: No clubbing / cyanosis of digits/nails. No joint deformity upper and lower extremities. Skin: No rashes, lesions, ulcers on  a limited skin eval. No induration; Warm and dry.  Neurologic: CN 2-12 grossly intact with no focal deficits. Had some tremors with Left worse than Right Psychiatric: Normal judgment and insight. Alert and Awake. Anxious appearing mood and appropriate affect.   Data Reviewed: I have personally reviewed following labs and imaging studies  CBC: Recent Labs  Lab 10/15/17 1502 10/15/17 1533  WBC 16.6*  --   NEUTROABS 14.9*  --   HGB 15.1* 15.0  HCT 42.0 44.0  MCV 92.7  --   PLT 240  --    Basic Metabolic Panel: Recent Labs  Lab 10/15/17 1533 10/15/17 1625 10/15/17 1941  10/16/17 0333  NA 128* 130* 134* 136  K 3.2* 3.0* 3.0* 4.5  CL 98* 102 105 111  CO2  --  20* 20* 19*  GLUCOSE 172* 116* 129* 102*  BUN 13 13 13 15   CREATININE 2.00* 1.86* 2.00* 1.86*  CALCIUM  --  9.3 8.6* 8.8*  MG  --  2.7*  --   --    GFR: CrCl cannot be calculated (Unknown ideal weight.). Liver Function Tests: Recent Labs  Lab 10/15/17 1625 10/15/17 1941  AST 33 32  ALT 34 28  ALKPHOS 86 79  BILITOT 0.7 0.4  PROT 6.3* 5.6*  ALBUMIN 3.7 3.4*   No results for input(s): LIPASE, AMYLASE in the last 168 hours. Recent Labs  Lab 10/15/17 1625  AMMONIA 29   Coagulation Profile: No results for input(s): INR, PROTIME in the last 168 hours. Cardiac Enzymes: Recent Labs  Lab 10/15/17 1625  CKTOTAL 78   BNP (last 3 results) No results for input(s): PROBNP in the last 8760 hours. HbA1C: No results for input(s): HGBA1C in the last 72 hours. CBG: Recent Labs  Lab 10/15/17 1525  GLUCAP 141*   Lipid Profile: No results for input(s): CHOL, HDL, LDLCALC, TRIG, CHOLHDL, LDLDIRECT in the last 72 hours. Thyroid Function Tests: Recent Labs    10/15/17 1523  TSH 5.615*   Anemia Panel: No results for input(s): VITAMINB12, FOLATE, FERRITIN, TIBC, IRON, RETICCTPCT in the last 72 hours. Sepsis Labs: Recent Labs  Lab 10/15/17 2009 10/15/17 2221 10/16/17 0333 10/16/17 0548  LATICACIDVEN 2.83* 0.97 1.0 1.2    Recent Results (from the past 240 hour(s))  MRSA PCR Screening     Status: None   Collection Time: 10/16/17 12:43 AM  Result Value Ref Range Status   MRSA by PCR NEGATIVE NEGATIVE Final    Comment:        The GeneXpert MRSA Assay (FDA approved for NASAL specimens only), is one component of a comprehensive MRSA colonization surveillance program. It is not intended to diagnose MRSA infection nor to guide or monitor treatment for MRSA infections. Performed at [facility], [place] [address], [place], [place] [address]     Radiology  Studies: Dg Chest 2 View  Result Date: 10/15/2017 CLINICAL DATA:  Heat prostration with altered mental status EXAM: CHEST - 2 VIEW COMPARISON:  None. FINDINGS: The lungs are clear. The heart size and pulmonary vascularity are normal. No adenopathy. No bone lesions. IMPRESSION: No edema or consolidation. Electronically Signed   By: Lowella Grip III M.D.   On: 10/15/2017 21:01   Ct Head Wo Contrast  Result Date: 10/15/2017 CLINICAL DATA:  Questionable fall.  Altered mental status EXAM: CT HEAD WITHOUT CONTRAST TECHNIQUE: Contiguous axial images were obtained from the base of the skull through the vertex without intravenous contrast. COMPARISON:  None. FINDINGS: Brain: There is  mild diffuse atrophy. There is no intracranial mass, hemorrhage, extra-axial fluid collection, or midline shift. There is mild small vessel disease in the centra semiovale bilaterally. Elsewhere gray-white compartments appear normal. No evident acute infarct. Vascular: No appreciable hyperdense vessel. There is calcification in both carotid siphon regions. Skull: Bony calvarium appears intact. Sinuses/Orbits: Visualized paranasal sinuses are clear. Visualized orbits appear symmetric bilaterally. Other: Mastoid air cells are clear. IMPRESSION: Atrophy with slight periventricular small vessel disease. No mass or hemorrhage. No evident acute infarct. There are foci of vascular calcification. Electronically Signed   By: Lowella Grip III M.D.   On: 10/15/2017 15:58   Scheduled Meds: . heparin  5,000 Units Subcutaneous Q8H  . LORazepam  0-4 mg Intravenous Q6H   Or  . LORazepam  0-4 mg Oral Q6H  . [START ON 10/18/2017] LORazepam  0-4 mg Intravenous Q12H   Or  . [START ON 10/18/2017] LORazepam  0-4 mg Oral Q12H  . thiamine  100 mg Oral Daily   Or  . thiamine  100 mg Intravenous Daily   Continuous Infusions: . sodium chloride 150 mL/hr at 10/16/17 0112    LOS: 1 day   Kerney Elbe, DO [facility] Pager  [phone]  If 7PM-7AM, please contact night-coverage [email or web address] Password [email or web address] 10/16/2017, 8:02 AM

## 2017-10-16 NOTE — ED Notes (Signed)
ED TO INPATIENT HANDOFF REPORT  Name/Age/Gender Megan Miranda 57 y.o. female  Code Status    Code Status Orders  (From admission, onward)        Start     Ordered   10/15/17 2258  Full code  Continuous     10/15/17 2302    Code Status History    This patient has a current code status but no historical code status.      Home/SNF/Other Home  Chief Complaint medical clearance  Level of Care/Admitting Diagnosis ED Disposition    ED Disposition Condition Comment   Admit  Hospital Area: [facility] [[id number]]  Level of Care: Stepdown [14]  Admit to SDU based on following criteria: Severe physiological/psychological symptoms:  Any diagnosis requiring assessment & intervention at least every 4 hours on an ongoing basis to obtain desired patient outcomes including stability and rehabilitation  Diagnosis: Lithium toxicity, undetermined intent, initial encounter [[id number]]  Admitting Physician: Norval Morton [[id number]]  Attending Physician: Norval Morton [[id number]]  Estimated length of stay: 3 - 4 days  Certification:: I certify this patient will need inpatient services for at least 2 midnights  PT Class (Do Not Modify): Inpatient [101]  PT Acc Code (Do Not Modify): Private [1]       Medical History Past Medical History:  Diagnosis Date  . Bipolar disorder (HCC)     Allergies Allergies  Allergen Reactions  . Soap Rash    IV Location/Drains/Wounds Patient Lines/Drains/Airways Status   Active Line/Drains/Airways    Name:   Placement date:   Placement time:   Site:   Days:   Peripheral IV 10/15/17 Right Antecubital   10/15/17    1535    Antecubital   1   Peripheral IV 10/15/17 Right Wrist   10/15/17    1623    Wrist   1          Labs/Imaging Results for orders placed or performed during the hospital encounter of 10/15/17 (from the past 48 hour(s))  CBC with Differential     Status: Abnormal   Collection Time: 10/15/17  3:02 PM  Result  Value Ref Range   WBC 16.6 (H) 4.0 - 10.5 K/uL   RBC 4.53 3.87 - 5.11 MIL/uL   Hemoglobin 15.1 (H) 12.0 - 15.0 g/dL   HCT 42.0 36.0 - 46.0 %   MCV 92.7 78.0 - 100.0 fL   MCH 33.3 26.0 - 34.0 pg   MCHC 36.0 30.0 - 36.0 g/dL   RDW 13.1 11.5 - 15.5 %   Platelets 240 150 - 400 K/uL   Neutrophils Relative % 90 %   Neutro Abs 14.9 (H) 1.7 - 7.7 K/uL   Lymphocytes Relative 6 %   Lymphs Abs 1.0 0.7 - 4.0 K/uL   Monocytes Relative 4 %   Monocytes Absolute 0.7 0.1 - 1.0 K/uL   Eosinophils Relative 0 %   Eosinophils Absolute 0.0 0.0 - 0.7 K/uL   Basophils Relative 0 %   Basophils Absolute 0.0 0.0 - 0.1 K/uL    Comment: Performed at [facility], [place] [address] [place], [place] [address]  Lithium level     Status: Abnormal   Collection Time: 10/15/17  3:02 PM  Result Value Ref Range   Lithium Lvl 3.11 (HH) 0.60 - 1.20 mmol/L    Comment: CRITICAL RESULT CALLED TO, READ BACK BY AND VERIFIED WITH: C.HALL AT 1721 ON 10/15/17 BY N.THOMPSON Performed at [facility],  [place] [address] [place], [place] [address]   Ethanol     Status: None   Collection Time: 10/15/17  3:23 PM  Result Value Ref Range   Alcohol, Ethyl (B) <10 <10 mg/dL    Comment: (NOTE) Lowest detectable limit for serum alcohol is 10 mg/dL. For medical purposes only. Performed at [facility], [place] [address] [place], [place] [address]   TSH     Status: Abnormal   Collection Time: 10/15/17  3:23 PM  Result Value Ref Range   TSH 5.615 (H) 0.350 - 4.500 uIU/mL    Comment: Performed by a 3rd Generation assay with a functional sensitivity of <=0.01 uIU/mL. Performed at [facility], [place] [address] [place], [place] [address]   CBG monitoring, ED     Status: Abnormal   Collection Time: 10/15/17  3:25 PM  Result Value Ref Range   Glucose-Capillary 141 (H) 65 - 99 mg/dL  I-stat Chem 8, ED     Status: Abnormal   Collection Time: 10/15/17  3:33 PM  Result  Value Ref Range   Sodium 128 (L) 135 - 145 mmol/L   Potassium 3.2 (L) 3.5 - 5.1 mmol/L   Chloride 98 (L) 101 - 111 mmol/L   BUN 13 6 - 20 mg/dL   Creatinine, Ser 2.00 (H) 0.44 - 1.00 mg/dL   Glucose, Bld 172 (H) 65 - 99 mg/dL   Calcium, Ion 1.25 1.15 - 1.40 mmol/L   TCO2 22 22 - 32 mmol/L   Hemoglobin 15.0 12.0 - 15.0 g/dL   HCT 44.0 36.0 - 46.0 %  I-Stat CG4 Lactic Acid, ED     Status: Abnormal   Collection Time: 10/15/17  3:33 PM  Result Value Ref Range   Lactic Acid, Venous 4.66 (HH) 0.5 - 1.9 mmol/L   Comment NOTIFIED PHYSICIAN   CK     Status: None   Collection Time: 10/15/17  4:25 PM  Result Value Ref Range   Total CK 78 38 - 234 U/L    Comment: Performed at [facility], [place] [address], [place], [place] [address]  Comprehensive metabolic panel     Status: Abnormal   Collection Time: 10/15/17  4:25 PM  Result Value Ref Range   Sodium 130 (L) 135 - 145 mmol/L   Potassium 3.0 (L) 3.5 - 5.1 mmol/L   Chloride 102 101 - 111 mmol/L   CO2 20 (L) 22 - 32 mmol/L   Glucose, Bld 116 (H) 65 - 99 mg/dL   BUN 13 6 - 20 mg/dL   Creatinine, Ser 1.86 (H) 0.44 - 1.00 mg/dL   Calcium 9.3 8.9 - 10.3 mg/dL   Total Protein 6.3 (L) 6.5 - 8.1 g/dL   Albumin 3.7 3.5 - 5.0 g/dL   AST 33 15 - 41 U/L   ALT 34 14 - 54 U/L   Alkaline Phosphatase 86 38 - 126 U/L   Total Bilirubin 0.7 0.3 - 1.2 mg/dL   GFR calc non Af Amer 29 (L) >60 mL/min   GFR calc Af Amer 33 (L) >60 mL/min    Comment: (NOTE) The eGFR has been calculated using the CKD EPI equation. This calculation has not been validated in all clinical situations. eGFR's persistently <60 mL/min signify possible Chronic Kidney Disease.    Anion gap 8 5 - 15    Comment: Performed at [facility], [place] [address], [place],  [address]  Ammonia     Status: None  Collection Time: 10/15/17  4:25 PM  Result Value Ref Range   Ammonia 29 9 - 35 umol/L    Comment: Performed at [facility], [place] [address] [place], [place] [address]  Magnesium     Status: Abnormal   Collection Time: 10/15/17  4:25 PM  Result Value Ref Range   Magnesium 2.7 (H) 1.7 - 2.4 mg/dL    Comment: Performed at [facility], [place] [address] [place], [place] [address]  Urinalysis, Routine w reflex microscopic     Status: Abnormal   Collection Time: 10/15/17  7:20 PM  Result Value Ref Range   Color, Urine STRAW (A) YELLOW   APPearance CLEAR CLEAR   Specific Gravity, Urine 1.003 (L) 1.005 - 1.030   pH 7.0 5.0 - 8.0   Glucose, UA NEGATIVE NEGATIVE mg/dL   Hgb urine dipstick NEGATIVE NEGATIVE   Bilirubin Urine NEGATIVE NEGATIVE   Ketones, ur NEGATIVE NEGATIVE mg/dL   Protein, ur NEGATIVE NEGATIVE mg/dL   Nitrite NEGATIVE NEGATIVE   Leukocytes, UA NEGATIVE NEGATIVE    Comment: Performed at [facility] [address], [place], [place] [address]  Rapid urine drug screen (hospital performed)     Status: None   Collection Time: 10/15/17  7:20 PM  Result Value Ref Range   Opiates NONE DETECTED NONE DETECTED   Cocaine NONE DETECTED NONE DETECTED   Benzodiazepines NONE DETECTED NONE DETECTED   Amphetamines NONE DETECTED NONE DETECTED   Tetrahydrocannabinol NONE DETECTED NONE DETECTED   Barbiturates NONE DETECTED NONE DETECTED    Comment: (NOTE) DRUG SCREEN FOR MEDICAL PURPOSES ONLY.  IF CONFIRMATION IS NEEDED FOR ANY PURPOSE, NOTIFY LAB WITHIN 5 DAYS. LOWEST DETECTABLE LIMITS FOR URINE DRUG SCREEN Drug Class                     Cutoff (ng/mL) Amphetamine and metabolites    1000 Barbiturate and metabolites    200 Benzodiazepine                 174 Tricyclics and metabolites     300 Opiates and metabolites        300 Cocaine and metabolites        300 THC                            50 Performed at [facility], [place] [address] [place], [place] [address]   Comprehensive metabolic panel     Status: Abnormal   Collection Time:  10/15/17  7:41 PM  Result Value Ref Range   Sodium 134 (L) 135 - 145 mmol/L   Potassium 3.0 (L) 3.5 - 5.1 mmol/L   Chloride 105 101 - 111 mmol/L   CO2 20 (L) 22 - 32 mmol/L   Glucose, Bld 129 (H) 65 - 99 mg/dL   BUN 13 6 - 20 mg/dL   Creatinine, Ser 2.00 (H) 0.44 - 1.00 mg/dL   Calcium 8.6 (L) 8.9 - 10.3 mg/dL   Total Protein 5.6 (L) 6.5 - 8.1 g/dL   Albumin 3.4 (L) 3.5 - 5.0 g/dL   AST 32 15 - 41 U/L   ALT 28 14 - 54 U/L   Alkaline Phosphatase 79 38 - 126 U/L   Total Bilirubin 0.4 0.3 - 1.2 mg/dL   GFR calc non Af Amer 26 (L) >60 mL/min   GFR calc Af Amer 31 (L) >60 mL/min  Comment: (NOTE) The eGFR has been calculated using the CKD EPI equation. This calculation has not been validated in all clinical situations. eGFR's persistently <60 mL/min signify possible Chronic Kidney Disease.    Anion gap 9 5 - 15    Comment: Performed at [facility], [place] [address], [place], [place] [address]  Lithium level     Status: Abnormal   Collection Time: 10/15/17  7:41 PM  Result Value Ref Range   Lithium Lvl 2.56 (HH) 0.60 - 1.20 mmol/L    Comment: CRITICAL RESULT CALLED TO, READ BACK BY AND VERIFIED WITH: J.NASH AT 2116 ON 10/15/17 BY N.THOMPSON Performed at [facility], [place] [address], [place],  [address]   I-Stat CG4 Lactic Acid, ED     Status: Abnormal   Collection Time: 10/15/17  8:09 PM  Result Value Ref Range   Lactic Acid, Venous 2.83 (HH) 0.5 - 1.9 mmol/L   Comment NOTIFIED PHYSICIAN   I-Stat CG4 Lactic Acid, ED     Status: None   Collection Time: 10/15/17 10:21 PM  Result Value Ref Range   Lactic Acid, Venous 0.97 0.5 - 1.9 mmol/L   Dg Chest 2 View  Result Date: 10/15/2017 CLINICAL DATA:  Heat prostration with altered mental status EXAM: CHEST - 2 VIEW COMPARISON:  None. FINDINGS: The lungs are clear. The heart size and pulmonary vascularity are normal. No adenopathy. No bone lesions. IMPRESSION: No edema or consolidation.  Electronically Signed   By: Lowella Grip III M.D.   On: 10/15/2017 21:01   Ct Head Wo Contrast  Result Date: 10/15/2017 CLINICAL DATA:  Questionable fall.  Altered mental status EXAM: CT HEAD WITHOUT CONTRAST TECHNIQUE: Contiguous axial images were obtained from the base of the skull through the vertex without intravenous contrast. COMPARISON:  None. FINDINGS: Brain: There is mild diffuse atrophy. There is no intracranial mass, hemorrhage, extra-axial fluid collection, or midline shift. There is mild small vessel disease in the centra semiovale bilaterally. Elsewhere gray-white compartments appear normal. No evident acute infarct. Vascular: No appreciable hyperdense vessel. There is calcification in both carotid siphon regions. Skull: Bony calvarium appears intact. Sinuses/Orbits: Visualized paranasal sinuses are clear. Visualized orbits appear symmetric bilaterally. Other: Mastoid air cells are clear. IMPRESSION: Atrophy with slight periventricular small vessel disease. No mass or hemorrhage. No evident acute infarct. There are foci of vascular calcification. Electronically Signed   By: Lowella Grip III M.D.   On: 10/15/2017 15:58    Pending Labs Unresulted Labs (From admission, onward)   Start     Ordered   10/16/17 0500  HIV antibody (Routine Testing)  Tomorrow morning,   R     10/15/17 2302   10/16/17 0500  T4, free  Tomorrow morning,   R     10/15/17 2307   10/16/17 5366  Basic metabolic panel  Now then every 6 hours,   R     10/15/17 2302   10/16/17 0000  Lithium level  Now then every 6 hours,   R     10/15/17 2302   10/16/17 0000  Lactic acid, plasma  Now then every 3 hours,   R     10/15/17 2306   10/15/17 2307  Ethanol  Add-on,   R     10/15/17 2306   10/15/17 2306  Acetaminophen level  Add-on,   R     10/15/17 2305   44/03/47 4259  Salicylate level  Add-on,   R     10/15/17  2305   10/15/17 1538  Blood Culture (routine x 2)  BLOOD CULTURE X 2,   STAT     10/15/17 1539       Vitals/Pain Today's Vitals   10/15/17 2215 10/15/17 2230 10/15/17 2300 10/15/17 2339  BP: 124/61 124/61 (!) 92/46 118/65  Pulse: 75 75 67 83  Resp:  (!) 26 (!) 21 18  Temp:      TempSrc:      SpO2:  96% 99% 100%  Weight:        Isolation Precautions No active isolations  Medications Medications  LORazepam (ATIVAN) injection 0-4 mg (0 mg Intravenous Not Given 10/15/17 2240)    Or  LORazepam (ATIVAN) tablet 0-4 mg ( Oral See Alternative 10/15/17 2240)  LORazepam (ATIVAN) injection 0-4 mg (has no administration in time range)    Or  LORazepam (ATIVAN) tablet 0-4 mg (has no administration in time range)  thiamine (VITAMIN B-1) tablet 100 mg ( Oral See Alternative 10/15/17 1621)    Or  thiamine (B-1) injection 100 mg (100 mg Intravenous Given 10/15/17 1621)  heparin injection 5,000 Units (has no administration in time range)  0.9 %  sodium chloride infusion (has no administration in time range)  potassium chloride 10 mEq in 100 mL IVPB (has no administration in time range)  ondansetron (ZOFRAN) tablet 4 mg (has no administration in time range)    Or  ondansetron (ZOFRAN) injection 4 mg (has no administration in time range)  acetaminophen (TYLENOL) tablet 650 mg (has no administration in time range)    Or  acetaminophen (TYLENOL) suppository 650 mg (has no administration in time range)  albuterol (PROVENTIL) (2.5 MG/3ML) 0.083% nebulizer solution 2.5 mg (has no administration in time range)  LORazepam (ATIVAN) tablet 1 mg (1 mg Oral Given 10/15/17 1542)  sodium chloride 0.9 % bolus 2,000 mL (0 mLs Intravenous Stopped 10/15/17 1900)  magnesium sulfate IVPB 2 g 50 mL (0 g Intravenous Stopped 10/15/17 2108)  potassium chloride SA (K-DUR,KLOR-CON) CR tablet 40 mEq (40 mEq Oral Given 10/15/17 1949)    Mobility walks with person assist

## 2017-10-16 NOTE — Consult Note (Signed)
[facility] Face-to-Face Psychiatry Consult   Reason for Consult:  ''medication overdose.'' Referring Physician:  Dr. Alfredia Ferguson Patient Identification: Megan Miranda MRN:  [id number] Principal Diagnosis: Lithium toxicity Diagnosis:   Patient Active Problem List   Diagnosis Date Noted  . SIRS (systemic inflammatory response syndrome) (HCC) [R65.10] 10/16/2017  . Lactic acidosis [E87.2] 10/16/2017  . Abnormal TSH [R79.89] 10/16/2017  . History of alcohol abuse [Z87.898] 10/16/2017  . Lithium toxicity [T56.891A] 10/15/2017  . Bipolar I disorder ([place]) [F31.9] 07/21/2007    Total Time spent with patient: 1 hour  Subjective:   Megan Miranda is a 57 y.o. female patient admitted with lithium toxicity.  HPI: Patient with history of Schizoaffective disorder-Bipolar type and alcohol abuse who was brought to the hospital by EMS after she was found with altered mental status by her ex-husband. Patient reports that she she lives at [facility] assisted living environment. Patient reports that she had a fall after  she decided to take all her medications she did take for 3 weeks all at once yesterday. She was prescribed Lamictal 200 mg qhs, Lithium 300 mg qhs and Prolixin 77m twice daily. She [facility] Today, patient is alert, oriented x4 and denies delusions, psychosis , SI/HI. She denies intentionally attempting to kill herself and says she overdosed by accident. She denies prior suicide attempt and says her family is making arrangement for '' a nurse to give my medications when I get back home''. Patient denies drugs and alcohol abuse.   Past Psychiatric History: as above  Risk to Self: Is patient at risk for suicide?: No Risk to Others:   Prior Inpatient Therapy:   Prior Outpatient Therapy:    Past Medical History:  Past Medical History:  Diagnosis Date  . Bipolar disorder (HThermal    History reviewed. No pertinent  surgical history. Family History: History reviewed. No pertinent family history. Family Psychiatric  History: as above Social History:  Social History   Substance and Sexual Activity  Alcohol Use No     Social History   Substance and Sexual Activity  Drug Use No    Social History   Socioeconomic History  . Marital status: Married    Spouse name: Not on file  . Number of children: Not on file  . Years of education: Not on file  . Highest education level: Not on file  Occupational History  . Not on file  Social Needs  . Financial resource strain: Not on file  . Food insecurity:    Worry: Not on file    Inability: Not on file  . Transportation needs:    Medical: Not on file    Non-medical: Not on file  Tobacco Use  . Smoking status: Current Some Day Smoker    Packs/day: 0.10    Years: 2.00    Pack years: 0.20  . Smokeless tobacco: Never Used  . Tobacco comment: Smokes a few cigarettes a week.   Substance and Sexual Activity  . Alcohol use: No  . Drug use: No  . Sexual activity: Never  Lifestyle  . Physical activity:    Days per week: Not on file    Minutes per session: Not on file  . Stress: Not on file  Relationships  . Social connections:    Talks on phone: Not on file    Gets together: Not on file    Attends religious service: Not on file  Active member of club or organization: Not on file    Attends meetings of clubs or organizations: Not on file    Relationship status: Not on file  Other Topics Concern  . Not on file  Social History Narrative  . Not on file   Additional Social History:    Allergies:   Allergies  Allergen Reactions  . Soap Rash    Labs:  Results for orders placed or performed during the hospital encounter of 10/15/17 (from the past 48 hour(s))  CBC with Differential     Status: Abnormal   Collection Time: 10/15/17  3:02 PM  Result Value Ref Range   WBC 16.6 (H) 4.0 - 10.5 K/uL   RBC 4.53 3.87 - 5.11 MIL/uL   Hemoglobin  15.1 (H) 12.0 - 15.0 g/dL   HCT 42.0 36.0 - 46.0 %   MCV 92.7 78.0 - 100.0 fL   MCH 33.3 26.0 - 34.0 pg   MCHC 36.0 30.0 - 36.0 g/dL   RDW 13.1 11.5 - 15.5 %   Platelets 240 150 - 400 K/uL   Neutrophils Relative % 90 %   Neutro Abs 14.9 (H) 1.7 - 7.7 K/uL   Lymphocytes Relative 6 %   Lymphs Abs 1.0 0.7 - 4.0 K/uL   Monocytes Relative 4 %   Monocytes Absolute 0.7 0.1 - 1.0 K/uL   Eosinophils Relative 0 %   Eosinophils Absolute 0.0 0.0 - 0.7 K/uL   Basophils Relative 0 %   Basophils Absolute 0.0 0.0 - 0.1 K/uL    Comment: Performed at [facility], [place] [address], [place], [place] [address]  Lithium level     Status: Abnormal   Collection Time: 10/15/17  3:02 PM  Result Value Ref Range   Lithium Lvl 3.11 (HH) 0.60 - 1.20 mmol/L    Comment: CRITICAL RESULT CALLED TO, READ BACK BY AND VERIFIED WITH: C.HALL AT 1721 ON 10/15/17 BY N.THOMPSON Performed at [facility], [place] [address], [place], [place] [address]   Ethanol     Status: None   Collection Time: 10/15/17  3:23 PM  Result Value Ref Range   Alcohol, Ethyl (B) <10 <10 mg/dL    Comment: (NOTE) Lowest detectable limit for serum alcohol is 10 mg/dL. For medical purposes only. Performed at [facility], [place] [address] [place], [place] [address]   TSH     Status: Abnormal   Collection Time: 10/15/17  3:23 PM  Result Value Ref Range   TSH 5.615 (H) 0.350 - 4.500 uIU/mL    Comment: Performed by a 3rd Generation assay with a functional sensitivity of <=0.01 uIU/mL. Performed at [facility], [place] [address] [place], [place] [address]   CBG monitoring, ED     Status: Abnormal   Collection Time: 10/15/17  3:25 PM  Result Value Ref Range   Glucose-Capillary 141 (H) 65 - 99 mg/dL  I-stat Chem 8, ED     Status: Abnormal   Collection Time: 10/15/17  3:33 PM  Result Value Ref Range   Sodium 128 (L) 135 - 145 mmol/L   Potassium 3.2 (L) 3.5 - 5.1 mmol/L    Chloride 98 (L) 101 - 111 mmol/L   BUN 13 6 - 20 mg/dL   Creatinine, Ser 2.00 (H) 0.44 - 1.00 mg/dL   Glucose, Bld 172 (H) 65 - 99 mg/dL   Calcium, Ion 1.25 1.15 - 1.40 mmol/L   TCO2 22 22 - 32 mmol/L  Hemoglobin 15.0 12.0 - 15.0 g/dL   HCT 44.0 36.0 - 46.0 %  I-Stat CG4 Lactic Acid, ED     Status: Abnormal   Collection Time: 10/15/17  3:33 PM  Result Value Ref Range   Lactic Acid, Venous 4.66 (HH) 0.5 - 1.9 mmol/L   Comment NOTIFIED PHYSICIAN   Blood Culture (routine x 2)     Status: None (Preliminary result)   Collection Time: 10/15/17  4:24 PM  Result Value Ref Range   Specimen Description      BLOOD RIGHT ANTECUBITAL Performed at [facility], [place] [address], [place], [place] [address]    Special Requests      BOTTLES DRAWN AEROBIC AND ANAEROBIC Blood Culture results may not be optimal due to an excessive volume of blood received in culture bottles Performed at [place] [address], [place], [place] [address]    Culture      NO GROWTH < 24 HOURS Performed at [place] [address], [place], [place] [address]    Report Status PENDING   CK     Status: None   Collection Time: 10/15/17  4:25 PM  Result Value Ref Range   Total CK 78 38 - 234 U/L    Comment: Performed at [facility], [place] [address], [place], [place] [address]  Comprehensive metabolic panel     Status: Abnormal   Collection Time: 10/15/17  4:25 PM  Result Value Ref Range   Sodium 130 (L) 135 - 145 mmol/L   Potassium 3.0 (L) 3.5 - 5.1 mmol/L   Chloride 102 101 - 111 mmol/L   CO2 20 (L) 22 - 32 mmol/L   Glucose, Bld 116 (H) 65 - 99 mg/dL   BUN 13 6 - 20 mg/dL   Creatinine, Ser 1.86 (H) 0.44 - 1.00 mg/dL   Calcium 9.3 8.9 - 10.3 mg/dL   Total Protein 6.3 (L) 6.5 - 8.1 g/dL   Albumin 3.7 3.5 - 5.0 g/dL   AST 33 15 - 41 U/L   ALT 34 14 - 54 U/L   Alkaline Phosphatase 86 38 - 126 U/L   Total Bilirubin 0.7 0.3 - 1.2 mg/dL   GFR  calc non Af Amer 29 (L) >60 mL/min   GFR calc Af Amer 33 (L) >60 mL/min    Comment: (NOTE) The eGFR has been calculated using the CKD EPI equation. This calculation has not been validated in all clinical situations. eGFR's persistently <60 mL/min signify possible Chronic Kidney Disease.    Anion gap 8 5 - 15    Comment: Performed at [facility], [place] [address], [place], [place] [address]  Ammonia     Status: None   Collection Time: 10/15/17  4:25 PM  Result Value Ref Range   Ammonia 29 9 - 35 umol/L    Comment: Performed at [facility], [place] [address] [place], [place] [address]  Magnesium     Status: Abnormal   Collection Time: 10/15/17  4:25 PM  Result Value Ref Range   Magnesium 2.7 (H) 1.7 - 2.4 mg/dL    Comment: Performed at [facility], [place] [address], [place], [place] [address]  Urinalysis, Routine w reflex microscopic     Status: Abnormal   Collection Time: 10/15/17  7:20 PM  Result Value Ref Range   Color, Urine STRAW (A) YELLOW   APPearance CLEAR CLEAR   Specific Gravity, Urine 1.003 (L)  1.005 - 1.030   pH 7.0 5.0 - 8.0   Glucose, UA NEGATIVE NEGATIVE mg/dL   Hgb urine dipstick NEGATIVE NEGATIVE   Bilirubin Urine NEGATIVE NEGATIVE   Ketones, ur NEGATIVE NEGATIVE mg/dL   Protein, ur NEGATIVE NEGATIVE mg/dL   Nitrite NEGATIVE NEGATIVE   Leukocytes, UA NEGATIVE NEGATIVE    Comment: Performed at [place] [address] [place], [place] [address]  Rapid urine drug screen (hospital performed)     Status: None   Collection Time: 10/15/17  7:20 PM  Result Value Ref Range   Opiates NONE DETECTED NONE DETECTED   Cocaine NONE DETECTED NONE DETECTED   Benzodiazepines NONE DETECTED NONE DETECTED   Amphetamines NONE DETECTED NONE DETECTED   Tetrahydrocannabinol NONE DETECTED NONE DETECTED   Barbiturates NONE DETECTED NONE DETECTED    Comment: (NOTE) DRUG SCREEN FOR MEDICAL PURPOSES ONLY.  IF  CONFIRMATION IS NEEDED FOR ANY PURPOSE, NOTIFY LAB WITHIN 5 DAYS. LOWEST DETECTABLE LIMITS FOR URINE DRUG SCREEN Drug Class                     Cutoff (ng/mL) Amphetamine and metabolites    1000 Barbiturate and metabolites    200 Benzodiazepine                 626 Tricyclics and metabolites     300 Opiates and metabolites        300 Cocaine and metabolites        300 THC                            50 Performed at [facility], [place] [address], [place], [place] [address]   Comprehensive metabolic panel     Status: Abnormal   Collection Time: 10/15/17  7:41 PM  Result Value Ref Range   Sodium 134 (L) 135 - 145 mmol/L   Potassium 3.0 (L) 3.5 - 5.1 mmol/L   Chloride 105 101 - 111 mmol/L   CO2 20 (L) 22 - 32 mmol/L   Glucose, Bld 129 (H) 65 - 99 mg/dL   BUN 13 6 - 20 mg/dL   Creatinine, Ser 2.00 (H) 0.44 - 1.00 mg/dL   Calcium 8.6 (L) 8.9 - 10.3 mg/dL   Total Protein 5.6 (L) 6.5 - 8.1 g/dL   Albumin 3.4 (L) 3.5 - 5.0 g/dL   AST 32 15 - 41 U/L   ALT 28 14 - 54 U/L   Alkaline Phosphatase 79 38 - 126 U/L   Total Bilirubin 0.4 0.3 - 1.2 mg/dL   GFR calc non Af Amer 26 (L) >60 mL/min   GFR calc Af Amer 31 (L) >60 mL/min    Comment: (NOTE) The eGFR has been calculated using the CKD EPI equation. This calculation has not been validated in all clinical situations. eGFR's persistently <60 mL/min signify possible Chronic Kidney Disease.    Anion gap 9 5 - 15    Comment: Performed at [facility], [place] [address], [place], [place] [address]  Lithium level     Status: Abnormal   Collection Time: 10/15/17  7:41 PM  Result Value Ref Range   Lithium Lvl 2.56 (HH) 0.60 - 1.20 mmol/L    Comment: CRITICAL RESULT CALLED TO, READ BACK BY AND VERIFIED WITH: J.NASH AT 2116 ON 10/15/17 BY N.THOMPSON Performed at [facility], [place] [address] [place],  [address]   I-Stat [place]  Lactic Acid, ED     Status: Abnormal   Collection Time:  10/15/17  8:09 PM  Result Value Ref Range   Lactic Acid, Venous 2.83 (HH) 0.5 - 1.9 mmol/L   Comment NOTIFIED PHYSICIAN   I-Stat CG4 Lactic Acid, ED     Status: None   Collection Time: 10/15/17 10:21 PM  Result Value Ref Range   Lactic Acid, Venous 0.97 0.5 - 1.9 mmol/L  Acetaminophen level     Status: Abnormal   Collection Time: 10/15/17 11:02 PM  Result Value Ref Range   Acetaminophen (Tylenol), Serum <10 (L) 10 - 30 ug/mL    Comment: (NOTE) Therapeutic concentrations vary significantly. A range of 10-30 ug/mL  may be an effective concentration for many patients. However, some  are best treated at concentrations outside of this range. Acetaminophen concentrations >150 ug/mL at 4 hours after ingestion  and >50 ug/mL at 12 hours after ingestion are often associated with  toxic reactions. Performed at [facility], [place] [address], [place], [place] [address]   Salicylate level     Status: None   Collection Time: 10/15/17 11:02 PM  Result Value Ref Range   Salicylate Lvl <9.6 2.8 - 30.0 mg/dL    Comment: Performed at [facility], [place] [address], [place], [place] [address]  Ethanol     Status: None   Collection Time: 10/15/17 11:30 PM  Result Value Ref Range   Alcohol, Ethyl (B) <10 <10 mg/dL    Comment: (NOTE) Lowest detectable limit for serum alcohol is 10 mg/dL. For medical purposes only. Performed at [facility], [place] [address], [place], [place] [address]   Lithium level     Status: Abnormal   Collection Time: 10/16/17 12:00 AM  Result Value Ref Range   Lithium Lvl 2.38 (HH) 0.60 - 1.20 mmol/L    Comment: CRITICAL RESULT CALLED TO, READ BACK BY AND VERIFIED WITH: DAVIS,R AT 0055 ON 10/16/2017 BY MOSLEY,J Performed at [facility], [place] [address] [place], [place] [address]   MRSA PCR Screening     Status: None   Collection Time: 10/16/17 12:43 AM  Result Value Ref Range   MRSA by PCR NEGATIVE  NEGATIVE    Comment:        The GeneXpert MRSA Assay (FDA approved for NASAL specimens only), is one component of a comprehensive MRSA colonization surveillance program. It is not intended to diagnose MRSA infection nor to guide or monitor treatment for MRSA infections. Performed at [facility], [place] [address] [place], [place] [address]   Basic metabolic panel     Status: Abnormal   Collection Time: 10/16/17  3:33 AM  Result Value Ref Range   Sodium 136 135 - 145 mmol/L   Potassium 4.5 3.5 - 5.1 mmol/L    Comment: DELTA CHECK NOTED SLIGHT HEMOLYSIS    Chloride 111 101 - 111 mmol/L   CO2 19 (L) 22 - 32 mmol/L   Glucose, Bld 102 (H) 65 - 99 mg/dL   BUN 15 6 - 20 mg/dL   Creatinine, Ser 1.86 (H) 0.44 - 1.00 mg/dL   Calcium 8.8 (L) 8.9 - 10.3 mg/dL   GFR calc non Af Amer 29 (L) >60 mL/min   GFR calc Af Amer 33 (L) >60 mL/min    Comment: (NOTE) The eGFR has been calculated using the CKD EPI equation. This calculation has not been validated in all clinical situations. eGFR's persistently <60 mL/min signify possible  Chronic Kidney Disease.    Anion gap 6 5 - 15    Comment: Performed at [facility], [place] [address], [place], [place] [address]  Lactic acid, plasma     Status: None   Collection Time: 10/16/17  3:33 AM  Result Value Ref Range   Lactic Acid, Venous 1.0 0.5 - 1.9 mmol/L    Comment: Performed at [facility], [place] [address] [place], [place] [address]  Lithium level     Status: Abnormal   Collection Time: 10/16/17  5:48 AM  Result Value Ref Range   Lithium Lvl 2.19 (HH) 0.60 - 1.20 mmol/L    Comment: CRITICAL RESULT CALLED TO, READ BACK BY AND VERIFIED WITH: R.DAVIS RN 10/16/2017 4010 JR Performed at [facility], [place] [address], [place], [place] [address]   Lactic acid, plasma     Status: None   Collection Time: 10/16/17  5:48 AM  Result Value Ref Range   Lactic Acid, Venous 1.2 0.5  - 1.9 mmol/L    Comment: Performed at [facility], [place] [address], [place], [place] [address]  T4, free     Status: None   Collection Time: 10/16/17  5:48 AM  Result Value Ref Range   Free T4 0.83 0.82 - 1.77 ng/dL    Comment: (NOTE) Biotin ingestion may interfere with free T4 tests. If the results are inconsistent with the TSH level, previous test results, or the clinical presentation, then consider biotin interference. If needed, order repeat testing after stopping biotin. Performed at [facility], [place] [address], [place], [place] [address]   CBC with Differential/Platelet     Status: Abnormal   Collection Time: 10/16/17  5:48 AM  Result Value Ref Range   WBC 8.8 4.0 - 10.5 K/uL   RBC 3.56 (L) 3.87 - 5.11 MIL/uL   Hemoglobin 11.6 (L) 12.0 - 15.0 g/dL    Comment: REPEATED TO VERIFY DELTA CHECK NOTED    HCT 34.1 (L) 36.0 - 46.0 %   MCV 95.8 78.0 - 100.0 fL   MCH 32.6 26.0 - 34.0 pg   MCHC 34.0 30.0 - 36.0 g/dL   RDW 13.5 11.5 - 15.5 %   Platelets 184 150 - 400 K/uL   Neutrophils Relative % 71 %   Neutro Abs 6.2 1.7 - 7.7 K/uL   Lymphocytes Relative 20 %   Lymphs Abs 1.8 0.7 - 4.0 K/uL   Monocytes Relative 8 %   Monocytes Absolute 0.7 0.1 - 1.0 K/uL   Eosinophils Relative 1 %   Eosinophils Absolute 0.1 0.0 - 0.7 K/uL   Basophils Relative 0 %   Basophils Absolute 0.0 0.0 - 0.1 K/uL    Comment: Performed at [facility], [place] [address], [place],  [address]  Basic metabolic panel     Status: Abnormal   Collection Time: 10/16/17  8:17 AM  Result Value Ref Range   Sodium 140 135 - 145 mmol/L   Potassium 4.5 3.5 - 5.1 mmol/L   Chloride 116 (H) 101 - 111 mmol/L   CO2 19 (L) 22 - 32 mmol/L   Glucose, Bld 92 65 - 99 mg/dL   BUN 14 6 - 20 mg/dL   Creatinine, Ser 1.85 (H) 0.44 - 1.00 mg/dL   Calcium 8.9 8.9 - 10.3 mg/dL   GFR calc non Af Amer 29 (L) >60 mL/min   GFR calc Af Amer 34 (L) >60 mL/min    Comment: (  NOTE) The  eGFR has been calculated using the CKD EPI equation. This calculation has not been validated in all clinical situations. eGFR's persistently <60 mL/min signify possible Chronic Kidney Disease.    Anion gap 5 5 - 15    Comment: Performed at [facility], [place] [address], [place], [place] [address]  Magnesium     Status: Abnormal   Collection Time: 10/16/17  8:17 AM  Result Value Ref Range   Magnesium 3.0 (H) 1.7 - 2.4 mg/dL    Comment: Performed at [facility], [place] [address], [place], [place] [address]  Phosphorus     Status: Abnormal   Collection Time: 10/16/17  8:17 AM  Result Value Ref Range   Phosphorus 1.4 (L) 2.5 - 4.6 mg/dL    Comment: Performed at [facility], [place] [address] [place], [place] [address]  Hepatic function panel     Status: Abnormal   Collection Time: 10/16/17  8:17 AM  Result Value Ref Range   Total Protein 5.6 (L) 6.5 - 8.1 g/dL   Albumin 3.3 (L) 3.5 - 5.0 g/dL   AST 22 15 - 41 U/L   ALT 27 14 - 54 U/L   Alkaline Phosphatase 77 38 - 126 U/L   Total Bilirubin 0.6 0.3 - 1.2 mg/dL   Bilirubin, Direct 0.1 0.1 - 0.5 mg/dL   Indirect Bilirubin 0.5 0.3 - 0.9 mg/dL    Comment: Performed at [facility], [place] [address], [place], [place] [address]    Current Facility-Administered Medications  Medication Dose Route Frequency Provider Last Rate Last Dose  . 0.9 %  sodium chloride infusion   Intravenous Continuous Fuller Plan A, MD 150 mL/hr at 10/16/17 0112    . acetaminophen (TYLENOL) tablet 650 mg  650 mg Oral Q6H PRN Norval Morton, MD       Or  . acetaminophen (TYLENOL) suppository 650 mg  650 mg Rectal Q6H PRN Smith, Rondell A, MD      . albuterol (PROVENTIL) (2.5 MG/3ML) 0.083% nebulizer solution 2.5 mg  2.5 mg Nebulization Q6H PRN Tamala Julian, Rondell A, MD      . heparin injection 5,000 Units  5,000 Units Subcutaneous Q8H Fuller Plan A, MD   5,000 Units at 10/16/17 0519  .  LORazepam (ATIVAN) injection 0-4 mg  0-4 mg Intravenous Q6H Norval Morton, MD       Or  . LORazepam (ATIVAN) tablet 0-4 mg  0-4 mg Oral Q6H Smith, Rondell A, MD   1 mg at 10/15/17 1621  . [START ON 10/18/2017] LORazepam (ATIVAN) injection 0-4 mg  0-4 mg Intravenous Q12H Smith, Rondell A, MD       Or  . Derrill Memo ON 10/18/2017] LORazepam (ATIVAN) tablet 0-4 mg  0-4 mg Oral Q12H Smith, Rondell A, MD      . ondansetron (ZOFRAN) tablet 4 mg  4 mg Oral Q6H PRN Fuller Plan A, MD       Or  . ondansetron (ZOFRAN) injection 4 mg  4 mg Intravenous Q6H PRN Smith, Rondell A, MD      . thiamine (VITAMIN B-1) tablet 100 mg  100 mg Oral Daily Tamala Julian, Rondell A, MD   100 mg at 10/16/17 5035   Or  . thiamine (B-1) injection 100 mg  100 mg Intravenous Daily Fuller Plan A, MD   100 mg at 10/15/17 1621    Musculoskeletal: Strength & Muscle Tone: within normal limits Gait & Station: normal Patient  leans: N/A  Psychiatric Specialty Exam: Physical Exam  Psychiatric: She has a normal mood and affect. Her speech is normal and behavior is normal. Judgment and thought content normal. Cognition and memory are normal.    Review of Systems  Constitutional: Negative.   HENT: Negative.   Eyes: Negative.   Respiratory: Negative.   Cardiovascular: Negative.   Gastrointestinal: Negative.   Genitourinary: Negative.   Skin: Negative.   Neurological: Negative.   Endo/Heme/Allergies: Negative.   Psychiatric/Behavioral: Negative.     Blood pressure (!) 113/97, pulse 81, temperature 98.4 F (36.9 C), temperature source Axillary, resp. rate (!) 27, height '5\' 5"'  (1.651 m), weight 72.6 kg (160 lb 0.9 oz), SpO2 100 %.Body mass index is 26.63 kg/m.  General Appearance: Casual  Eye Contact:  Good  Speech:  Clear and Coherent  Volume:  Normal  Mood:  Euphoric  Affect:  Appropriate  Thought Process:  Coherent and Descriptions of Associations: Intact  Orientation:  Full (Time, Place, and Person)  Thought Content:   Logical  Suicidal Thoughts:  No  Homicidal Thoughts:  No  Memory:  Immediate;   Good Recent;   Good Remote;   Good  Judgement:  Fair  Insight:  Shallow  Psychomotor Activity:  Normal  Concentration:  Concentration: Fair and Attention Span: Fair  Recall:  [employer] of Knowledge:  Fair  Language:  Good  Akathisia:  No  Handed:  Right  AIMS (if indicated):     Assets:  Communication Skills Housing Social Support  ADL's:  Intact  Cognition:  WNL  Sleep:        Treatment Plan Summary: -Chart reviewed -Patient is currently stable mentally. -Monitor Lithium Level -Do not restart  Lithium, will defer to patient's outpatient psychiatrist. - May Restart Lamictal 50 mg qhs and Prolixin 49m twice daily -Refer patient to Dr. PCasimiro Needlefor outpatient psychiatric care upon discharge. -Patient is cleared by psychiatric service   Disposition: No evidence of imminent risk to self or others at present.   Patient does not meet criteria for psychiatric inpatient admission. Supportive therapy provided about ongoing stressors. Re-consult psych service as needed  ACorena Pilgrim MD 10/16/2017 1:16 PM

## 2017-10-17 LAB — COMPREHENSIVE METABOLIC PANEL
ALBUMIN: 3.2 g/dL — AB (ref 3.5–5.0)
ALT: 28 U/L (ref 14–54)
ANION GAP: 6 (ref 5–15)
AST: 26 U/L (ref 15–41)
Alkaline Phosphatase: 78 U/L (ref 38–126)
BUN: 11 mg/dL (ref 6–20)
CHLORIDE: 121 mmol/L — AB (ref 101–111)
CO2: 18 mmol/L — AB (ref 22–32)
CREATININE: 1.61 mg/dL — AB (ref 0.44–1.00)
Calcium: 9.2 mg/dL (ref 8.9–10.3)
GFR calc non Af Amer: 34 mL/min — ABNORMAL LOW (ref >60)
GFR, EST AFRICAN AMERICAN: 40 mL/min — AB (ref >60)
GLUCOSE: 175 mg/dL — AB (ref 65–99)
Potassium: 3.7 mmol/L (ref 3.5–5.1)
SODIUM: 145 mmol/L (ref 135–145)
Total Bilirubin: 0.3 mg/dL (ref 0.3–1.2)
Total Protein: 5.5 g/dL — ABNORMAL LOW (ref 6.5–8.1)

## 2017-10-17 LAB — CBC WITH DIFFERENTIAL/PLATELET
BASOS ABS: 0 10*3/uL (ref 0.0–0.1)
BASOS PCT: 0 %
EOS ABS: 0.1 10*3/uL (ref 0.0–0.7)
Eosinophils Relative: 2 %
HCT: 34.8 % — ABNORMAL LOW (ref 36.0–46.0)
Hemoglobin: 11 g/dL — ABNORMAL LOW (ref 12.0–15.0)
LYMPHS PCT: 22 %
Lymphs Abs: 1.4 10*3/uL (ref 0.7–4.0)
MCH: 32.4 pg (ref 26.0–34.0)
MCHC: 31.6 g/dL (ref 30.0–36.0)
MCV: 102.4 fL — ABNORMAL HIGH (ref 78.0–100.0)
MONO ABS: 0.4 10*3/uL (ref 0.1–1.0)
Monocytes Relative: 6 %
NEUTROS PCT: 70 %
Neutro Abs: 4.3 10*3/uL (ref 1.7–7.7)
PLATELETS: 109 10*3/uL — AB (ref 150–400)
RBC: 3.4 MIL/uL — ABNORMAL LOW (ref 3.87–5.11)
RDW: 14.3 % (ref 11.5–15.5)
WBC: 6.2 10*3/uL (ref 4.0–10.5)

## 2017-10-17 LAB — LITHIUM LEVEL: LITHIUM LVL: 1.13 mmol/L (ref 0.60–1.20)

## 2017-10-17 LAB — PHOSPHORUS: PHOSPHORUS: 2.5 mg/dL (ref 2.5–4.6)

## 2017-10-17 LAB — MAGNESIUM: Magnesium: 2.2 mg/dL (ref 1.7–2.4)

## 2017-10-17 MED ORDER — LAMOTRIGINE 25 MG PO TABS: 50.0000 mg | ORAL_TABLET | Freq: Every day | ORAL | Status: DC

## 2017-10-17 MED ORDER — FLUPHENAZINE HCL 1 MG PO TABS
1.0000 mg | ORAL_TABLET | Freq: Two times a day (BID) | ORAL | Status: DC
  Administered 2017-10-17: 1 mg via ORAL
  Filled 2017-10-17: qty 1

## 2017-10-17 MED ORDER — SODIUM BICARBONATE 8.4 % IV SOLN
INTRAVENOUS | Status: DC
  Administered 2017-10-17: 08:00:00 via INTRAVENOUS
  Filled 2017-10-17: qty 150

## 2017-10-17 NOTE — Progress Notes (Signed)
Pt adamant that she is leaving the hospital with her personal RN and her brother.  MD has advised that he wanted to evaluate the results of labwork prior to d/c.  Pt adamant that she would not keep monitor on, IV attached or stay in the bed.  Pt adamant about putting on clothes and leaving.  Advised that she is not d/c'd yet, she states that her brother is on the way, although has no way of verifying that he is en route as she does not know his contact information.  Paged MD and will update.

## 2017-10-17 NOTE — Discharge Summary (Signed)
Physician Discharge Summary  LAKEIDRA RELIFORD YPP:[id number] DOB: [birth year] DOA: 10/15/2017  PCP: Patient, No Pcp Per  Admit date: 10/15/2017 Discharge date: 10/17/2017  Admitted From: Home Disposition: Signed out Against Medical Advice  Recommendations for Outpatient Follow-up:  1. Follow up with PCP in 1-2 weeks 2. Follow-up with Psychiatry in the outpatient setting 3. Please obtain CMP/CBC, Mag Phos in one week 4. Take Lamictal 50 mg p.o. nightly and increase per outpatient psychiatric recommendations and defer to outpatient psychiatrist for reinitiation of Lithium 5. Please follow up on the following pending results:  Home Health: No Equipment/Devices: None  Discharge Condition: [place] CODE STATUS: FULL CODE Diet recommendation:   Brief/Interim Summary: Aliayah Tyer Leonardis a 57 y.o.femalewith medical history significant ofextensive psychiatric history including bipolar 1 disorder, schizoaffective disorder, and history of alcohol abuse and other comorbids who presented from home via EMS after being found altered by her ex-husband after having a fall. Patient is a poor historian and history was obtained from review of records. Patient apparently lives at [facility] he was called by family members after patient had a witnessed fall outside. It was reported that she had not been taking any of her medications for the past week, but a friend reportedly gave her all of her medications at once today including 1,200 mg Lamictal 3,300 mg of lithium,and 1 Prolixin tablet. Patient states that she may have been taking moreofcertain medications and not enough of others. She admits to falling and sustaining a bruise to her right knee. Patient reported to having a "heatstroke"from being outside 30 minutes today. She reports being followed by psychiatrist Dr. Virginia Rochester outpatient setting.  Upon admission to the emergency department patient labs revealed WBC 16, lithium level  3.11,lactic acid 4.66, potassium 3, BUN 13, creatinine 2.UDS was negative. Chest x-ray and urinalysis did not show any significant signs of infection. Poison control was contacted recommending aggressive IV fluid hydration and serial lithium and electrolyte monitoring. Patient was given 2 L of normal saline IV fluids, 40 mEq of potassium chloride, and placed on CIWA protocol.  Patient was admitted for Acute Lithium Toxicity and Psychiatry has been called for further evaluation recommendations.  Discharge Diagnoses:  Principal Problem:   Lithium toxicity Active Problems:   Bipolar I disorder (HCC)   SIRS (systemic inflammatory response syndrome) (HCC)   Lactic acidosis   Abnormal TSH   History of alcohol abuse  Acute Lithium Toxicity, improving  -Patient presented with an initial lithium level of 3.11.  -Poison control contacted recommending aggressive fluid hydrationas tolerated, serial monitoring of electrolytes, and neurological changes/seizure activity.  -Patient initially was given 2 L of normal saline IV fluids.  -Repeat checkof patient's lithiumnoted to be down to 2.56 -> 2.38 -> 2.19 -> 1.64 -> 1.13 -Admitted to SDU -Continuous pulse oximetry with nasal cannula oxygen as needed -Neurochecks every 4 hours -Strict I&Os  -C/w IVF Hydration with Normal saline at 150 mL/hr and reduced to 75 mL/hr; IVF now changed to Sodium Bicarbonate  -Acetaminophen Level was <45 and Salicylate Level was <8.0 -Check BMPs every 4 hours for lithium-Induced Nephrogenic Diabetes Insipidusalong with Strict I's/O's -Corrected Electrolytes as needed -Psychiatry consulted for medication adjustment and evaluation and recommending Not initiating Lithium again but recommended resuming Home Prolixin and starting Lamictal 50 mg po qHS -Patient was still being worked up however she became very agitated signed out [place] after extensive discussion with her that she was still being currently  worked up and I wanted to repeat her  labs patient understood these risks being of sound mind and decided to proceed anyway and she understands she has high risk for worsening, decompensation, and even possible death.  SIRS, improved -Acute.Patient presented with tachycardia and tachypnea with WBC 16.6 and initial lactic acid 4.66,but noted to be trending down after IV fluids.  -Urinalysis and chest x-ray showed no acute abnormality.  -Blood cultures were obtained.  -Suspect secondary to aboveand less likely infectious in nature. 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 -BUN/Cr went from 13/2.00 -> 15/1.86 -> 14/1.85 -> 13/1.67 -> 11/1.61 -Aggressive IV fluids as tolerated and changed to Sodium Bicarbonate -Continue to monitor kidney function but patient signed out AMA  History of Alcohol Abuse -Unclear at this time when patient's last drink was but she states it was last week -Ethanol level was <10 -Continued CIWA protocol while hospitalized the patient signed out AGAINST MEDICAL ADVICE  History of Schizoaffective Disorder and Bipolar 1 Disorder -Sees Psychiatrist Dr. Bolivar Haw in the outpatient setting -Have consulted Inpatient Psychiatry for evaluation and Recommendations -Psychiatry adjusted medications and patient will need to follow-up with regular titration outpatient setting. -Patient signed out AGAINST MEDICAL ADVICE  Abnormal TSH -TSH was noted to be 5.615admission. -Checked Free T4 and was 0.83 -Repeat Thyroid Studies as an outpatient -Signed out AGAINST MEDICAL ADVICE  Hypermagnesemia -Patient magnesium level was 3.0 was 2.2 -Continue to monitor  -Patient signed out AGAINST MEDICAL  ADVICE  HypoPhosphatemia -Patient's phosphorus level was 1.4 and improved to 2.5 -Replete with p.o. K-Phos Neutral 500 mg 3 times daily x3 doses yesterday -Continue to monitor and replete as necessary -Patient signed out [place]  Hypokalemia, improved -Patient's potassium on admission was 3.0 and has improved to 3.7 -Continue to monitor and replete as necessary -Patient signed out [place]  Normocytic Anemia -Patient's hemoglobin/hematocrit on admission went from 15.0/44.0 -> 11.6/34.1 -> 11.0/34.8 -Check Anemia Panel as an outpatient  -Likely Dilutional Drop and patient was suspected to be Hemoconcentrated on Admission  -Continue to monitor for signs and symptoms of bleeding -Repeat CBC as an outpatient as patient signed out AMA  Lactic Acidosis, improved  -Improved.   -Given 2 L of IV fluid boluses and started on normal saline at a rate of 150 mL's per hour -Patient's lactic acid level went from 4.66 and is now 1.2 -Continue with IV fluid hydration as above and will not check lactic acid levels again  Hyperchloremia -Likely from IV fluid resuscitation -Change IV fluid hydration from normal saline to sodium bicarb and then will change to D5W -Patient signed out AGAINST MEDICAL ADVICE  Non-anion gap metabolic acidosis -Patient's CO2 is 18 and anion gap was 6 this morning -Changed IV fluids to sodium bicarb however patient signed out [place] -Follow-up with primary care as an outpatient  Discharge Instructions  Discharge Instructions    Call MD for:  difficulty breathing, headache or visual disturbances   Complete by:  As directed    Call MD for:  extreme fatigue   Complete by:  As directed    Call MD for:  hives   Complete by:  As directed    Call MD for:  persistant dizziness or light-headedness   Complete by:  As directed    Call MD for:  persistant nausea and vomiting   Complete by:  As directed    Call MD for:   redness, tenderness, or signs of infection (pain, swelling, redness, odor or green/yellow discharge around incision site)   Complete by:  As directed    Call MD for:  severe uncontrolled pain   Complete by:  As directed    Call MD for:  temperature >100.4   Complete by:  As directed    Diet - low sodium heart healthy   Complete by:  As directed    Discharge instructions   Complete by:  As directed    Follow-up with PCP and psychiatry as an outpatient. Take all medications as prescribed.  If symptoms change or worsen please return to the emergency room for evaluation.   Increase activity slowly   Complete by:  As directed      Allergies as of 10/17/2017      Reactions   Soap Rash      Medication List    STOP taking these medications   lithium 300 MG tablet     TAKE these medications   fluPHENAZine 1 MG tablet Commonly known as:  PROLIXIN Take 1 tablet (1 mg total) by mouth 2 (two) times daily.   lamoTRIgine 200 MG tablet Commonly known as:  LAMICTAL Take 1 tablet (200 mg total) by mouth daily.       Allergies  Allergen Reactions  . Soap Rash   Consultations:  Psychiatry   Procedures/Studies: Dg Chest 2 View  Result Date: 10/15/2017 CLINICAL DATA:  Heat prostration with altered mental status EXAM: CHEST - 2 VIEW COMPARISON:  None. FINDINGS: The lungs are clear. The heart size and pulmonary vascularity are normal. No adenopathy. No bone lesions. IMPRESSION: No edema or consolidation. Electronically Signed   By: Lowella Grip III M.D.   On: 10/15/2017 21:01   Ct Head Wo Contrast  Result Date: 10/15/2017 CLINICAL DATA:  Questionable fall.  Altered mental status EXAM: CT HEAD WITHOUT CONTRAST TECHNIQUE: Contiguous axial images were obtained from the base of the skull through the vertex without intravenous contrast. COMPARISON:  None. FINDINGS: Brain: There is mild diffuse atrophy. There is no intracranial mass, hemorrhage, extra-axial fluid collection, or midline  shift. There is mild small vessel disease in the centra semiovale bilaterally. Elsewhere gray-white compartments appear normal. No evident acute infarct. Vascular: No appreciable hyperdense vessel. There is calcification in both carotid siphon regions. Skull: Bony calvarium appears intact. Sinuses/Orbits: Visualized paranasal sinuses are clear. Visualized orbits appear symmetric bilaterally. Other: Mastoid air cells are clear. IMPRESSION: Atrophy with slight periventricular small vessel disease. No mass or hemorrhage. No evident acute infarct. There are foci of vascular calcification. Electronically Signed   By: Lowella Grip III M.D.   On: 10/15/2017 15:58     Subjective: Seen and examined this morning and patient had refused multiple lab draws.  After extensive discussion with her she finally agreed however prior to her labs being completed she signed out [place] and was adamant about leaving.  I discussed with her about the importance of completely being worked up and ready for discharge however she understood the risks of signing out [place] including decompensation, worsening, and including possible death, and being of sound mind she understood and still signed out [place] and called her brother to come pick her up.  Discharge Exam: Vitals:   10/17/17 0745 10/17/17 0800  BP: Marland Kitchen)  125/57   Pulse: 75   Resp: 18   Temp:  98.2 F (36.8 C)  SpO2: 100%    Vitals:   10/17/17 0600 10/17/17 0700 10/17/17 0745 10/17/17 0800  BP: 102/66 (!) 97/42 (!) 125/57   Pulse: 60 65 75   Resp: 18 20 18    Temp:    98.2 F (36.8 C)  TempSrc:    Axillary  SpO2: 99% 100% 100%   Weight:      Height:       General: Pt is alert, awake, not in acute distress Cardiovascular: RRR, S1/S2 +, no rubs, no gallops Respiratory: Diminished bilaterally, no wheezing, no rhonchi Abdominal: Soft, NT, ND, bowel sounds + Extremities: no edema, no cyanosis  The results of  significant diagnostics from this hospitalization (including imaging, microbiology, ancillary and laboratory) are listed below for reference.    Microbiology: Recent Results (from the past 240 hour(s))  Blood Culture (routine x 2)     Status: None (Preliminary result)   Collection Time: 10/15/17  4:24 PM  Result Value Ref Range Status   Specimen Description   Final    BLOOD RIGHT ANTECUBITAL Performed at [facility] [address] [place], [place] [address]    Special Requests   Final    BOTTLES DRAWN AEROBIC AND ANAEROBIC Blood Culture results may not be optimal due to an excessive volume of blood received in culture bottles Performed at [facility] [address] [place], [place] [address]    Culture   Final    NO GROWTH 2 DAYS Performed at [facility] [address], [place], [place] [address]    Report Status PENDING  Incomplete  MRSA PCR Screening     Status: None   Collection Time: 10/16/17 12:43 AM  Result Value Ref Range Status   MRSA by PCR NEGATIVE NEGATIVE Final    Comment:        The GeneXpert MRSA Assay (FDA approved for NASAL specimens only), is one component of a comprehensive MRSA colonization surveillance program. It is not intended to diagnose MRSA infection nor to guide or monitor treatment for MRSA infections. Performed at [facility], [place] [address], [place], [place] [address]   Blood Culture (routine x 2)     Status: None (Preliminary result)   Collection Time: 10/16/17  5:48 AM  Result Value Ref Range Status   Specimen Description   Final    BLOOD LEFT ARM Performed at [facility] [address], [place], [place] [address]    Special Requests   Final    BOTTLES DRAWN AEROBIC ONLY Blood Culture adequate volume Performed at [facility] [address] [place], [place] [address]    Culture   Final    NO GROWTH < 24 HOURS Performed at  [facility] [address], [place], [place] [address]    Report Status PENDING  Incomplete    Labs: BNP (last 3 results) No results for input(s): BNP in the last 8760 hours. Basic Metabolic Panel: Recent Labs  Lab 10/15/17 1625 10/15/17 1941 10/16/17 0333 10/16/17 0817 10/16/17 1413 10/17/17 0957  NA 130* 134* 136 140 140 145  K 3.0* 3.0* 4.5 4.5 4.1 3.7  CL 102 105 111 116* 117* 121*  CO2 20* 20* 19* 19* 18* 18*  GLUCOSE 116* 129* 102* 92 156* 175*  BUN 13 13 15 14 13 11   CREATININE 1.86* 2.00* 1.86* 1.85*  1.67* 1.61*  CALCIUM 9.3 8.6* 8.8* 8.9 8.7* 9.2  MG 2.7*  --   --  3.0* 2.6* 2.2  PHOS  --   --   --  1.4*  --  2.5   Liver Function Tests: Recent Labs  Lab 10/15/17 1625 10/15/17 1941 10/16/17 0817 10/17/17 0957  AST 33 32 22 26  ALT 34 28 27 28   ALKPHOS 86 79 77 78  BILITOT 0.7 0.4 0.6 0.3  PROT 6.3* 5.6* 5.6* 5.5*  ALBUMIN 3.7 3.4* 3.3* 3.2*   No results for input(s): LIPASE, AMYLASE in the last 168 hours. Recent Labs  Lab 10/15/17 1625  AMMONIA 29   CBC: Recent Labs  Lab 10/15/17 1502 10/15/17 1533 10/16/17 0548 10/17/17 0957  WBC 16.6*  --  8.8 6.2  NEUTROABS 14.9*  --  6.2 4.3  HGB 15.1* 15.0 11.6* 11.0*  HCT 42.0 44.0 34.1* 34.8*  MCV 92.7  --  95.8 102.4*  PLT 240  --  184 109*   Cardiac Enzymes: Recent Labs  Lab 10/15/17 1625  CKTOTAL 78   BNP: Invalid input(s): POCBNP CBG: Recent Labs  Lab 10/15/17 1525  GLUCAP 141*   D-Dimer No results for input(s): DDIMER in the last 72 hours. Hgb A1c No results for input(s): HGBA1C in the last 72 hours. Lipid Profile No results for input(s): CHOL, HDL, LDLCALC, TRIG, CHOLHDL, LDLDIRECT in the last 72 hours. Thyroid function studies Recent Labs    10/15/17 1523  TSH 5.615*   Anemia work up No results for input(s): VITAMINB12, FOLATE, FERRITIN, TIBC, IRON, RETICCTPCT in the last 72 hours. Urinalysis    Component Value Date/Time   COLORURINE STRAW (A) 10/15/2017 1920    APPEARANCEUR CLEAR 10/15/2017 1920   LABSPEC 1.003 (L) 10/15/2017 1920   PHURINE 7.0 10/15/2017 1920   GLUCOSEU NEGATIVE 10/15/2017 1920   HGBUR NEGATIVE 10/15/2017 1920   BILIRUBINUR NEGATIVE 10/15/2017 1920   KETONESUR NEGATIVE 10/15/2017 1920   PROTEINUR NEGATIVE 10/15/2017 1920   NITRITE NEGATIVE 10/15/2017 1920   LEUKOCYTESUR NEGATIVE 10/15/2017 1920   Sepsis Labs Invalid input(s): PROCALCITONIN,  WBC,  LACTICIDVEN Microbiology Recent Results (from the past 240 hour(s))  Blood Culture (routine x 2)     Status: None (Preliminary result)   Collection Time: 10/15/17  4:24 PM  Result Value Ref Range Status   Specimen Description   Final    BLOOD RIGHT ANTECUBITAL Performed at [facility], [place] [address], [place], [place] [address]    Special Requests   Final    BOTTLES DRAWN AEROBIC AND ANAEROBIC Blood Culture results may not be optimal due to an excessive volume of blood received in culture bottles Performed at [facility] [address] [place], [place] [address]    Culture   Final    NO GROWTH 2 DAYS Performed at [facility] [address], [place], [place] [address]    Report Status PENDING  Incomplete  MRSA PCR Screening     Status: None   Collection Time: 10/16/17 12:43 AM  Result Value Ref Range Status   MRSA by PCR NEGATIVE NEGATIVE Final    Comment:        The GeneXpert MRSA Assay (FDA approved for NASAL specimens only), is one component of a comprehensive MRSA colonization surveillance program. It is not intended to diagnose MRSA infection nor to guide or monitor treatment for MRSA infections. Performed at [facility], [place] [address], [place], [place] [address]  Blood Culture (routine x 2)     Status: None (Preliminary result)   Collection Time: 10/16/17  5:48 AM  Result Value Ref Range Status   Specimen Description   Final    BLOOD LEFT ARM Performed at [facility] [address], [place], [place] [address]    Special Requests   Final    BOTTLES DRAWN AEROBIC ONLY Blood Culture adequate volume Performed at [facility] [address], [place], [place] [address]    Culture   Final    NO GROWTH < 24 HOURS Performed at [facility] [address], [place], [place] [address]    Report Status PENDING  Incomplete   Time coordinating discharge: 25 minutes  SIGNED:  Kerney Elbe, DO [facility] 10/17/2017, 12:55 PM Pager [phone]  If 7PM-7AM, please contact night-coverage [email or web address] Password [email or web address]

## 2017-10-17 NOTE — Progress Notes (Signed)
PATIENT DECIDED TO LEAVE THE PT'S ROOM AND WAIT IN THE LOBBY FOR HER BROTHER; BROTHER NOTIFIED; REPORTS UNDERSTANDING.

## 2017-10-17 NOTE — Progress Notes (Signed)
Spoke with Mickle Plumb, who advised he would be arriving to take her back to [facility] and that he or his spouse would be caring for her over the next few days.

## 2017-10-17 NOTE — Progress Notes (Signed)
Removed IVs, assisted with dressing.  Explained risks for leaving AMA, pt verbalized that she understood.  Signed form.  Pt waiting for brother to pick her up.

## 2017-10-17 NOTE — Progress Notes (Signed)
The patient is adamant about leaving the hospital. MD aware.

## 2017-10-17 NOTE — Progress Notes (Signed)
PATIENT REFUSING ANY LAB DRAW OR STICKS FROM RN AND/OR LAB TECH. PATIENT REPORTS SHE IS BRUISED.

## 2017-10-20 LAB — CULTURE, BLOOD (ROUTINE X 2): Culture: NO GROWTH

## 2017-10-21 LAB — CULTURE, BLOOD (ROUTINE X 2)
CULTURE: NO GROWTH
SPECIAL REQUESTS: ADEQUATE

## 2018-01-16 ENCOUNTER — Other Ambulatory Visit (HOSPITAL_COMMUNITY): Payer: Self-pay | Admitting: Psychiatry

## 2018-02-01 ENCOUNTER — Other Ambulatory Visit (HOSPITAL_COMMUNITY): Payer: Self-pay | Admitting: Psychiatry

## 2018-03-09 ENCOUNTER — Other Ambulatory Visit (HOSPITAL_COMMUNITY): Payer: Self-pay

## 2018-03-09 ENCOUNTER — Other Ambulatory Visit (HOSPITAL_COMMUNITY): Payer: Self-pay | Admitting: Psychiatry

## 2018-03-09 MED ORDER — LITHIUM CARBONATE 150 MG PO CAPS: 150.0000 mg | ORAL_CAPSULE | Freq: Two times a day (BID) | ORAL | 1 refills | Status: DC

## 2018-03-09 MED ORDER — LAMOTRIGINE 200 MG PO TABS: 200.0000 mg | ORAL_TABLET | Freq: Every day | ORAL | 1 refills | Status: DC

## 2018-03-09 MED ORDER — FLUPHENAZINE HCL 1 MG PO TABS: 1.0000 mg | ORAL_TABLET | Freq: Two times a day (BID) | ORAL | 1 refills | Status: DC

## 2018-04-14 ENCOUNTER — Ambulatory Visit (HOSPITAL_COMMUNITY): Payer: 59 | Admitting: Psychiatry

## 2018-04-14 ENCOUNTER — Ambulatory Visit (INDEPENDENT_AMBULATORY_CARE_PROVIDER_SITE_OTHER): Payer: 59 | Admitting: Psychiatry

## 2018-04-14 ENCOUNTER — Encounter (HOSPITAL_COMMUNITY): Payer: Self-pay | Admitting: Psychiatry

## 2018-04-14 VITALS — BP 122/74 | Ht 64.0 in | Wt 123.0 lb

## 2018-04-14 DIAGNOSIS — F209 Schizophrenia, unspecified: Secondary | ICD-10-CM

## 2018-04-14 MED ORDER — LAMOTRIGINE 200 MG PO TABS: 200.0000 mg | ORAL_TABLET | Freq: Every day | ORAL | 1 refills | Status: DC

## 2018-04-14 MED ORDER — LAMOTRIGINE 200 MG PO TABS: 200.0000 mg | ORAL_TABLET | Freq: Every day | ORAL | 3 refills | Status: DC

## 2018-04-14 MED ORDER — ARIPIPRAZOLE ER 400 MG IM PRSY
400.0000 mg | PREFILLED_SYRINGE | Freq: Once | INTRAMUSCULAR | Status: AC
  Administered 2018-04-18: 400 mg via INTRAMUSCULAR

## 2018-04-14 NOTE — Progress Notes (Signed)
Psychiatric Initial Adult Assessment   Patient Identification: Megan Miranda MRN:  [id number] Date of Evaluation:  04/14/2018 Referral Source: An old patient Chief Complaint:   Visit Diagnosis: Schizophrenia  History of Present Illness: This patient is well-known to me.  She is a 57 year old female seen with her brother [name].  Her brother looks after her.  Unfortunately the patient has not been seen for almost a year and a half.  When she was last seen she was well-dressed rational clear thinking and could not express herself.  Today she appears disheveled dirty and scattered.  She has been missing appointments multiple times because she has does not want to come.  Finally her brother insisted brought her today.  Patient has lost weight.  She denies hearing voices or seeing visions.  She is not paranoid.  She describes himself as someone wants to be left alone.  Patient was living in a townhouse and is to have someone come in to help her but she is now asked them not to come.  She is very living a very isolated lifestyle and she is clearly regressed.  She is erratic about taking her lithium.  She is inconsistent about any of her medicines.  The patient denies being suicidal.  She is not functioning well at all.  Her brother says that when the seizure she is pleasant is never aggressive but clearly is not taking care of herself.  The patient denies being homicidal or suicidal.  She clearly has a chronic psychotic disorder.  In this interview she is somewhat scattered somewhat inappropriate and that she does not realize the severity of how she is changed.  She apparently continues taking care of her basic ADLs only fairly well but cannot do any more of her institutional ADLs.  Associated Signs/Symptoms: Depression Symptoms:  anxiety, (Hypo) Manic Symptoms:   Anxiety Symptoms:   Psychotic Symptoms:   PTSD Symptoms:   Past Psychiatric History: Multiple psychotropic medications  Previous Psychotropic  Medications:   Substance Abuse History in the last 12 months:    Consequences of Substance Abuse:   Past Medical History:  Past Medical History:  Diagnosis Date  . Bipolar disorder ([place])    History reviewed. No pertinent surgical history.  Family Psychiatric History:   Family History: History reviewed. No pertinent family history.  Social History:   Social History   Socioeconomic History  . Marital status: Married    Spouse name: Not on file  . Number of children: Not on file  . Years of education: Not on file  . Highest education level: Not on file  Occupational History  . Not on file  Social Needs  . Financial resource strain: Not on file  . Food insecurity:    Worry: Not on file    Inability: Not on file  . Transportation needs:    Medical: Not on file    Non-medical: Not on file  Tobacco Use  . Smoking status: Current Some Day Smoker    Packs/day: 0.10    Years: 2.00    Pack years: 0.20  . Smokeless tobacco: Never Used  . Tobacco comment: Smokes a few cigarettes a week.   Substance and Sexual Activity  . Alcohol use: No  . Drug use: No  . Sexual activity: Never  Lifestyle  . Physical activity:    Days per week: Not on file    Minutes per session: Not on file  . Stress: Not on file  Relationships  . Social  connections:    Talks on phone: Not on file    Gets together: Not on file    Attends religious service: Not on file    Active member of club or organization: Not on file    Attends meetings of clubs or organizations: Not on file    Relationship status: Not on file  Other Topics Concern  . Not on file  Social History Narrative  . Not on file    Additional Social History:   Allergies:   Allergies  Allergen Reactions  . Soap Rash    Metabolic Disorder Labs: No results found for: HGBA1C, MPG No results found for: PROLACTIN No results found for: CHOL, TRIG, HDL, CHOLHDL, VLDL, LDLCALC Lab Results  Component Value Date   TSH 5.615 (H)  10/15/2017    Therapeutic Level Labs: Lab Results  Component Value Date   LITHIUM 1.13 10/17/2017   No results found for: CBMZ No results found for: VALPROATE  Current Medications: Current Outpatient Medications  Medication Sig Dispense Refill  . fluPHENAZine (PROLIXIN) 1 MG tablet Take 1 tablet (1 mg total) by mouth 2 (two) times daily. 60 tablet 1  . lamoTRIgine (LAMICTAL) 200 MG tablet Take 1 tablet (200 mg total) by mouth daily. 30 tablet 1  . lithium carbonate 150 MG capsule Take 1 capsule (150 mg total) by mouth 2 (two) times daily with a meal. 60 capsule 1   No current facility-administered medications for this visit.     Musculoskeletal: Strength & Muscle Tone: within normal limits Gait & Station: normal Patient leans: N/A  Psychiatric Specialty Exam: ROS  Blood pressure 122/74, height 5\' 4"  (1.626 m), weight 123 lb (55.8 kg).Body mass index is 21.11 kg/m.  General Appearance: Disheveled  Eye Contact:  Fair  Speech:  Clear and Coherent  Volume:  Normal  Mood:  Negative  Affect:  Blunt  Thought Process:  Coherent  Orientation:  NA  Thought Content:  WDL  Suicidal Thoughts:  No  Homicidal Thoughts:  No  Memory:  Negative  Judgement:  Poor  Insight:  Lacking  Psychomotor Activity:  Decreased  Concentration:    Recall:  [employer] of Knowledge:Poor  Language: Fair  Akathisia:  No  Handed:  Right  AIMS (if indicated):  not done  Assets:  Financial Resources/Insurance  ADL's:  Impaired  Cognition: Impaired,  Moderate  Sleep:  Fair   Screenings:   Assessment and Plan:   It is apparent that this time the patient has not been taking her medicines and is decompensated.  Today we will begin her on Abilify 10 mg at night for the next 6 months and then she will begin Abilify 400 mg maintain.  Patient continue taking her Lamictal 200 mg.  At this time we will discontinue her lithium.  Patient was seen together with her brother [name] who says he will bring her  next week for her shot.  She get a shot to be seen every month return to see me in about 5 weeks from now.  Patient is not suicidal.  He denies chest pain or shortness of breath.  She denies any physical complaints at this time.  She is disheveled and not clean.  Her hair is not trimmed.  Her hygiene is very poor.  Jerral Ralph, MD 11/21/20194:23 PM

## 2018-04-18 ENCOUNTER — Ambulatory Visit (INDEPENDENT_AMBULATORY_CARE_PROVIDER_SITE_OTHER): Payer: 59

## 2018-04-18 DIAGNOSIS — F3162 Bipolar disorder, current episode mixed, moderate: Secondary | ICD-10-CM

## 2018-04-18 NOTE — Progress Notes (Signed)
Patient arrived today with flat affect and good mood for her first injection of Abilify Maintena 400 mg injection. Patient was with her guardian -  her brother. Neither patient nor her brother had any questions for this Probation officer. Injection was prepared as ordered and was administered in the patients upper outer left gluteal quadrant. Patient tolerated well and will return in 28 days. Patient will call with any questions or concerns.

## 2018-05-03 ENCOUNTER — Ambulatory Visit (HOSPITAL_COMMUNITY): Payer: 59 | Admitting: Psychiatry

## 2018-11-28 ENCOUNTER — Encounter (HOSPITAL_COMMUNITY): Payer: Self-pay | Admitting: Student

## 2018-11-28 ENCOUNTER — Emergency Department (HOSPITAL_BASED_OUTPATIENT_CLINIC_OR_DEPARTMENT_OTHER): Payer: 59

## 2018-11-28 ENCOUNTER — Other Ambulatory Visit: Payer: Self-pay

## 2018-11-28 ENCOUNTER — Emergency Department (HOSPITAL_COMMUNITY): Payer: 59

## 2018-11-28 ENCOUNTER — Emergency Department (HOSPITAL_COMMUNITY)
Admission: EM | Admit: 2018-11-28 | Discharge: 2018-12-06 | Disposition: A | Discharge status: Other Acute Inpatient Hospital | Payer: 59 | Attending: Emergency Medicine | Admitting: Emergency Medicine

## 2018-11-28 DIAGNOSIS — M79609 Pain in unspecified limb: Secondary | ICD-10-CM | POA: Diagnosis not present

## 2018-11-28 DIAGNOSIS — R443 Hallucinations, unspecified: Secondary | ICD-10-CM

## 2018-11-28 DIAGNOSIS — R440 Auditory hallucinations: Secondary | ICD-10-CM | POA: Diagnosis not present

## 2018-11-28 DIAGNOSIS — Z046 Encounter for general psychiatric examination, requested by authority: Secondary | ICD-10-CM | POA: Diagnosis present

## 2018-11-28 DIAGNOSIS — M7989 Other specified soft tissue disorders: Secondary | ICD-10-CM

## 2018-11-28 DIAGNOSIS — F319 Bipolar disorder, unspecified: Secondary | ICD-10-CM | POA: Diagnosis not present

## 2018-11-28 DIAGNOSIS — F29 Unspecified psychosis not due to a substance or known physiological condition: Secondary | ICD-10-CM

## 2018-11-28 DIAGNOSIS — Z20828 Contact with and (suspected) exposure to other viral communicable diseases: Secondary | ICD-10-CM | POA: Insufficient documentation

## 2018-11-28 DIAGNOSIS — L538 Other specified erythematous conditions: Secondary | ICD-10-CM

## 2018-11-28 DIAGNOSIS — Z720 Tobacco use: Secondary | ICD-10-CM | POA: Diagnosis not present

## 2018-11-28 DIAGNOSIS — R441 Visual hallucinations: Secondary | ICD-10-CM | POA: Insufficient documentation

## 2018-11-28 DIAGNOSIS — L03115 Cellulitis of right lower limb: Secondary | ICD-10-CM | POA: Diagnosis not present

## 2018-11-28 DIAGNOSIS — Z23 Encounter for immunization: Secondary | ICD-10-CM | POA: Diagnosis not present

## 2018-11-28 DIAGNOSIS — Z79899 Other long term (current) drug therapy: Secondary | ICD-10-CM | POA: Insufficient documentation

## 2018-11-28 DIAGNOSIS — F250 Schizoaffective disorder, bipolar type: Secondary | ICD-10-CM | POA: Diagnosis not present

## 2018-11-28 LAB — ACETAMINOPHEN LEVEL: Acetaminophen (Tylenol), Serum: <10 ug/mL — ABNORMAL LOW (ref 10–30)

## 2018-11-28 LAB — RAPID URINE DRUG SCREEN, HOSP PERFORMED
Amphetamines: NOT DETECTED
Barbiturates: NOT DETECTED
Benzodiazepines: NOT DETECTED
Cocaine: NOT DETECTED
Opiates: NOT DETECTED
Tetrahydrocannabinol: NOT DETECTED

## 2018-11-28 LAB — COMPREHENSIVE METABOLIC PANEL
ALT: 28 U/L (ref 0–44)
AST: 35 U/L (ref 15–41)
Albumin: 3.8 g/dL (ref 3.5–5.0)
Alkaline Phosphatase: 49 U/L (ref 38–126)
Anion gap: 8 (ref 5–15)
BUN: 15 mg/dL (ref 6–20)
CO2: 20 mmol/L — ABNORMAL LOW (ref 22–32)
Calcium: 9.6 mg/dL (ref 8.9–10.3)
Chloride: 114 mmol/L — ABNORMAL HIGH (ref 98–111)
Creatinine, Ser: 1.48 mg/dL — ABNORMAL HIGH (ref 0.44–1.00)
GFR calc Af Amer: 44 mL/min — ABNORMAL LOW (ref >60)
GFR calc non Af Amer: 38 mL/min — ABNORMAL LOW (ref >60)
Glucose, Bld: 93 mg/dL (ref 70–99)
Potassium: 3.3 mmol/L — ABNORMAL LOW (ref 3.5–5.1)
Sodium: 142 mmol/L (ref 135–145)
Total Bilirubin: 0.6 mg/dL (ref 0.3–1.2)
Total Protein: 6.1 g/dL — ABNORMAL LOW (ref 6.5–8.1)

## 2018-11-28 LAB — CBC
HCT: 34.1 % — ABNORMAL LOW (ref 36.0–46.0)
Hemoglobin: 11.2 g/dL — ABNORMAL LOW (ref 12.0–15.0)
MCH: 30.9 pg (ref 26.0–34.0)
MCHC: 32.8 g/dL (ref 30.0–36.0)
MCV: 94.2 fL (ref 80.0–100.0)
Platelets: 176 10*3/uL (ref 150–400)
RBC: 3.62 MIL/uL — ABNORMAL LOW (ref 3.87–5.11)
RDW: 14.4 % (ref 11.5–15.5)
WBC: 4.2 10*3/uL (ref 4.0–10.5)
nRBC: 0 % (ref 0.0–0.2)

## 2018-11-28 LAB — ETHANOL: Alcohol, Ethyl (B): 10 mg/dL — ABNORMAL HIGH (ref <10)

## 2018-11-28 LAB — C-REACTIVE PROTEIN: CRP: <0.8 mg/dL (ref <1.0)

## 2018-11-28 LAB — SALICYLATE LEVEL: Salicylate Lvl: 8.3 mg/dL (ref 2.8–30.0)

## 2018-11-28 LAB — SEDIMENTATION RATE: Sed Rate: 12 mm/hr (ref 0–22)

## 2018-11-28 LAB — SARS CORONAVIRUS 2 BY RT PCR (HOSPITAL ORDER, PERFORMED IN ~~LOC~~ HOSPITAL LAB): SARS Coronavirus 2: NEGATIVE

## 2018-11-28 LAB — LITHIUM LEVEL: Lithium Lvl: <0.06 mmol/L — ABNORMAL LOW (ref 0.60–1.20)

## 2018-11-28 MED ORDER — LORAZEPAM 2 MG/ML IJ SOLN
0.0000 mg | Freq: Two times a day (BID) | INTRAMUSCULAR | Status: AC
  Filled 2018-11-28: qty 1

## 2018-11-28 MED ORDER — FLUPHENAZINE HCL 1 MG PO TABS
1.0000 mg | ORAL_TABLET | Freq: Two times a day (BID) | ORAL | Status: DC
  Administered 2018-11-28 – 2018-12-01 (×6): 1 mg via ORAL
  Filled 2018-11-28 (×11): qty 1

## 2018-11-28 MED ORDER — TETANUS-DIPHTH-ACELL PERTUSSIS 5-2.5-18.5 LF-MCG/0.5 IM SUSP
0.5000 mL | Freq: Once | INTRAMUSCULAR | Status: AC
  Administered 2018-11-28: 0.5 mL via INTRAMUSCULAR
  Filled 2018-11-28: qty 0.5

## 2018-11-28 MED ORDER — IBUPROFEN 400 MG PO TABS: 600.0000 mg | ORAL_TABLET | Freq: Three times a day (TID) | ORAL | Status: DC | PRN

## 2018-11-28 MED ORDER — VITAMIN B-1 100 MG PO TABS
100.0000 mg | ORAL_TABLET | Freq: Every day | ORAL | Status: DC
  Administered 2018-11-28 – 2018-12-06 (×9): 100 mg via ORAL
  Filled 2018-11-28 (×9): qty 1

## 2018-11-28 MED ORDER — LITHIUM CARBONATE 150 MG PO CAPS
150.0000 mg | ORAL_CAPSULE | Freq: Two times a day (BID) | ORAL | Status: DC
  Administered 2018-11-28 – 2018-12-06 (×15): 150 mg via ORAL
  Filled 2018-11-28 (×20): qty 1

## 2018-11-28 MED ORDER — LORAZEPAM 1 MG PO TABS
0.0000 mg | ORAL_TABLET | Freq: Four times a day (QID) | ORAL | Status: AC
  Administered 2018-11-28: 2 mg via ORAL
  Administered 2018-11-29 (×3): 1 mg via ORAL
  Filled 2018-11-28 (×2): qty 1
  Filled 2018-11-28: qty 2
  Filled 2018-11-28: qty 1

## 2018-11-28 MED ORDER — LORAZEPAM 2 MG/ML IJ SOLN
0.0000 mg | Freq: Four times a day (QID) | INTRAMUSCULAR | Status: AC
Start: 2018-11-28 — End: 2018-11-30

## 2018-11-28 MED ORDER — ONDANSETRON HCL 4 MG PO TABS: 4.0000 mg | ORAL_TABLET | Freq: Three times a day (TID) | ORAL | Status: DC | PRN

## 2018-11-28 MED ORDER — ALUM & MAG HYDROXIDE-SIMETH 200-200-20 MG/5ML PO SUSP: 30.0000 mL | Freq: Four times a day (QID) | ORAL | Status: DC | PRN

## 2018-11-28 MED ORDER — LORAZEPAM 1 MG PO TABS: 0.0000 mg | ORAL_TABLET | Freq: Two times a day (BID) | ORAL | Status: AC

## 2018-11-28 MED ORDER — DOXYCYCLINE HYCLATE 100 MG PO TABS
100.0000 mg | ORAL_TABLET | Freq: Two times a day (BID) | ORAL | Status: AC
  Administered 2018-11-28 – 2018-12-05 (×13): 100 mg via ORAL
  Filled 2018-11-28 (×15): qty 1

## 2018-11-28 MED ORDER — THIAMINE HCL 100 MG/ML IJ SOLN: 100.0000 mg | Freq: Every day | INTRAMUSCULAR | Status: DC

## 2018-11-28 MED ORDER — LAMOTRIGINE 100 MG PO TABS
200.0000 mg | ORAL_TABLET | Freq: Every day | ORAL | Status: DC
  Administered 2018-11-28 – 2018-12-06 (×9): 200 mg via ORAL
  Filled 2018-11-28 (×10): qty 2

## 2018-11-28 MED ORDER — ACETAMINOPHEN 500 MG PO TABS: 500.0000 mg | ORAL_TABLET | Freq: Four times a day (QID) | ORAL | Status: DC | PRN

## 2018-11-28 NOTE — ED Notes (Signed)
Pt is sleeping right now, ciwa 0

## 2018-11-28 NOTE — ED Notes (Signed)
Called staffing to see about sitter availability for 2300. There will be no sitters after 2300 per staffing. Charge RN aware.

## 2018-11-28 NOTE — ED Triage Notes (Signed)
Patient arrived by [facility] for IVC by patients brother. Patient reports that she is bipolar and hasnt taken her meds because she is out. Arrived with right swollen ankle and redness to same, denies trauma. Patient reports daily etoh use and denies drugs. Smoker and refused nicotine patch. [employer] at bedside. Patient calm and cooperative. Lives alone, NAD. Denies suicidal and homicidal ideation

## 2018-11-28 NOTE — BH Assessment (Signed)
Clinician attempted to complete assessment with pt but she was asleep and was unable to be aroused. Pt's nurse, Lonn Georgia RN, checked pt's chart and she had not been medicated. Clinician provided pt's nurse clinician's direct number and she agreed to call when pt was more alert.

## 2018-11-28 NOTE — Progress Notes (Signed)
Right lower extremity venous duplex completed. Preliminary results in Chart review CV Proc. [place] Minnah Llamas,RVS 11/28/2018, 4:13 PM

## 2018-11-28 NOTE — ED Notes (Signed)
Pt. Belongings inventoried and placed in locker #2

## 2018-11-28 NOTE — ED Provider Notes (Cosign Needed)
[facility] EMERGENCY DEPARTMENT Provider Note   CSN: [id number] Arrival date & time: 11/28/18  1407     History   Chief Complaint Chief Complaint  Patient presents with  . Hallucinations    HPI Megan Miranda is a 58 y.o. female with a hx of bipolar disorder, tobacco abuse, prior lithium toxicity, & EtOH abuse who presents to the ED via [employer] under IVC.   Per IVC paperwork:  " Danger to self and others, 2 with: Diagnosed with schizoid affective disorder/bipolar in 1989; Refuses to take meds; Called 911 yesterday because she thought someone was attacking her; writes on her walls; slurred speech; consumes 6-12 beers per day; having visual/auditory hallucinations as evidenced by telling brother inanimate objects talk to her and seeing shadows"  Per patient states that she misses her medication sometimes, she is requesting medication changes to her psychiatric med list, she denies SI, HI, or hallucinations.  Denies alleviating or aggravating factors.  She complains of left ankle swelling that is been there for a few months, she states she thinks she injured it couple of months ago, but is not sure, states it is not painful, denies numbness, tingling, or weakness. States she last consumed EtOH yesterday.      HPI  Past Medical History:  Diagnosis Date  . Bipolar disorder [facility])     Patient Active Problem List   Diagnosis Date Noted  . SIRS (systemic inflammatory response syndrome) ([place]) 10/16/2017  . Lactic acidosis 10/16/2017  . Abnormal TSH 10/16/2017  . History of alcohol abuse 10/16/2017  . Lithium toxicity 10/15/2017  . Bipolar I disorder ([place]) 07/21/2007    History reviewed. No pertinent surgical history.   OB History   No obstetric history on file.      Home Medications    Prior to Admission medications   Medication Sig Start Date End Date Taking? Authorizing Provider  fluPHENAZine (PROLIXIN) 1 MG tablet Take 1 tablet (1 mg total) by mouth 2 (two)  times daily. 03/09/18   Plovsky, Berneta Sages, MD  lamoTRIgine (LAMICTAL) 200 MG tablet Take 1 tablet (200 mg total) by mouth daily. 04/14/18   Norma Fredrickson, MD  lithium carbonate 150 MG capsule Take 1 capsule (150 mg total) by mouth 2 (two) times daily with a meal. 03/09/18 03/09/19  Norma Fredrickson, MD    Family History History reviewed. No pertinent family history.  Social History Social History   Tobacco Use  . Smoking status: Current Some Day Smoker    Packs/day: 0.10    Years: 2.00    Pack years: 0.20  . Smokeless tobacco: Never Used  . Tobacco comment: Smokes a few cigarettes a week.   Substance Use Topics  . Alcohol use: No  . Drug use: No     Allergies   Soap   Review of Systems Review of Systems  Constitutional: Negative for chills and fever.  Cardiovascular: Positive for leg swelling.  Gastrointestinal: Negative for vomiting.  Musculoskeletal: Positive for joint swelling. Negative for arthralgias and myalgias.  Neurological: Negative for syncope, weakness and numbness.  Psychiatric/Behavioral: Negative for hallucinations (per patient) and suicidal ideas.  All other systems reviewed and are negative.   Physical Exam Updated Vital Signs BP 115/66 (BP Location: Right Arm)   Pulse 76   Temp 98.6 F (37 C) (Oral)   Resp 16   SpO2 100%   Physical Exam Vitals signs and nursing note reviewed.  Constitutional:      General: She is not in  acute distress.    Appearance: She is not ill-appearing or toxic-appearing.     Comments: Disheveled somewhat.   HENT:     Head: Normocephalic and atraumatic. No raccoon eyes or Battle's sign.  Neck:     Musculoskeletal: Normal range of motion and neck supple. No spinous process tenderness.  Cardiovascular:     Pulses:          Dorsalis pedis pulses are 2+ on the right side and 2+ on the left side.       Posterior tibial pulses are 2+ on the right side and 2+ on the left side.  Pulmonary:     Effort: Pulmonary effort is  normal.  Musculoskeletal:     Comments: Back: No midline tenderness to palpation. Upper extremities: small abrasion to the L wrist- no active bleeding, normal AROM, nontender.  Lower extremities: 2+ pitting edema to distal 1/2 of the right lower leg extending to the foot. No significant erythema/warmth/open wounds. LLE is not edematous. Patient has intact AROM to bilateral hips, knees, ankles, and all digits. Nontender to palpation.   Skin:    General: Skin is warm and dry.     Capillary Refill: Capillary refill takes less than 2 seconds.  Neurological:     General: No focal deficit present.     Mental Status: She is alert.     Comments: Alert. Sensation grossly intact to bilateral upper/lower extremities. 5/5 symmetric grip strength & strength with plantar/dorsiflexion bilaterally. Patient ambulatory.  Psychiatric:        Behavior: Behavior is agitated.    ED Treatments / Results  Labs (all labs ordered are listed, but only abnormal results are displayed) Labs Reviewed - No data to display  EKG None  Radiology No results found.  Procedures Procedures (including critical care time)  Medications Ordered in ED Medications - No data to display   Initial Impression / Assessment and Plan / ED Course  I have reviewed the triage vital signs and the nursing notes.  Pertinent labs & imaging results that were available during my care of the patient were reviewed by me and considered in my medical decision making (see chart for details).   Patient presents to the emergency department under IVC by her brother for noncompliance with psychiatric medications with hallucinations/responding to internal stimuli. Upon arrival patient noted to be agitated. She has complaints of R ankle swelling for past few months- does have notable pitting edema to the RLE which is asymmetric, NVI distally, does not seem consistent w/ infectious process. Plan for screening labs as well as R ankle x-ray & RLE  venous duplex.   First examination IVC paperwork completed.  Placed on CIWA protocol.  Findings and plan of care discussed with supervising physician Dr. Vanita Panda who has evaluated patient & is in agreement.   Patient care signed out to Margarita Mail PA-C at change of shift pending labs & imaging. Will need TTS assessment.   Final Clinical Impressions(s) / ED Diagnoses   Final diagnoses:  None    ED Discharge Orders    None       [name], PA-C 11/28/18 1525

## 2018-11-28 NOTE — ED Provider Notes (Signed)
3:34 PM BP 115/66   Pulse 76   Temp 98.6 F (37 C) (Oral)   Resp 16   SpO2 100%   Pt taken at shift hand off from PA  IVC Waiting for DVT study / xray/ labs If neg then psych hold orders  4:42 PM  Patient resting comfortably in the hallway.  I reviewed the patient's labs which shows no elevated leukocytosis, mild renal insufficiency which appears to be chronic and hypokalemia.  Lithium levels below standard value, no Tylenol toxicity, alcohol level barely readable.  Patient does not appear to be in acute withdrawal.  Her UDS is negative. I personally reviewed the patient's vascular ultrasound which is negative for DVT however the technician does note she has a large lymph node.  I examined the patient's right lower extremity which is warm and red and will treat for assumed cellulitis.  I also personally reviewed the patient's foot x-ray which shows extensive but old cortical irregularity consistent with potential midfoot fracture.    6:23 PM BP 122/70   Pulse 82   Temp 98.6 F (37 C) (Oral)   Resp 16   SpO2 100%  I spoke with Dr. Lucia Gaskins who reviewed the films with me.  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  Sed rate and CRP are negative.  He recommends a cam walker boot.  Patient otherwise medically clear   Margarita Mail, PA-C 11/29/18 0024    Dorie Rank, MD 12/01/18 [phone]

## 2018-11-29 NOTE — ED Notes (Signed)
TTS has been done. Waiting on the counselor to call after she has spoken to the patients brother.

## 2018-11-29 NOTE — ED Notes (Signed)
Patient agitated this morning because she says she has money with her and she wants to order herself flowers and buy a calendar for her room.  I told her we are waiting for her to get admitted at this time and we are not able to have excess items in these rooms.

## 2018-11-29 NOTE — ED Notes (Signed)
While tech was changing patient she asked me to observe and area on her pelvic area that is bruised and raised (swollen) Pt said she could have fallen at where she was before she came here.
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
## 2018-11-29 NOTE — ED Provider Notes (Signed)
Emergency Medicine Observation Re-evaluation Note  Megan Miranda is a 58 y.o. female, seen on rounds today.  Pt initially presented to the ED for complaints of Hallucinations Currently, the patient is calmly lying in bed.  Patient arrived yesterday under IVC.  In short, it stated patient is a danger to herself and others, noncompliant with medications, and experiencing visual/auditory hallucinations.  Please see IVC paperwork for exact language.  She also reportedly drinks 6-12 beers per day.  8:45 AM During my interview with patient, she has no current complaints.  She is requesting "a flower and a calendar."   Physical Exam  BP 124/70 (BP Location: Right Arm)   Pulse 60   Temp 98.4 F (36.9 C) (Axillary)   Resp 16   SpO2 100%  Physical Exam Vitals signs and nursing note reviewed.  Constitutional:      General: She is not in acute distress.    Appearance: She is well-developed. She is not diaphoretic.  HENT:     Head: Normocephalic and atraumatic.  Eyes:     Conjunctiva/sclera: Conjunctivae normal.  Neck:     Musculoskeletal: Neck supple.  Cardiovascular:     Rate and Rhythm: Normal rate and regular rhythm.  Pulmonary:     Effort: Pulmonary effort is normal.  Skin:    General: Skin is warm and dry.     Coloration: Skin is not pale.  Neurological:     Mental Status: She is alert.     Comments: No noted tremor or other involuntary movements.  Psychiatric:        Behavior: Behavior normal.     ED Course / MDM  EKG:  Clinical Course as of Nov 28 832  Mon Nov 28, 2018  2245 Sed Rate: 12 [AH]    Clinical Course User Index [AH] Margarita Mail, PA-C   I have reviewed the labs performed to date as well as medications administered while in observation.  Patient required 1 mg p.o. Ativan this morning at around 8:30 AM due to agitation.  This seems to be effective. Plan  Current plan is for inpatient psychiatric admission. Patient is under full IVC at this  time.    Vitals:   11/28/18 1630 11/28/18 1802 11/29/18 0010 11/29/18 0542  BP: 115/66 122/70 (!) 110/58 124/70  Pulse: 76 82 60 60  Resp:   16 16  Temp:   97.8 F (36.6 C) 98.4 F (36.9 C)  TempSrc:   Oral Axillary  SpO2:   100% 100%      Lorayne Bender, PA-C 11/29/18 0900    Blanchie Dessert, MD 12/02/18 2130

## 2018-11-29 NOTE — ED Notes (Signed)
Spoke with pharmacy who will send medication via the tube station.

## 2018-11-29 NOTE — ED Notes (Addendum)
Tried to call patient assessment to let them know patient is awake and could do TTS. No answer at this time

## 2018-11-29 NOTE — ED Triage Notes (Signed)
Pt  To weak to stand for shower . Sitter stayed with Pt during shower and pt sat on a BSC.

## 2018-11-29 NOTE — ED Notes (Signed)
Breakfast tray ordered 

## 2018-11-29 NOTE — Progress Notes (Signed)
Pt meets inpatient criteria per Lindon Romp, NP. Referral information has been sent to the following hospitals for review:  [facility]     Disposition will continue to assist with pt's inpatient placement needs.   Audree Camel, LCSW, [place] Disposition [facility]/TTS [phone]

## 2018-11-30 NOTE — BHH Counselor (Signed)
TTS reassessment: Patient is alert and oriented x 3. She states she is in the hospital due to foot pain and needing a medication adjustment and now would like to be discharged. She states that her medications are fixed and she feels better. She denies SI/HI/AVH. Patient's thoughts are disorganized and her speech is tangential. She states she wants to go to [facility] as she is covered 80% in burns. She then states that she wants to be discharged. This clinician explained she is currently under an IVC and recommended for in patient treatment. This clinician asked her if there was any family we could speak to on her behalf if she could potentially be discharged. She states "No, I am my own family." Patient then states she does not want to be discharged and would like to go to [facility]. This clinician explained to patient that we refer to a number of hospitals but that we could not guarantee where she would be placed. TTS will continue to seek in patient placement.

## 2018-11-30 NOTE — ED Notes (Signed)
Pt given nightly snack

## 2018-11-30 NOTE — ED Notes (Addendum)
Assisted ambulation pt to restroom, pt's unsteady gait notified pt that this NT needs to stay door side incase pt has difficulty while using restroom, notified pt door will be closed and this NT will be outside the door. Pt acknowledge.

## 2018-11-30 NOTE — ED Notes (Signed)
Pt ambulating to and from bathroom with 1 assist. Went to bathroom several times tonight

## 2018-11-30 NOTE — ED Provider Notes (Signed)
Emergency Medicine Observation Re-evaluation Note  Megan Miranda is a 58 y.o. female, seen on rounds today.  Pt initially presented to the ED for complaints of Hallucinations Currently, the patient is sleeping, rouses to verbal stimuli. States her right foot is feeling better today. States she gets ativan currently but would like to go back to Depakote which she was prescribed previuosly by a bipolar specialist in [place].  Physical Exam  BP 113/64 (BP Location: Left Arm)   Pulse 65   Temp 97.8 F (36.6 C) (Axillary)   Resp 18   SpO2 100%  Physical Exam Alert. DP pulses present, swelling of the right foot with valgus deformity to both great MTPs. No erythema. Appears to be responding to internal stimuli.  ED Course / MDM  EKG: Clinical Course as of Nov 30 955  Mon Nov 28, 2018  2245 Sed Rate: 12 [AH]    Clinical Course User Index [AH] Margarita Mail, PA-C   I have reviewed the labs performed to date as well as medications administered while in observation.  Recent changes in the last 24 hours include able to shower with assistance from shower chair, has not needed further Ativan today. Plan  Current plan is for inpatient treatment, awaiting placement. Patient is under full IVC at this time.   Tacy Learn, PA-C 11/30/18 0957    Carmin Muskrat, MD 11/30/18 [phone]

## 2018-12-01 MED ORDER — LORAZEPAM 1 MG PO TABS
1.0000 mg | ORAL_TABLET | ORAL | Status: DC | PRN
  Administered 2018-12-01 – 2018-12-06 (×12): 1 mg via ORAL
  Filled 2018-12-01 (×12): qty 1

## 2018-12-01 NOTE — ED Notes (Signed)
Breakfast ordered 

## 2018-12-01 NOTE — ED Notes (Signed)
Patient demanded to leave and states her foot is all healed up; Pt has flight of ideas and disorganized speech-Monique,RN

## 2018-12-01 NOTE — ED Notes (Signed)
Pt is requesting her clothes-- wants to go home wants to talk with social worker. Took meds without issue, sitter at bedside

## 2018-12-01 NOTE — Progress Notes (Signed)
Pt. Continues to meet criteria for inpatient treatment per Mordecai Maes, NP.  No appropriate beds available at [facility]. Referred out to the following hospitals:  [facility] Details [facility] Details [facility] Details [facility] Details [facility] Details [facility] Details [facility] Details [facility] Details [place] Details [place] Details  Disposition CSW will continue to follow for placement.  Areatha Keas. Judi Cong, MSW, [place] Disposition Clinical Social Work [phone] (cell) [phone] (office)

## 2018-12-01 NOTE — ED Notes (Signed)
[name] -- pt's brother -- healthcare POA/is currently petitioning court for guardianship-- given update on pt. He would like to be notified by social work or provider with any updates.  [phone]

## 2018-12-02 ENCOUNTER — Other Ambulatory Visit: Payer: Self-pay

## 2018-12-02 MED ORDER — LORAZEPAM 1 MG PO TABS
0.0000 mg | ORAL_TABLET | Freq: Two times a day (BID) | ORAL | Status: AC
  Administered 2018-12-04: 15:00:00 2 mg via ORAL
  Filled 2018-12-02: qty 2

## 2018-12-02 MED ORDER — LORAZEPAM 2 MG/ML IJ SOLN
0.0000 mg | Freq: Two times a day (BID) | INTRAMUSCULAR | Status: AC
  Administered 2018-12-02: 23:00:00 2 mg via INTRAMUSCULAR

## 2018-12-02 MED ORDER — OLANZAPINE 5 MG PO TABS
5.0000 mg | ORAL_TABLET | Freq: Every day | ORAL | Status: DC
  Administered 2018-12-03 – 2018-12-05 (×3): 5 mg via ORAL
  Filled 2018-12-02 (×4): qty 1

## 2018-12-02 NOTE — Progress Notes (Signed)
Med recommendations per ED staff: Patient chart reviewed and lab values reassessed. Patient was started on home medications upon ED admission, at this time she continues to be dis-oriented, tangential, labile and pacing. Her lithium level upon admission was <0.06, will repeat level at this time with plans to increase lithium if needed. Patient with CKD 3, and increased risk for nephrotoxicity will monitor carefully, and obtain additional labs as needed. There is no recent TSH level, will order this time. Will discontinue prolixin and start zyprexa 5mg  po qhs. Due to increase risk of encephalopathy with atypicals and lithium will need to monitor carefully, and additional cbc with diff and bmp have been ordered. Sed rate on admission was 12. May continue with prn ativan for acute agitation and anxiety as needed. Will reassess tomorrow for improvement.

## 2018-12-02 NOTE — ED Provider Notes (Signed)
Emergency Medicine Observation Re-evaluation Note  MIOSHA BEHE is a 58 y.o. female, seen on rounds today.  Pt initially presented to the ED for complaints of Hallucinations Currently, the patient is resting comfortably in bed in no distress.  She denies any complaints or concerns today.  Physical Exam  BP (!) 113/58 (BP Location: Left Arm)   Pulse 74   Temp 98 F (36.7 C) (Oral)   Resp 18   SpO2 100%  Physical Exam Vitals signs and nursing note reviewed.  Constitutional:      General: She is not in acute distress. Neck:     Musculoskeletal: Normal range of motion.  Pulmonary:     Effort: Pulmonary effort is normal.  Skin:    General: Skin is warm.  Neurological:     Mental Status: She is alert.     ED Course / MDM  EKG:  Clinical Course as of Dec 01 833  Mon Nov 28, 2018  2245 Sed Rate: 12 [AH]    Clinical Course User Index [AH] Margarita Mail, PA-C   I have reviewed the labs performed to date as well as medications administered while in observation.  Recent changes in the last 24 hours include patient has requested to leave multiple times, however appears redirectable according to nursing notes.  According to nursing notes she meets inpatient treatment and they are attempting placement.. Plan  Current plan is for inpatient treatment. Patient is under full IVC at this time.   Lorin Glass, PA-C 12/02/18 2831    Isla Pence, MD 12/02/18 1002
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
## 2018-12-02 NOTE — ED Notes (Signed)
phelbotomy to draw pt's labs

## 2018-12-02 NOTE — ED Notes (Signed)
Exhibiting increasing pacing behaviors ativan given with much encouragement.

## 2018-12-02 NOTE — ED Notes (Signed)
Pt irritable and wanting to leave. States "I have people waiting for me outside" "I can't stay in here all night". Informed pt that there is no one out side waiting at this time.

## 2018-12-02 NOTE — BHH Counselor (Signed)
Re-assessment:   Patient re-assessed, patient disoriented and speaking in tangential language. Patient was alert but due to disoriented thinking was not oriented 3x. Patient did not answer questions asked of are. Patient rambled during the re-assessment then walked out the room.   Priscille Loveless, NP, continues to recommend inpatient treatment.

## 2018-12-03 DIAGNOSIS — F250 Schizoaffective disorder, bipolar type: Secondary | ICD-10-CM | POA: Diagnosis present

## 2018-12-03 LAB — BASIC METABOLIC PANEL
Anion gap: 8 (ref 5–15)
Anion gap: 9 (ref 5–15)
BUN: 33 mg/dL — ABNORMAL HIGH (ref 6–20)
BUN: 29 mg/dL — ABNORMAL HIGH (ref 6–20)
CO2: 20 mmol/L — ABNORMAL LOW (ref 22–32)
CO2: 22 mmol/L (ref 22–32)
Calcium: 9.8 mg/dL (ref 8.9–10.3)
Calcium: 9.9 mg/dL (ref 8.9–10.3)
Chloride: 111 mmol/L (ref 98–111)
Chloride: 112 mmol/L — ABNORMAL HIGH (ref 98–111)
Creatinine, Ser: 1.52 mg/dL — ABNORMAL HIGH (ref 0.44–1.00)
Creatinine, Ser: 1.55 mg/dL — ABNORMAL HIGH (ref 0.44–1.00)
GFR calc Af Amer: 43 mL/min — ABNORMAL LOW (ref >60)
GFR calc Af Amer: 42 mL/min — ABNORMAL LOW (ref >60)
GFR calc non Af Amer: 37 mL/min — ABNORMAL LOW (ref >60)
GFR calc non Af Amer: 36 mL/min — ABNORMAL LOW (ref >60)
Glucose, Bld: 90 mg/dL (ref 70–99)
Glucose, Bld: 100 mg/dL — ABNORMAL HIGH (ref 70–99)
Potassium: 4.2 mmol/L (ref 3.5–5.1)
Potassium: 4.1 mmol/L (ref 3.5–5.1)
Sodium: 139 mmol/L (ref 135–145)
Sodium: 143 mmol/L (ref 135–145)

## 2018-12-03 LAB — CBC WITH DIFFERENTIAL/PLATELET
Abs Immature Granulocytes: 0.01 10*3/uL (ref 0.00–0.07)
Basophils Absolute: 0.1 10*3/uL (ref 0.0–0.1)
Basophils Relative: 1 %
Eosinophils Absolute: 0.4 10*3/uL (ref 0.0–0.5)
Eosinophils Relative: 6 %
HCT: 38.2 % (ref 36.0–46.0)
Hemoglobin: 12.4 g/dL (ref 12.0–15.0)
Immature Granulocytes: 0 %
Lymphocytes Relative: 44 %
Lymphs Abs: 2.9 10*3/uL (ref 0.7–4.0)
MCH: 30.9 pg (ref 26.0–34.0)
MCHC: 32.5 g/dL (ref 30.0–36.0)
MCV: 95.3 fL (ref 80.0–100.0)
Monocytes Absolute: 0.5 10*3/uL (ref 0.1–1.0)
Monocytes Relative: 7 %
Neutro Abs: 2.8 10*3/uL (ref 1.7–7.7)
Neutrophils Relative %: 42 %
Platelets: 199 10*3/uL (ref 150–400)
RBC: 4.01 MIL/uL (ref 3.87–5.11)
RDW: 14.5 % (ref 11.5–15.5)
WBC: 6.6 10*3/uL (ref 4.0–10.5)
nRBC: 0 % (ref 0.0–0.2)

## 2018-12-03 LAB — URINALYSIS, ROUTINE W REFLEX MICROSCOPIC
Bilirubin Urine: NEGATIVE
Glucose, UA: NEGATIVE mg/dL
Hgb urine dipstick: NEGATIVE
Ketones, ur: NEGATIVE mg/dL
Leukocytes,Ua: NEGATIVE
Nitrite: NEGATIVE
Protein, ur: NEGATIVE mg/dL
Specific Gravity, Urine: 1.003 — ABNORMAL LOW (ref 1.005–1.030)
pH: 6 (ref 5.0–8.0)

## 2018-12-03 LAB — TSH: TSH: 1.489 u[IU]/mL (ref 0.350–4.500)

## 2018-12-03 LAB — LITHIUM LEVEL: Lithium Lvl: 0.39 mmol/L — ABNORMAL LOW (ref 0.60–1.20)

## 2018-12-03 LAB — CK: Total CK: 116 U/L (ref 38–234)

## 2018-12-03 MED ORDER — OLANZAPINE 5 MG PO TBDP
5.0000 mg | ORAL_TABLET | Freq: Once | ORAL | Status: AC
  Administered 2018-12-03: 5 mg via ORAL
  Filled 2018-12-03: qty 1

## 2018-12-03 NOTE — ED Notes (Signed)
Refused chest X-ray

## 2018-12-03 NOTE — ED Notes (Signed)
Breakfast tray ordered 
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
## 2018-12-03 NOTE — Consult Note (Signed)
Telepsych Consultation   Reason for Consult:  Hallucinations and psychosis Referring Physician:  Dr. Phylliss Bob Location of Patient: [facility] ED Location of Provider: [place] Department  Patient Identification: Megan Miranda MRN:  [id number] Principal Diagnosis: Schizoaffective disorder, manic type ([place]) Diagnosis:  Principal Problem:   Schizoaffective disorder, manic type ([place]) Active Problems:   Bipolar I disorder ([place])   Total Time spent with patient: 30 minutes  Subjective:   Megan Miranda is a 58 y.o. female patient admitted with hallucinations and aggression.   During the evaluation: Patient is alert and oriented x1, she continues to present with disorganized thinking, very tangential, pressured speech and delusional. During the valuation she was citing the consitutional and statures, stated her attorney was ron Scientist, forensic general. When discussing treatment plan with patient she reported that president bush had bruised her arm, and took every drop of blood out of her body. She also was observed to have some word salad and echolalia. She denies any suicidal thoughts, homicidal thoughts and or hallucinations at this time. She continues to be a harm to herself due to mania, increased delusions,grandiosity, and illusions. She remains calm and pleasant yet not easily redirectable.  HPI:  Megan Miranda is a 58 y.o. female who was brought to [facility] via the police under an IVC that was completed by pt's brother. Pt's brother shares he completed the IVC due to pt calling 911 on Sunday morning due to belief that someone was attacking her; when the police arrived, she refused to let them into her home. Pt's brother states his sister had not been allowing him into her home for the past two months; when he went into her home yesterday, he found her feces and urine covering the home, her walls written over, the floor buckled from the urine, and lampshades written all over. Pt's brother  states pt had taken apart all of her picture frames and that he found pt's medication dumped into a drawer, causing him to believe that pt has not taken her medication in over a year.  Pt states she engages in consuming approximately 1 beer per week. Pt's brother states pt drinks between 6-12 beers per day. Pt denies SI, any prior attempts to kill herself, and states she has not been hospitalized in 4-5 years. Pt denies HI, AVH, NSSIB, acess to guns/weapons, or engagement in the legal system.    Past Psychiatric History: schizoaffective, bipolar disorder  Risk to Self: Suicidal Ideation: No Suicidal Intent: No Is patient at risk for suicide?: No Suicidal Plan?: No Access to Means: No What has been your use of drugs/alcohol within the last 12 months?: Pt acknowledges weekly EtOH use; brother states daily use How many times?: 0 Other Self Harm Risks: Pt has not been taking her medication Triggers for Past Attempts: None known Intentional Self Injurious Behavior: None Risk to Others: Homicidal Ideation: No Thoughts of Harm to Others: No Current Homicidal Intent: No Current Homicidal Plan: No Access to Homicidal Means: No Identified Victim: None noted History of harm to others?: No Assessment of Violence: On admission Violent Behavior Description: None noted Does patient have access to weapons?: No(Pt denies access to guns/weapons) Criminal Charges Pending?: No Does patient have a court date: No Prior Inpatient Therapy: Prior Inpatient Therapy: Yes Prior Therapy Dates: 4/5 years ago Prior Therapy Facilty/Provider(s): Unknown Reason for Treatment: Schizo-affective bipolar Prior Outpatient Therapy: Prior Outpatient Therapy: No Does patient have an ACCT team?: No Does patient have Intensive In-House Services?  :  No Does patient have [facility] services? : No Does patient have P4CC services?: No  Past Medical History:  Past Medical History:  Diagnosis Date  . Bipolar disorder ([place])     History reviewed. No pertinent surgical history. Family History: History reviewed. No pertinent family history. Family Psychiatric  History: unknown Social History:  Social History   Substance and Sexual Activity  Alcohol Use No     Social History   Substance and Sexual Activity  Drug Use No    Social History   Socioeconomic History  . Marital status: Married    Spouse name: Not on file  . Number of children: Not on file  . Years of education: Not on file  . Highest education level: Not on file  Occupational History  . Not on file  Social Needs  . Financial resource strain: Not on file  . Food insecurity    Worry: Not on file    Inability: Not on file  . Transportation needs    Medical: Not on file    Non-medical: Not on file  Tobacco Use  . Smoking status: Current Some Day Smoker    Packs/day: 0.10    Years: 2.00    Pack years: 0.20  . Smokeless tobacco: Never Used  . Tobacco comment: Smokes a few cigarettes a week.   Substance and Sexual Activity  . Alcohol use: No  . Drug use: No  . Sexual activity: Never  Lifestyle  . Physical activity    Days per week: Not on file    Minutes per session: Not on file  . Stress: Not on file  Relationships  . Social Herbalist on phone: Not on file    Gets together: Not on file    Attends religious service: Not on file    Active member of club or organization: Not on file    Attends meetings of clubs or organizations: Not on file    Relationship status: Not on file  Other Topics Concern  . Not on file  Social History Narrative  . Not on file   Additional Social History:    Allergies:   Allergies  Allergen Reactions  . Bee Venom Itching  . Soap Rash    Labs:  Results for orders placed or performed during the hospital encounter of 11/28/18 (from the past 48 hour(s))  Lithium level     Status: Abnormal   Collection Time: 12/03/18 12:25 AM  Result Value Ref Range   Lithium Lvl 0.39 (L) 0.60 - 1.20  mmol/L    Comment: Performed at [facility] [address], [place], [place] [address]  CBC with Differential/Platelet     Status: None   Collection Time: 12/03/18 12:25 AM  Result Value Ref Range   WBC 6.6 4.0 - 10.5 K/uL   RBC 4.01 3.87 - 5.11 MIL/uL   Hemoglobin 12.4 12.0 - 15.0 g/dL   HCT 38.2 36.0 - 46.0 %   MCV 95.3 80.0 - 100.0 fL   MCH 30.9 26.0 - 34.0 pg   MCHC 32.5 30.0 - 36.0 g/dL   RDW 14.5 11.5 - 15.5 %   Platelets 199 150 - 400 K/uL   nRBC 0.0 0.0 - 0.2 %   Neutrophils Relative % 42 %   Neutro Abs 2.8 1.7 - 7.7 K/uL   Lymphocytes Relative 44 %   Lymphs Abs 2.9 0.7 - 4.0 K/uL   Monocytes Relative 7 %   Monocytes Absolute 0.5  0.1 - 1.0 K/uL   Eosinophils Relative 6 %   Eosinophils Absolute 0.4 0.0 - 0.5 K/uL   Basophils Relative 1 %   Basophils Absolute 0.1 0.0 - 0.1 K/uL   Immature Granulocytes 0 %   Abs Immature Granulocytes 0.01 0.00 - 0.07 K/uL    Comment: Performed at [facility] [address], [place], [place] [address]  Basic metabolic panel     Status: Abnormal   Collection Time: 12/03/18 12:25 AM  Result Value Ref Range   Sodium 139 135 - 145 mmol/L   Potassium 4.2 3.5 - 5.1 mmol/L   Chloride 111 98 - 111 mmol/L   CO2 20 (L) 22 - 32 mmol/L   Glucose, Bld 90 70 - 99 mg/dL   BUN 33 (H) 6 - 20 mg/dL   Creatinine, Ser 1.52 (H) 0.44 - 1.00 mg/dL   Calcium 9.8 8.9 - 10.3 mg/dL   GFR calc non Af Amer 37 (L) >60 mL/min   GFR calc Af Amer 43 (L) >60 mL/min   Anion gap 8 5 - 15    Comment: Performed at [facility] [address] [place], [place] [address]  TSH     Status: None   Collection Time: 12/03/18 12:25 AM  Result Value Ref Range   TSH 1.489 0.350 - 4.500 uIU/mL    Comment: Performed by a 3rd Generation assay with a functional sensitivity of <=0.01 uIU/mL. Performed at [facility], [place] [address], [place], [place] [address]   CK     Status: None   Collection Time: 12/03/18 12:25 AM  Result Value Ref Range   Total  CK 116 38 - 234 U/L    Comment: Performed at [facility], [place] [address] [place], [place] [address]    Medications:  Current Facility-Administered Medications  Medication Dose Route Frequency Provider Last Rate Last Dose  . acetaminophen (TYLENOL) tablet 500 mg  500 mg Oral Q6H PRN Margarita Mail, PA-C      . alum & mag hydroxide-simeth (MAALOX/MYLANTA) 200-200-20 MG/5ML suspension 30 mL  30 mL Oral Q6H PRN Harris, Abigail, PA-C      . doxycycline (VIBRA-TABS) tablet 100 mg  100 mg Oral Q12H Harris, Abigail, PA-C   100 mg at 12/02/18 1024  . lamoTRIgine (LAMICTAL) tablet 200 mg  200 mg Oral Daily Harris, Abigail, PA-C   200 mg at 12/02/18 1025  . lithium carbonate capsule 150 mg  150 mg Oral BID WC Harris, Abigail, PA-C   150 mg at 12/03/18 0804  . LORazepam (ATIVAN) injection 0-4 mg  0-4 mg Intramuscular Q12H Rancour, Annie Main, MD   2 mg at 12/02/18 2245   Or  . LORazepam (ATIVAN) tablet 0-4 mg  0-4 mg Oral Q12H Rancour, Stephen, MD      . LORazepam (ATIVAN) tablet 1 mg  1 mg Oral Q4H PRN Lacretia Leigh, MD   1 mg at 12/02/18 1431  . OLANZapine (ZYPREXA) tablet 5 mg  5 mg Oral QHS Starkes-Perry, Gayland Curry, FNP      . OLANZapine zydis (ZYPREXA) disintegrating tablet 5 mg  5 mg Oral Once Suella Broad, FNP      . ondansetron [facility]) tablet 4 mg  4 mg Oral Q8H PRN Harris, Abigail, PA-C      . thiamine (VITAMIN B-1) tablet 100 mg  100 mg Oral Daily Petrucelli, Samantha R, PA-C   100 mg at 12/02/18 1026   Or  . thiamine (B-1)  injection 100 mg  100 mg Intravenous Daily Petrucelli, Samantha R, PA-C       Current Outpatient Medications  Medication Sig Dispense Refill  . fluPHENAZine (PROLIXIN) 1 MG tablet Take 1 tablet (1 mg total) by mouth 2 (two) times daily. (Patient not taking: Reported on 11/29/2018) 60 tablet 1  . lamoTRIgine (LAMICTAL) 200 MG tablet Take 1 tablet (200 mg total) by mouth daily. (Patient not taking: Reported on 11/29/2018) 30 tablet 3  . lithium carbonate 150 MG  capsule Take 1 capsule (150 mg total) by mouth 2 (two) times daily with a meal. (Patient not taking: Reported on 11/29/2018) 60 capsule 1    Musculoskeletal: Strength & Muscle Tone: within normal limits Gait & Station: normal Patient leans: N/A  Psychiatric Specialty Exam: Physical Exam  ROS  Blood pressure (!) 132/35, pulse 89, temperature 98.1 F (36.7 C), temperature source Oral, resp. rate 20, SpO2 100 %.There is no height or weight on file to calculate BMI.  General Appearance: Disheveled  Eye Contact:  Fair  Speech:  Pressured  Volume:  Increased  Mood:  Irritable  Affect:  Labile and Full Range  Thought Process:  Disorganized and Descriptions of Associations: Circumstantial  Orientation:  Full (Time, Place, and Person)  Thought Content:  Logical  Suicidal Thoughts:  No  Homicidal Thoughts:  No  Memory:  Immediate;   Fair Recent;   Fair  Judgement:  Impaired  Insight:  Lacking  Psychomotor Activity:  Normal  Concentration:  Attention Span: Poor  Recall:  Poor  Fund of Knowledge:  Poor  Language:  Fair  Akathisia:  No  Handed:  Right  AIMS (if indicated):     Assets:  Communication Skills Desire for Improvement Housing Physical Health Social Support  ADL's:  Intact  Cognition:  WNL  Sleep:        Treatment Plan Summary: Daily contact with patient to assess and evaluate symptoms and progress in treatment and Medication management  Disposition: Recommend psychiatric Inpatient admission when medically cleared. Will continue with previous medicine recommendations at this time. Lithium level obtained yesterday is 0.39 although not therapeutic patient with history of lithium toxicity, mod-sev kidney disease, hx of dehydration, will not adjust lithium level at this time unless further warranted. She declined her zyprexa last night, will switch to zydis 5mg  po BID at this time. As per nurse patient is drinking a lot of PO fluids, and therefore dehydration is not an area  of concern. Will repeat additional labs tomorrow if necessary. Will continue to seek geropsych at this time.   This service was provided via telemedicine using a 2-way, interactive audio and video technology.  Names of all persons participating in this telemedicine service and their role in this encounter. Name: Priscille Loveless perry Role: FNP-BC             Suella Broad, FNP 12/03/2018 10:52 AM

## 2018-12-03 NOTE — ED Notes (Signed)
Went in to attempt to draw pt's blood, pt now sleeping/laying in bed with eyes closed ignoring this RN

## 2018-12-03 NOTE — Progress Notes (Signed)
CSW contacted referral facilities with the following results:   Still reviewing: Megan Miranda (resent per request) [facility] (resent) [facility] (resent) Megan Miranda (resent) [facility] (resent)  Declined: [facility] (at capacity) [facility] (at capacity) [facility] (at capacity) [facility] (due to medical)  TTS will continue to seek bed placement.   Megan Miranda. Megan Miranda, MSW, [place] Work/Disposition Phone: [phone] Fax: [phone]

## 2018-12-03 NOTE — ED Notes (Signed)
Pt had brief episode of aggression wherein she attempted to strike this RN for offering her blankets and not one of her dresses. Angry when informed of personal belongings policy. Instructed to display appropriate behavior, pt went back to sleep

## 2018-12-03 NOTE — ED Notes (Signed)
Pt continues to refuse chest x-ray  Saying she doesn't feel up to it will reattempt later

## 2018-12-03 NOTE — ED Notes (Signed)
Pt had labs due to be drawn at 1830 and pt is still refusing.

## 2018-12-03 NOTE — ED Notes (Signed)
Security and [employer] to room. Pt still fighting and yelling. Held down for Ativan injection. Will continue to monitor.

## 2018-12-04 NOTE — ED Notes (Signed)
Called [employer] to have IVC papers served.

## 2018-12-04 NOTE — ED Notes (Signed)
Called magistrate to ensure they received IVC paperwork. Received findings and custody on our fax machine.

## 2018-12-04 NOTE — BH Assessment (Signed)
[facility] Assessment Progress Note   Patient was seen for reassessment.  She continues to be disorganized.  She states, "I have my MRS degree."  When asked if she slept well, she states, "yes."  Patient then states, you heard my song this morning, "just let me be."  Patient then closes her eyes and does not answer anymore questions.  TTS did not pursue any questions for fear of agitating patient.  Continued Inpatient is recommended.

## 2018-12-04 NOTE — ED Notes (Signed)
[employer] officers here to serve IVC paperwork.

## 2018-12-04 NOTE — ED Notes (Signed)
Lunch tray ordered 

## 2018-12-04 NOTE — ED Provider Notes (Signed)
Emergency Medicine Observation Re-evaluation Note  Megan Miranda is a 58 y.o. female, seen on daily rounds today.  Patient presented to the ED under IVC for hallucinations w/ concern for danger to self. Driniks 6-12 beers per day.   On evaluation she is in no acute distress, she is eating her breakfast.   Physical Exam  BP 124/70 (BP Location: Right Arm)   Pulse 60   Temp 98.4 F (36.9 C) (Axillary)   Resp 16   SpO2 100%  Physical Exam Vitals signs and nursing note reviewed.  Constitutional:      General: She is not in acute distress.    Appearance: She is non toxic.  HENT:     Head: Normocephalic and atraumatic.  Eyes:     Conjunctiva/sclera: Conjunctivae normal.  Neck:     Musculoskeletal: Neck supple.  Cardiovascular:     Rate and Rhythm: Normal rate Pulmonary:     Effort: Pulmonary effort is normal.  Skin:    General: Skin is warm and dry.  Neurological:     Mental Status: She is alert.  Psychiatric:        Behavior: Behavior normal.     ED Course / MDM   Prior labs & imaging reviewed.   Plan  Continue doxycycline for RLE cellulitis.  Plan is for inpatient psychiatric admission. Patient is under full IVC at this time.         Vitals:   11/28/18 1630 11/28/18 1802 11/29/18 0010 11/29/18 0542  BP: 115/66 122/70 (!) 110/58 124/70  Pulse: 76 82 60 60  Resp:   16 16  Temp:   97.8 F (36.6 C) 98.4 F (36.9 C)  TempSrc:   Oral Axillary  SpO2:   100% 100%      Megan Dyke, PA-C 12/04/18 3403    Megan Manifold, MD 12/04/18 [phone]

## 2018-12-04 NOTE — ED Notes (Signed)
New IVC papers faxed to magistrate.

## 2018-12-04 NOTE — ED Notes (Signed)
Breakfast tray ordered 

## 2018-12-04 NOTE — ED Notes (Signed)
Spoke with EDP about renewal of IVC papers.

## 2018-12-04 NOTE — ED Notes (Signed)
New IVC paperwork faxed to [facility]. Copies made and placed on clipboard. Copy made and sent to medical records.

## 2018-12-04 NOTE — ED Notes (Signed)
Patient denies pain and is resting comfortably.  

## 2018-12-04 NOTE — ED Notes (Signed)
X-ray here to take pt. Pt being uncooperative and will not get out of bed. Pt states "Just because you want me to have lung cancer doesn't mean I will and I will not go to xray for that reason". Explained to pt that it was important to do this test that the md ordered. Pt states "I don't care about that test".

## 2018-12-05 DIAGNOSIS — F250 Schizoaffective disorder, bipolar type: Secondary | ICD-10-CM

## 2018-12-05 MED ORDER — LORAZEPAM 2 MG/ML IJ SOLN: 0.0000 mg | Freq: Two times a day (BID) | INTRAMUSCULAR | Status: DC

## 2018-12-05 MED ORDER — LORAZEPAM 1 MG PO TABS: 0.0000 mg | ORAL_TABLET | Freq: Two times a day (BID) | ORAL | Status: DC

## 2018-12-05 NOTE — ED Notes (Signed)
Pt abruptly woke up and asked where she was. Pt informed that she was at [facility]. Pt stated, "Al Simeon Craft is deceit". Pt continuing to talk to self. This Tech is unable to verbally comprehend most of pt's conversation. Informed Emmy - RN.

## 2018-12-05 NOTE — ED Provider Notes (Signed)
Daily rounding.  Please see previous provider for full H&P.  In short,  Megan Miranda is a 58 y.o. female with a PMH of Bipolar Disorder presents for hallucinations and concerns of danger to self. Patient is IVC'd. No acute events overnight.   Physical Exam  BP (!) 100/53 (BP Location: Left Arm)   Pulse 70   Temp 98 F (36.7 C) (Oral)   Resp 15   SpO2 100%   Physical Exam Vitals signs and nursing note reviewed.  Constitutional:      General: She is not in acute distress.    Appearance: Normal appearance. She is well-developed.     Comments: Patient is sleeping in no acute distress.  HENT:     Head: Normocephalic and atraumatic.  Pulmonary:     Effort: Pulmonary effort is normal.    ED Course/Procedures   Clinical Course as of Dec 05 1210  Mon Nov 28, 2018  2245 Sed Rate: 12 [AH]    Clinical Course User Index [AH] Margarita Mail, PA-C    Procedures  MDM  Current plan is for inpatient psychiatric admission. Pending placement.       Darlin Drop [place], [place] 12/05/18 1216    Isla Pence, MD 12/05/18 1242

## 2018-12-05 NOTE — Progress Notes (Addendum)
Pt accepted to [facility] 12/06/18 Jonelle Sports, MD is the accepting/attending provider.  Call report to [phone] [facility] ED notified.   Pt is IVC   Pt may be transported by [facility] Pt scheduled  to arrive at [facility] 10 am ON 12/06/18  Areatha Keas. Judi Cong, MSW, [place] Disposition Clinical Social Work [phone] (cell) [phone] (office)  Interim Legal Guardian, Lynann Bologna (brother) notified IVC and Guardianship paperwork faxed to [facility]

## 2018-12-05 NOTE — Consult Note (Signed)
Telepsych Consultation   Reason for Consult:  MANIC/BIZARRE BEHAVIORS  Referring Physician:  EDP  Location of Patient:  [facility] ED [phone] Location of Provider: [facility]  Patient Identification: Megan Miranda MRN:  [id number] Principal Diagnosis: Schizoaffective disorder, manic type ([place]) Diagnosis:  Principal Problem:   Schizoaffective disorder, manic type ([place]) Active Problems:   Bipolar I disorder ([place])   Total Time spent with patient: 15 minutes  Subjective:   Megan Miranda is a 58 y.o. female patient was seen via tele assessment. During this assessment patient presents with disorganized and tangential. She rambles about things not associated with the question. Her speech is pressured. She appears disheveled. She does have a history of Bipolar and schizophrenia although she denies  hearing voices or seeing things. She however, does appear manic. She reports living alone and as noted below, when she was found, feces and urine covering the home, her walls written over, the floor buckled from the urine, and lampshades written all over. She is on lithium and Haldol per her report. Per review of chart, patient has a history of non-compliance with mental health treatment and she is at a point where she is unable to care for herself. She denies active or passive suicidal ideations or homicidal thoughts. Based off this assessment, patient continues to meet inpatient psychiatric hospitalization.    HPI: Per assessment note:  Megan Miranda is a 58 y.o. female who was brought to [facility] via the police under an IVC that was completed by pt's brother. Pt's brother shares he completed the IVC due to pt calling 911 on Sunday morning due to belief that someone was attacking her; when the police arrived, she refused to let them into her home. Pt's brother states his sister had not been allowing him into her home for the past two months; when he went into her home yesterday, he found her feces and  urine covering the home, her walls written over, the floor buckled from the urine, and lampshades written all over. Pt's brother states pt had taken apart all of her picture frames and that he found pt's medication dumped into a drawer, causing him to believe that pt has not taken her medication in over a year   Past Psychiatric History: BIPOLAR, SCHIZOPHRENIA   Risk to Self: Suicidal Ideation: No Suicidal Intent: No Is patient at risk for suicide?: No Suicidal Plan?: No Access to Means: No What has been your use of drugs/alcohol within the last 12 months?: Pt acknowledges weekly EtOH use; brother states daily use How many times?: 0 Other Self Harm Risks: Pt has not been taking her medication Triggers for Past Attempts: None known Intentional Self Injurious Behavior: None Risk to Others: Homicidal Ideation: No Thoughts of Harm to Others: No Current Homicidal Intent: No Current Homicidal Plan: No Access to Homicidal Means: No Identified Victim: None noted History of harm to others?: No Assessment of Violence: On admission Violent Behavior Description: None noted Does patient have access to weapons?: No(Pt denies access to guns/weapons) Criminal Charges Pending?: No Does patient have a court date: No Prior Inpatient Therapy: Prior Inpatient Therapy: Yes Prior Therapy Dates: 4/5 years ago Prior Therapy Facilty/Provider(s): Unknown Reason for Treatment: Schizo-affective bipolar Prior Outpatient Therapy: Prior Outpatient Therapy: No Does patient have an ACCT team?: No Does patient have Intensive In-House Services?  : No Does patient have [facility] services? : No Does patient have P4CC services?: No  Past Medical History:  Past Medical History:  Diagnosis Date  .  Bipolar disorder ([place])    History reviewed. No pertinent surgical history. Family History: History reviewed. No pertinent family history. Family Psychiatric  History: none noted in chart  Social History:  Social History    Substance and Sexual Activity  Alcohol Use No     Social History   Substance and Sexual Activity  Drug Use No    Social History   Socioeconomic History  . Marital status: Married    Spouse name: Not on file  . Number of children: Not on file  . Years of education: Not on file  . Highest education level: Not on file  Occupational History  . Not on file  Social Needs  . Financial resource strain: Not on file  . Food insecurity    Worry: Not on file    Inability: Not on file  . Transportation needs    Medical: Not on file    Non-medical: Not on file  Tobacco Use  . Smoking status: Current Some Day Smoker    Packs/day: 0.10    Years: 2.00    Pack years: 0.20  . Smokeless tobacco: Never Used  . Tobacco comment: Smokes a few cigarettes a week.   Substance and Sexual Activity  . Alcohol use: No  . Drug use: No  . Sexual activity: Never  Lifestyle  . Physical activity    Days per week: Not on file    Minutes per session: Not on file  . Stress: Not on file  Relationships  . Social Herbalist on phone: Not on file    Gets together: Not on file    Attends religious service: Not on file    Active member of club or organization: Not on file    Attends meetings of clubs or organizations: Not on file    Relationship status: Not on file  Other Topics Concern  . Not on file  Social History Narrative  . Not on file   Additional Social History:    Allergies:   Allergies  Allergen Reactions  . Bee Venom Itching  . Soap Rash    Labs:  Results for orders placed or performed during the hospital encounter of 11/28/18 (from the past 48 hour(s))  Urinalysis, Routine w reflex microscopic     Status: Abnormal   Collection Time: 12/03/18 11:14 AM  Result Value Ref Range   Color, Urine COLORLESS (A) YELLOW   APPearance CLEAR CLEAR   Specific Gravity, Urine 1.003 (L) 1.005 - 1.030   pH 6.0 5.0 - 8.0   Glucose, UA NEGATIVE NEGATIVE mg/dL   Hgb urine dipstick  NEGATIVE NEGATIVE   Bilirubin Urine NEGATIVE NEGATIVE   Ketones, ur NEGATIVE NEGATIVE mg/dL   Protein, ur NEGATIVE NEGATIVE mg/dL   Nitrite NEGATIVE NEGATIVE   Leukocytes,Ua NEGATIVE NEGATIVE    Comment: Performed at [facility] [address], [place], [place] [address]  Basic metabolic panel     Status: Abnormal   Collection Time: 12/03/18  7:49 PM  Result Value Ref Range   Sodium 143 135 - 145 mmol/L   Potassium 4.1 3.5 - 5.1 mmol/L   Chloride 112 (H) 98 - 111 mmol/L   CO2 22 22 - 32 mmol/L   Glucose, Bld 100 (H) 70 - 99 mg/dL   BUN 29 (H) 6 - 20 mg/dL   Creatinine, Ser 1.55 (H) 0.44 - 1.00 mg/dL   Calcium 9.9 8.9 - 10.3 mg/dL   GFR calc non Af Amer 36 (  L) >60 mL/min   GFR calc Af Amer 42 (L) >60 mL/min   Anion gap 9 5 - 15    Comment: Performed at [facility] [address] [place], [place] [address]    Medications:  Current Facility-Administered Medications  Medication Dose Route Frequency Provider Last Rate Last Dose  . acetaminophen (TYLENOL) tablet 500 mg  500 mg Oral Q6H PRN Margarita Mail, PA-C      . alum & mag hydroxide-simeth (MAALOX/MYLANTA) 200-200-20 MG/5ML suspension 30 mL  30 mL Oral Q6H PRN Harris, Abigail, PA-C      . doxycycline (VIBRA-TABS) tablet 100 mg  100 mg Oral Q12H Harris, Abigail, PA-C   100 mg at 12/05/18 0914  . lamoTRIgine (LAMICTAL) tablet 200 mg  200 mg Oral Daily Margarita Mail, PA-C   200 mg at 12/05/18 0914  . lithium carbonate capsule 150 mg  150 mg Oral BID WC Harris, Abigail, PA-C   150 mg at 12/05/18 0732  . LORazepam (ATIVAN) injection 0-4 mg  0-4 mg Intramuscular Q12H Palumbo, April, MD   Stopped at 12/05/18 2458   Or  . LORazepam (ATIVAN) tablet 0-4 mg  0-4 mg Oral Q12H Palumbo, April, MD      . LORazepam (ATIVAN) tablet 1 mg  1 mg Oral Q4H PRN Lacretia Leigh, MD   1 mg at 12/05/18 0914  . OLANZapine (ZYPREXA) tablet 5 mg  5 mg Oral QHS Suella Broad, FNP   5 mg at 12/04/18 2208  . ondansetron (ZOFRAN)  tablet 4 mg  4 mg Oral Q8H PRN Harris, Abigail, PA-C      . thiamine (VITAMIN B-1) tablet 100 mg  100 mg Oral Daily Petrucelli, Samantha R, PA-C   100 mg at 12/05/18 0998   Or  . thiamine (B-1) injection 100 mg  100 mg Intravenous Daily Petrucelli, Samantha R, PA-C       Current Outpatient Medications  Medication Sig Dispense Refill  . fluPHENAZine (PROLIXIN) 1 MG tablet Take 1 tablet (1 mg total) by mouth 2 (two) times daily. (Patient not taking: Reported on 11/29/2018) 60 tablet 1  . lamoTRIgine (LAMICTAL) 200 MG tablet Take 1 tablet (200 mg total) by mouth daily. (Patient not taking: Reported on 11/29/2018) 30 tablet 3  . lithium carbonate 150 MG capsule Take 1 capsule (150 mg total) by mouth 2 (two) times daily with a meal. (Patient not taking: Reported on 11/29/2018) 60 capsule 1    Musculoskeletal: Unable to access as evaluation via telepsych   Psychiatric Specialty Exam: Physical Exam  Vitals reviewed. Constitutional: She appears well-developed.  Psychiatric: She has a normal mood and affect. Her behavior is normal.    ROS  Blood pressure 100/60, pulse 74, temperature (!) 97.5 F (36.4 C), temperature source Oral, resp. rate 15, SpO2 100 %.There is no height or weight on file to calculate BMI.  General Appearance: Disheveled  Eye Contact:  Fair  Speech:  Pressured  Volume:  Normal  Mood:  Anxious  Affect:  Congruent  Thought Process:  Disorganized and Descriptions of Associations: Tangential  Orientation:  Full (Time, Place, and Person)  Thought Content:  Tangential  Suicidal Thoughts:  No  Homicidal Thoughts:  No  Memory:  Immediate;   Fair Recent;   Fair  Judgement:  Impaired  Insight:  Lacking  Psychomotor Activity:  Normal  Concentration:  Concentration: Fair and Attention Span: Fair  Recall:  [employer] of Knowledge:  Fair  Language:  Good  Akathisia:  Negative  Handed:  Right  AIMS (if indicated):     Assets:  Communication Skills  ADL's:  Intact  Cognition:   WNL  Sleep:        Treatment Plan Summary: Daily contact with patient to assess and evaluate symptoms and progress in treatment  Disposition: .   Patient  meet criteria for psychiatric inpatient admission. Reccommended to continue current treatment plan. LCSW will continue to search for placement. Recommend Lithium level.      This service was provided via telemedicine using a 2-way, interactive audio and video technology.  Names of all persons participating in this telemedicine service and their role in this encounter. Name: Mordecai Maes  Role: FNP-C  Name: Madalyn Rob  Role: Patient    Derrill [place], NP 12/05/2018 9:42 AM

## 2018-12-05 NOTE — ED Notes (Signed)
Dinner tray ordered.

## 2018-12-05 NOTE — ED Notes (Signed)
Breakfast tray ordered 

## 2018-12-05 NOTE — ED Notes (Signed)
Breakfast tray arrived  

## 2018-12-05 NOTE — ED Notes (Signed)
Pt making phone call. 

## 2018-12-05 NOTE — ED Notes (Signed)
Lunch tray ordered 

## 2018-12-05 NOTE — Progress Notes (Signed)
Pt. Continues to meet criteria for inpatient treatment per Mordecai Maes NP.  No appropriate beds available at [facility]. Referred out to the following hospitals:  [facility] Details  [facility] Details  [facility] Details  [facility] Details  [facility] Details  [facility] Details  [facility] Details  [facility] Details  [facility] Details  [facility] Details   Disposition CSW will continue to follow for placement.  Areatha Keas. Judi Cong, MSW, [place] Disposition Clinical Social Work [phone] (cell) [phone] (office)

## 2018-12-05 NOTE — Progress Notes (Signed)
CSW received copy of Legal Guardianship papers.  Pt's Brother-James Christel Mormon [phone], has been appointed as an interim guardian.  Pt's renewed IVC paperwork also received.  Areatha Keas. Judi Cong, MSW, [place] Disposition Clinical Social Work [phone] (cell) [phone] (office)

## 2018-12-05 NOTE — ED Notes (Signed)
Dinner tray arrived 

## 2018-12-06 LAB — BASIC METABOLIC PANEL
Anion gap: 10 (ref 5–15)
BUN: 35 mg/dL — ABNORMAL HIGH (ref 6–20)
CO2: 18 mmol/L — ABNORMAL LOW (ref 22–32)
Calcium: 10.2 mg/dL (ref 8.9–10.3)
Chloride: 115 mmol/L — ABNORMAL HIGH (ref 98–111)
Creatinine, Ser: 1.44 mg/dL — ABNORMAL HIGH (ref 0.44–1.00)
GFR calc Af Amer: 46 mL/min — ABNORMAL LOW (ref >60)
GFR calc non Af Amer: 40 mL/min — ABNORMAL LOW (ref >60)
Glucose, Bld: 126 mg/dL — ABNORMAL HIGH (ref 70–99)
Potassium: 4.5 mmol/L (ref 3.5–5.1)
Sodium: 143 mmol/L (ref 135–145)

## 2018-12-06 NOTE — ED Provider Notes (Signed)
Pt evaluated this am.  Pt complains of being cold.  No other complaints. Vitals stable   Megan Miranda 12/06/18 0827    Virgel Manifold, MD 12/06/18 0830

## 2018-12-06 NOTE — ED Notes (Signed)
attempted to call pts brother Mr  Huel Cote to tell him pt is being moved called both No in chart

## 2018-12-06 NOTE — ED Notes (Signed)
Pt up to walk in hallway

## 2021-05-14 ENCOUNTER — Encounter (HOSPITAL_COMMUNITY): Payer: Self-pay | Admitting: Emergency Medicine

## 2021-05-14 ENCOUNTER — Other Ambulatory Visit: Payer: Self-pay

## 2021-05-14 ENCOUNTER — Inpatient Hospital Stay (HOSPITAL_COMMUNITY)
Admission: EM | Admit: 2021-05-14 | Discharge: 2021-06-06 | DRG: 683 | Disposition: A | Discharge status: Skilled Nursing Facility | Payer: BC Managed Care – PPO | Source: Skilled Nursing Facility | Attending: Internal Medicine | Admitting: Internal Medicine

## 2021-05-14 ENCOUNTER — Emergency Department (HOSPITAL_COMMUNITY): Payer: BC Managed Care – PPO

## 2021-05-14 DIAGNOSIS — F17210 Nicotine dependence, cigarettes, uncomplicated: Secondary | ICD-10-CM | POA: Diagnosis present

## 2021-05-14 DIAGNOSIS — R338 Other retention of urine: Secondary | ICD-10-CM

## 2021-05-14 DIAGNOSIS — I129 Hypertensive chronic kidney disease with stage 1 through stage 4 chronic kidney disease, or unspecified chronic kidney disease: Secondary | ICD-10-CM | POA: Diagnosis present

## 2021-05-14 DIAGNOSIS — F250 Schizoaffective disorder, bipolar type: Secondary | ICD-10-CM | POA: Diagnosis present

## 2021-05-14 DIAGNOSIS — E870 Hyperosmolality and hypernatremia: Secondary | ICD-10-CM | POA: Diagnosis not present

## 2021-05-14 DIAGNOSIS — N179 Acute kidney failure, unspecified: Secondary | ICD-10-CM | POA: Diagnosis present

## 2021-05-14 DIAGNOSIS — D6489 Other specified anemias: Secondary | ICD-10-CM | POA: Diagnosis present

## 2021-05-14 DIAGNOSIS — Z66 Do not resuscitate: Secondary | ICD-10-CM | POA: Diagnosis not present

## 2021-05-14 DIAGNOSIS — G319 Degenerative disease of nervous system, unspecified: Secondary | ICD-10-CM | POA: Diagnosis present

## 2021-05-14 DIAGNOSIS — N170 Acute kidney failure with tubular necrosis: Secondary | ICD-10-CM | POA: Diagnosis not present

## 2021-05-14 DIAGNOSIS — Z20822 Contact with and (suspected) exposure to covid-19: Secondary | ICD-10-CM | POA: Diagnosis present

## 2021-05-14 DIAGNOSIS — Z7189 Other specified counseling: Secondary | ICD-10-CM

## 2021-05-14 DIAGNOSIS — E860 Dehydration: Secondary | ICD-10-CM | POA: Diagnosis present

## 2021-05-14 DIAGNOSIS — Z6820 Body mass index (BMI) 20.0-20.9, adult: Secondary | ICD-10-CM

## 2021-05-14 DIAGNOSIS — N189 Chronic kidney disease, unspecified: Secondary | ICD-10-CM

## 2021-05-14 DIAGNOSIS — I959 Hypotension, unspecified: Secondary | ICD-10-CM | POA: Diagnosis not present

## 2021-05-14 DIAGNOSIS — K5900 Constipation, unspecified: Secondary | ICD-10-CM

## 2021-05-14 DIAGNOSIS — F319 Bipolar disorder, unspecified: Secondary | ICD-10-CM | POA: Diagnosis present

## 2021-05-14 DIAGNOSIS — C787 Secondary malignant neoplasm of liver and intrahepatic bile duct: Secondary | ICD-10-CM | POA: Diagnosis present

## 2021-05-14 DIAGNOSIS — R627 Adult failure to thrive: Secondary | ICD-10-CM | POA: Diagnosis present

## 2021-05-14 DIAGNOSIS — Z803 Family history of malignant neoplasm of breast: Secondary | ICD-10-CM

## 2021-05-14 DIAGNOSIS — Z800 Family history of malignant neoplasm of digestive organs: Secondary | ICD-10-CM

## 2021-05-14 DIAGNOSIS — K769 Liver disease, unspecified: Secondary | ICD-10-CM

## 2021-05-14 DIAGNOSIS — N1832 Chronic kidney disease, stage 3b: Secondary | ICD-10-CM | POA: Diagnosis present

## 2021-05-14 DIAGNOSIS — K5641 Fecal impaction: Secondary | ICD-10-CM | POA: Diagnosis not present

## 2021-05-14 DIAGNOSIS — N281 Cyst of kidney, acquired: Secondary | ICD-10-CM | POA: Diagnosis present

## 2021-05-14 DIAGNOSIS — Z515 Encounter for palliative care: Secondary | ICD-10-CM

## 2021-05-14 DIAGNOSIS — F79 Unspecified intellectual disabilities: Secondary | ICD-10-CM | POA: Diagnosis present

## 2021-05-14 DIAGNOSIS — R2681 Unsteadiness on feet: Secondary | ICD-10-CM | POA: Diagnosis present

## 2021-05-14 DIAGNOSIS — F05 Delirium due to known physiological condition: Secondary | ICD-10-CM

## 2021-05-14 DIAGNOSIS — R590 Localized enlarged lymph nodes: Secondary | ICD-10-CM | POA: Diagnosis present

## 2021-05-14 DIAGNOSIS — R531 Weakness: Secondary | ICD-10-CM

## 2021-05-14 DIAGNOSIS — D72829 Elevated white blood cell count, unspecified: Secondary | ICD-10-CM | POA: Diagnosis not present

## 2021-05-14 DIAGNOSIS — Z79899 Other long term (current) drug therapy: Secondary | ICD-10-CM

## 2021-05-14 HISTORY — DX: Schizoaffective disorder, unspecified: F25.9

## 2021-05-14 LAB — CBC WITH DIFFERENTIAL/PLATELET
Abs Immature Granulocytes: 0.07 10*3/uL (ref 0.00–0.07)
Basophils Absolute: 0 10*3/uL (ref 0.0–0.1)
Basophils Relative: 0 %
Eosinophils Absolute: 0 10*3/uL (ref 0.0–0.5)
Eosinophils Relative: 0 %
HCT: 35.2 % — ABNORMAL LOW (ref 36.0–46.0)
Hemoglobin: 11.1 g/dL — ABNORMAL LOW (ref 12.0–15.0)
Immature Granulocytes: 1 %
Lymphocytes Relative: 9 %
Lymphs Abs: 1.2 10*3/uL (ref 0.7–4.0)
MCH: 28.1 pg (ref 26.0–34.0)
MCHC: 31.5 g/dL (ref 30.0–36.0)
MCV: 89.1 fL (ref 80.0–100.0)
Monocytes Absolute: 1.3 10*3/uL — ABNORMAL HIGH (ref 0.1–1.0)
Monocytes Relative: 10 %
Neutro Abs: 10.5 10*3/uL — ABNORMAL HIGH (ref 1.7–7.7)
Neutrophils Relative %: 80 %
Platelets: 361 10*3/uL (ref 150–400)
RBC: 3.95 MIL/uL (ref 3.87–5.11)
RDW: 15 % (ref 11.5–15.5)
WBC: 13.1 10*3/uL — ABNORMAL HIGH (ref 4.0–10.5)
nRBC: 0 % (ref 0.0–0.2)

## 2021-05-14 LAB — URINALYSIS, ROUTINE W REFLEX MICROSCOPIC
Bilirubin Urine: NEGATIVE
Glucose, UA: NEGATIVE mg/dL
Ketones, ur: NEGATIVE mg/dL
Nitrite: NEGATIVE
Protein, ur: NEGATIVE mg/dL
Specific Gravity, Urine: 1.003 — ABNORMAL LOW (ref 1.005–1.030)
pH: 6 (ref 5.0–8.0)

## 2021-05-14 LAB — COMPREHENSIVE METABOLIC PANEL
ALT: 43 U/L (ref 0–44)
AST: 104 U/L — ABNORMAL HIGH (ref 15–41)
Albumin: 3.2 g/dL — ABNORMAL LOW (ref 3.5–5.0)
Alkaline Phosphatase: 150 U/L — ABNORMAL HIGH (ref 38–126)
Anion gap: 11 (ref 5–15)
BUN: 15 mg/dL (ref 8–23)
CO2: 23 mmol/L (ref 22–32)
Calcium: 9.7 mg/dL (ref 8.9–10.3)
Chloride: 103 mmol/L (ref 98–111)
Creatinine, Ser: 2.68 mg/dL — ABNORMAL HIGH (ref 0.44–1.00)
GFR, Estimated: 20 mL/min — ABNORMAL LOW (ref >60)
Glucose, Bld: 143 mg/dL — ABNORMAL HIGH (ref 70–99)
Potassium: 3.8 mmol/L (ref 3.5–5.1)
Sodium: 137 mmol/L (ref 135–145)
Total Bilirubin: 1.1 mg/dL (ref 0.3–1.2)
Total Protein: 6.5 g/dL (ref 6.5–8.1)

## 2021-05-14 MED ORDER — SODIUM CHLORIDE 0.9 % IV BOLUS
1000.0000 mL | Freq: Once | INTRAVENOUS | Status: AC
  Administered 2021-05-15: 1000 mL via INTRAVENOUS

## 2021-05-14 NOTE — ED Notes (Signed)
Pt ambulated in the hallway with 2 assist. Pt noted to have unsteady, shuffling gait.

## 2021-05-14 NOTE — ED Notes (Signed)
Patient transported to CT 

## 2021-05-14 NOTE — ED Triage Notes (Signed)
Pt arrives via EMS from [facility] assisted Living facility for generalized that started today. Pt reports unwitnessed fall yesterday and possibly hit her head but denies LOC. Does not take blood thinners. Pt states that her gait and speech are "off." Pt facility suspects UTI.

## 2021-05-14 NOTE — ED Notes (Signed)
Pt requesting a drink at this time for her dry mouth. This RN explained to pt that her ordered CT must be completed and resulted before staff is able to provide food or drink.

## 2021-05-14 NOTE — ED Notes (Signed)
Brandon Melnick brother [phone] requesting an update

## 2021-05-14 NOTE — ED Provider Notes (Addendum)
[facility] EMERGENCY DEPARTMENT Provider Note   CSN: [id number] Arrival date & time: 05/14/21  1757     History Chief Complaint  Patient presents with   Weakness    Megan Miranda is a 60 y.o. female.  Patient presents chief complaint of unsteady gait and fall yesterday.  She states that she noticed her gait felt different yesterday she was holding onto things to walk.  She lost her balance and fell yesterday.  Symptoms continued today and she presents to the ER.  Otherwise denies any headache or neck pain.  Denies loss of consciousness.  Denies chest pain.  Denies fevers or cough or vomiting or diarrhea.  She otherwise presents from a facility where she gets care.      Past Medical History:  Diagnosis Date   Bipolar disorder ([place])    Schizoaffective disorder [facility])     Patient Active Problem List   Diagnosis Date Noted   Schizoaffective disorder, manic type ([place]) 12/03/2018   SIRS (systemic inflammatory response syndrome) ([place]) 10/16/2017   Lactic acidosis 10/16/2017   Abnormal TSH 10/16/2017   History of alcohol abuse 10/16/2017   Lithium toxicity 10/15/2017   Bipolar I disorder ([place]) 07/21/2007    No past surgical history on file.   OB History   No obstetric history on file.     No family history on file.  Social History   Tobacco Use   Smoking status: Some Days    Packs/day: 0.10    Years: 2.00    Pack years: 0.20    Types: Cigarettes   Smokeless tobacco: Never   Tobacco comments:    Smokes a few cigarettes a week.   Vaping Use   Vaping Use: Never used  Substance Use Topics   Alcohol use: No   Drug use: No    Home Medications Prior to Admission medications   Medication Sig Start Date End Date Taking? Authorizing Provider  ferrous sulfate 325 (65 FE) MG tablet Take 325 mg by mouth daily.   Yes [provider]  lamoTRIgine (LAMICTAL) 200 MG tablet Take 1 tablet (200 mg total) by mouth daily. 04/14/18  Yes Plovsky,  Berneta Sages, MD  loratadine (CLARITIN) 10 MG tablet Take 10 mg by mouth daily.   Yes [provider]  LORazepam (ATIVAN) 1 MG tablet Take 1 mg by mouth 2 (two) times daily. 05/08/21  Yes [provider]  OLANZapine (ZYPREXA) 20 MG tablet Take 10 mg by mouth 2 (two) times daily. Half tablet by mouth twice daily 05/13/21  Yes [provider]  fluPHENAZine (PROLIXIN) 1 MG tablet Take 1 tablet (1 mg total) by mouth 2 (two) times daily. Patient not taking: Reported on 11/29/2018 03/09/18   Norma Fredrickson, MD  lithium carbonate 150 MG capsule Take 1 capsule (150 mg total) by mouth 2 (two) times daily with a meal. Patient not taking: Reported on 11/29/2018 03/09/18 03/09/19  Norma Fredrickson, MD    Allergies    Bee venom and Soap  Review of Systems   Review of Systems  Constitutional:  Negative for fever.  HENT:  Negative for ear pain.   Eyes:  Negative for pain.  Respiratory:  Negative for cough.   Cardiovascular:  Negative for chest pain.  Gastrointestinal:  Negative for abdominal pain.  Genitourinary:  Negative for flank pain.  Musculoskeletal:  Negative for back pain.  Skin:  Negative for rash.  Neurological:  Negative for headaches.   Physical Exam Updated Vital Signs BP  131/89 (BP Location: Left Arm)    Pulse 100    Temp 98.8 F (37.1 C) (Oral)    Resp 17    Ht 5\' 5"  (1.651 m)    Wt 55.8 kg    SpO2 99%    BMI 20.47 kg/m   Physical Exam Constitutional:      General: She is not in acute distress.    Appearance: Normal appearance.  HENT:     Head: Normocephalic.     Nose: Nose normal.  Eyes:     Extraocular Movements: Extraocular movements intact.  Cardiovascular:     Rate and Rhythm: Normal rate.  Pulmonary:     Effort: Pulmonary effort is normal.  Musculoskeletal:        General: Normal range of motion.     Cervical back: Normal range of motion.  Neurological:     General: No focal deficit present.     Mental Status: She is alert. Mental status is at  baseline.     Comments: Cranial nerves II to XII intact.  Facial droop or slurred speech noted. Strength 5/5 all extremities. Gait is unsteady requiring 2 nurses to hold her to ambulate.    ED Results / Procedures / Treatments   Labs (all labs ordered are listed, but only abnormal results are displayed) Labs Reviewed  CBC WITH DIFFERENTIAL/PLATELET - Abnormal; Notable for the following components:      Result Value   WBC 13.1 (*)    Hemoglobin 11.1 (*)    HCT 35.2 (*)    Neutro Abs 10.5 (*)    Monocytes Absolute 1.3 (*)    All other components within normal limits  COMPREHENSIVE METABOLIC PANEL - Abnormal; Notable for the following components:   Glucose, Bld 143 (*)    Creatinine, Ser 2.68 (*)    Albumin 3.2 (*)    AST 104 (*)    Alkaline Phosphatase 150 (*)    GFR, Estimated 20 (*)    All other components within normal limits  URINALYSIS, ROUTINE W REFLEX MICROSCOPIC - Abnormal; Notable for the following components:   Specific Gravity, Urine 1.003 (*)    Hgb urine dipstick SMALL (*)    Leukocytes,Ua TRACE (*)    Bacteria, UA RARE (*)    All other components within normal limits    EKG EKG Interpretation  Date/Time:  Wednesday May 14 2021 20:11:10 EST Ventricular Rate:  96 PR Interval:  134 QRS Duration: 82 QT Interval:  354 QTC Calculation: 447 R Axis:   -24 Text Interpretation: Normal sinus rhythm Low voltage QRS Borderline ECG Confirmed by Thamas Jaegers ([id number]) on 05/14/2021 8:42:43 PM  Radiology CT Head Wo Contrast  Result Date: 05/14/2021 CLINICAL DATA:  Head trauma, moderate-severe. Unsteady gait. Pt fell today, leg weakness EXAM: CT HEAD WITHOUT CONTRAST TECHNIQUE: Contiguous axial images were obtained from the base of the skull through the vertex without intravenous contrast. COMPARISON:  CT head 10/15/2017 BRAIN: BRAIN Prominence of the lateral ventricles may be related to central predominant atrophy, although a component of normal pressure/communicating  hydrocephalus cannot be excluded. No evidence of large-territorial acute infarction. No parenchymal hemorrhage. No mass lesion. No extra-axial collection. No mass effect or midline shift. No hydrocephalus. Basilar cisterns are patent. Vascular: No hyperdense vessel. Skull: No acute fracture or focal lesion. Sinuses/Orbits: Paranasal sinuses and mastoid air cells are clear. The orbits are unremarkable. Other: None. IMPRESSION: 1. No acute intracranial abnormality. 2. Prominence of the lateral ventricles may be related to central predominant atrophy,  although a component of normal pressure/communicating hydrocephalus cannot be excluded. Electronically Signed   By: Iven Finn M.D.   On: 05/14/2021 21:05    Procedures Procedures   Medications Ordered in ED Medications  sodium chloride 0.9 % bolus 1,000 mL (has no administration in time range)    ED Course  I have reviewed the triage vital signs and the nursing notes.  Pertinent labs & imaging results that were available during my care of the patient were reviewed by me and considered in my medical decision making (see chart for details).    MDM Rules/Calculators/A&P                         Patient has a persistently unsteady gait.  CT imaging of the brain shows prominence of the lateral ventricles.  MRI of the brain pursued.  No acute findings.  Labs show evidence of acute kidney injury.  Hospitalist consulted due to continued unsteady gait.   Final Clinical Impression(s) / ED Diagnoses Final diagnoses:  Unsteady gait    Rx / DC Orders ED Discharge Orders     None        Luna Fuse, MD 05/14/21 2313    Luna Fuse, MD 05/14/21 [phone]

## 2021-05-14 NOTE — H&P (Signed)
History and Physical    LATIA MATAYA ZOX:[id number] DOB: [birth year] DOA: 05/14/2021  PCP: Patient, No Pcp Per (Inactive)  Patient coming from: [facility]  I have personally briefly reviewed patient's old medical records in [place]  Chief Complaint: Unstable gait, generalized weakness  HPI: APPOLONIA ACKERT is a 60 y.o. female with medical history significant for bipolar 1 disorder, schizoaffective disorder, CKD stage IIIb who presented to the ED for evaluation of generalized weakness/unstable gait.  Patient states that she is currently living at an assisted living facility.  Over the last few days she has felt generally weak.  She says that she had a couple falls that occurred while she was walking and then her legs would give out on her.  She denied any associated lightheadedness, dizziness, chest pain, dyspnea, nausea, vomiting, abdominal pain, dysuria, diarrhea.  She denies any significant injury.  ED Course:  Initial vitals showed BP 110/64, pulse 105, RR 17, temp 98.0 F, SPO2 99% on room air.  Labs show BUN 15, creatinine 2.68 (baseline appears to be 1.4-1.6), sodium 137, potassium 3.8, bicarb 23, serum glucose 143, AST 104, ALT 43, alk phos 150, total bilirubin 1.1, WBC 13.1, hemoglobin 11.1, platelets 361,000.  Urinalysis shows negative nitrites, trace leukocytes, 0-5 RBCs and WBC/hpf, rare bacteria microscopy.  Respiratory panel collected and pending.  CT head without contrast is negative for acute intracranial abnormality.  Prominence of the lateral ventricles noted.  MRI brain without contrast (report available in PACS) is negative for acute intracranial infarct or other abnormality.  Mildly advanced vertebral atrophy for age noted.  Mild ventricular prominence related to global parenchymal volume loss without hydrocephalus noted.  Patient was given 1 L normal saline bolus.  The hospitalist service was consulted to admit for further evaluation and  management.  Review of Systems: All systems reviewed and are negative except as documented in history of present illness above.   Past Medical History:  Diagnosis Date   Bipolar disorder ([place])    Schizoaffective disorder ([place])     No past surgical history on file.  Social History:  reports that she has been smoking cigarettes. She has a 0.20 pack-year smoking history. She has never used smokeless tobacco. She reports that she does not drink alcohol and does not use drugs.  Allergies  Allergen Reactions   Bee Venom Itching   Soap Rash    No family history on file.   Prior to Admission medications   Medication Sig Start Date End Date Taking? Authorizing Provider  ferrous sulfate 325 (65 FE) MG tablet Take 325 mg by mouth daily.   Yes [provider]  lamoTRIgine (LAMICTAL) 200 MG tablet Take 1 tablet (200 mg total) by mouth daily. 04/14/18  Yes Plovsky, Berneta Sages, MD  loratadine (CLARITIN) 10 MG tablet Take 10 mg by mouth daily.   Yes [provider]  LORazepam (ATIVAN) 1 MG tablet Take 1 mg by mouth 2 (two) times daily. 05/08/21  Yes [provider]  OLANZapine (ZYPREXA) 20 MG tablet Take 10 mg by mouth 2 (two) times daily. Half tablet by mouth twice daily 05/13/21  Yes [provider]  fluPHENAZine (PROLIXIN) 1 MG tablet Take 1 tablet (1 mg total) by mouth 2 (two) times daily. Patient not taking: Reported on 11/29/2018 03/09/18   Norma Fredrickson, MD  lithium carbonate 150 MG capsule Take 1 capsule (150 mg total) by mouth 2 (two) times daily with a meal. Patient not taking: Reported on 11/29/2018 03/09/18  03/09/19  Norma Fredrickson, MD    Physical Exam: Vitals:   05/14/21 1758 05/14/21 2249  BP: 110/64 131/89  Pulse: (!) 105 100  Resp: 17 17  Temp: 98 F (36.7 C) 98.8 F (37.1 C)  TempSrc: Oral Oral  SpO2: 99% 99%  Weight: 55.8 kg   Height: '5\' 5"'  (1.651 m)    Constitutional: Resting in bed in the left lateral decubitus position, NAD,  calm, comfortable Eyes: PERRL, lids and conjunctivae normal ENMT: Mucous membranes are dry. Posterior pharynx clear of any exudate or lesions.Normal dentition.  Neck: normal, supple, no masses. Respiratory: clear to auscultation bilaterally, no wheezing, no crackles. Normal respiratory effort. No accessory muscle use.  Cardiovascular: Regular rate and rhythm, no murmurs / rubs / gallops. No extremity edema. 2+ pedal pulses. Abdomen: no tenderness, no masses palpated. No hepatosplenomegaly. Bowel sounds positive.  Musculoskeletal: no clubbing / cyanosis. No joint deformity upper and lower extremities. Good ROM, no contractures. Normal muscle tone.  Skin: Large ecchymosis RUE overlying the triceps area.  No rashes, lesions, ulcers. No induration.  Skin is dry. Neurologic: CN 2-12 grossly intact. Sensation intact. Strength 5/5 in all 4.  Resting tremor left hand. Psychiatric: Alert and oriented x 3.  Labs on Admission: I have personally reviewed following labs and imaging studies  CBC: Recent Labs  Lab 05/14/21 1811  WBC 13.1*  NEUTROABS 10.5*  HGB 11.1*  HCT 35.2*  MCV 89.1  PLT 226   Basic Metabolic Panel: Recent Labs  Lab 05/14/21 1811  NA 137  K 3.8  CL 103  CO2 23  GLUCOSE 143*  BUN 15  CREATININE 2.68*  CALCIUM 9.7   GFR: Estimated Creatinine Clearance: 19.4 mL/min (A) (by C-G formula based on SCr of 2.68 mg/dL (H)). Liver Function Tests: Recent Labs  Lab 05/14/21 1811  AST 104*  ALT 43  ALKPHOS 150*  BILITOT 1.1  PROT 6.5  ALBUMIN 3.2*   No results for input(s): LIPASE, AMYLASE in the last 168 hours. No results for input(s): AMMONIA in the last 168 hours. Coagulation Profile: No results for input(s): INR, PROTIME in the last 168 hours. Cardiac Enzymes: No results for input(s): CKTOTAL, CKMB, CKMBINDEX, TROPONINI in the last 168 hours. BNP (last 3 results) No results for input(s): PROBNP in the last 8760 hours. HbA1C: No results for input(s): HGBA1C in  the last 72 hours. CBG: No results for input(s): GLUCAP in the last 168 hours. Lipid Profile: No results for input(s): CHOL, HDL, LDLCALC, TRIG, CHOLHDL, LDLDIRECT in the last 72 hours. Thyroid Function Tests: No results for input(s): TSH, T4TOTAL, FREET4, T3FREE, THYROIDAB in the last 72 hours. Anemia Panel: No results for input(s): VITAMINB12, FOLATE, FERRITIN, TIBC, IRON, RETICCTPCT in the last 72 hours. Urine analysis:    Component Value Date/Time   COLORURINE YELLOW 05/14/2021 1811   APPEARANCEUR CLEAR 05/14/2021 1811   LABSPEC 1.003 (L) 05/14/2021 1811   PHURINE 6.0 05/14/2021 1811   GLUCOSEU NEGATIVE 05/14/2021 1811   HGBUR SMALL (A) 05/14/2021 1811   BILIRUBINUR NEGATIVE 05/14/2021 1811   KETONESUR NEGATIVE 05/14/2021 1811   PROTEINUR NEGATIVE 05/14/2021 1811   NITRITE NEGATIVE 05/14/2021 1811   LEUKOCYTESUR TRACE (A) 05/14/2021 1811    Radiological Exams on Admission: CT Head Wo Contrast  Result Date: 05/14/2021 CLINICAL DATA:  Head trauma, moderate-severe. Unsteady gait. Pt fell today, leg weakness EXAM: CT HEAD WITHOUT CONTRAST TECHNIQUE: Contiguous axial images were obtained from the base of the skull through the vertex without intravenous contrast. COMPARISON:  CT  head 10/15/2017 BRAIN: BRAIN Prominence of the lateral ventricles may be related to central predominant atrophy, although a component of normal pressure/communicating hydrocephalus cannot be excluded. No evidence of large-territorial acute infarction. No parenchymal hemorrhage. No mass lesion. No extra-axial collection. No mass effect or midline shift. No hydrocephalus. Basilar cisterns are patent. Vascular: No hyperdense vessel. Skull: No acute fracture or focal lesion. Sinuses/Orbits: Paranasal sinuses and mastoid air cells are clear. The orbits are unremarkable. Other: None. IMPRESSION: 1. No acute intracranial abnormality. 2. Prominence of the lateral ventricles may be related to central predominant atrophy,  although a component of normal pressure/communicating hydrocephalus cannot be excluded. Electronically Signed   By: Iven Finn M.D.   On: 05/14/2021 21:05    EKG: Personally reviewed. Normal sinus rhythm without acute ischemic changes.  Not significantly changed when compared to prior.  Assessment/Plan Principal Problem:   Acute kidney injury superimposed on chronic kidney disease ([place]) Active Problems:   Bipolar I disorder ([place])   Generalized weakness   ABEER DESKINS is a 60 y.o. female with medical history significant for bipolar 1 disorder, schizoaffective disorder, CKD stage IIIb who is admitted with AKI on CKD stage IIIb.  Acute kidney injury superimposed on CKD stage IIIb: Creatinine 2.68 on admission, previous baseline appears to be 1.4-1.6.  She is dehydrated on admission. -Continue IV fluid hydration overnight -Check renal ultrasound -Repeat labs in a.m.  Generalized weakness/abnormal gait/fall at facility: Patient reports generalized weakness with falls at her facility.  No focal neurological deficit on admission.  Requiring 2 person assist to ambulate in the ED.  MRI brain negative for acute abnormality. -PT/OT consult -Check orthostatic vitals  Bipolar 1/schizoaffective disorder: Continue home Lamictal 200 mg daily, Zyprexa 10 mg twice daily, Ativan 1 mg twice daily.  DVT prophylaxis: Subcutaneous heparin Code Status: Full code Family Communication: None present on admission Disposition Plan: From ALF and likely return to ALF pending clinical progress Consults called: None Level of care: Med-Surg Admission status:  Status is: Observation  The patient remains OBS appropriate and will d/c before 2 midnights.  Zada Finders MD [facility]  If 7PM-7AM, please contact night-coverage [email or web address]  05/15/2021, 12:43 AM

## 2021-05-15 ENCOUNTER — Observation Stay (HOSPITAL_COMMUNITY): Payer: BC Managed Care – PPO

## 2021-05-15 DIAGNOSIS — N179 Acute kidney failure, unspecified: Secondary | ICD-10-CM | POA: Diagnosis present

## 2021-05-15 DIAGNOSIS — D649 Anemia, unspecified: Secondary | ICD-10-CM | POA: Diagnosis not present

## 2021-05-15 DIAGNOSIS — F05 Delirium due to known physiological condition: Secondary | ICD-10-CM | POA: Diagnosis present

## 2021-05-15 DIAGNOSIS — I959 Hypotension, unspecified: Secondary | ICD-10-CM | POA: Diagnosis not present

## 2021-05-15 DIAGNOSIS — G319 Degenerative disease of nervous system, unspecified: Secondary | ICD-10-CM | POA: Diagnosis present

## 2021-05-15 DIAGNOSIS — N281 Cyst of kidney, acquired: Secondary | ICD-10-CM | POA: Diagnosis present

## 2021-05-15 DIAGNOSIS — R531 Weakness: Secondary | ICD-10-CM | POA: Diagnosis not present

## 2021-05-15 DIAGNOSIS — I129 Hypertensive chronic kidney disease with stage 1 through stage 4 chronic kidney disease, or unspecified chronic kidney disease: Secondary | ICD-10-CM | POA: Diagnosis present

## 2021-05-15 DIAGNOSIS — F319 Bipolar disorder, unspecified: Secondary | ICD-10-CM | POA: Diagnosis not present

## 2021-05-15 DIAGNOSIS — N170 Acute kidney failure with tubular necrosis: Secondary | ICD-10-CM | POA: Diagnosis present

## 2021-05-15 DIAGNOSIS — Z515 Encounter for palliative care: Secondary | ICD-10-CM | POA: Diagnosis not present

## 2021-05-15 DIAGNOSIS — F250 Schizoaffective disorder, bipolar type: Secondary | ICD-10-CM | POA: Diagnosis present

## 2021-05-15 DIAGNOSIS — D6489 Other specified anemias: Secondary | ICD-10-CM | POA: Diagnosis present

## 2021-05-15 DIAGNOSIS — R2681 Unsteadiness on feet: Secondary | ICD-10-CM | POA: Diagnosis present

## 2021-05-15 DIAGNOSIS — K769 Liver disease, unspecified: Secondary | ICD-10-CM | POA: Diagnosis not present

## 2021-05-15 DIAGNOSIS — Z66 Do not resuscitate: Secondary | ICD-10-CM | POA: Diagnosis not present

## 2021-05-15 DIAGNOSIS — R338 Other retention of urine: Secondary | ICD-10-CM | POA: Diagnosis present

## 2021-05-15 DIAGNOSIS — Z7189 Other specified counseling: Secondary | ICD-10-CM | POA: Diagnosis not present

## 2021-05-15 DIAGNOSIS — R160 Hepatomegaly, not elsewhere classified: Secondary | ICD-10-CM | POA: Diagnosis not present

## 2021-05-15 DIAGNOSIS — E870 Hyperosmolality and hypernatremia: Secondary | ICD-10-CM | POA: Diagnosis not present

## 2021-05-15 DIAGNOSIS — R590 Localized enlarged lymph nodes: Secondary | ICD-10-CM | POA: Diagnosis present

## 2021-05-15 DIAGNOSIS — N1832 Chronic kidney disease, stage 3b: Secondary | ICD-10-CM | POA: Diagnosis present

## 2021-05-15 DIAGNOSIS — D72829 Elevated white blood cell count, unspecified: Secondary | ICD-10-CM | POA: Diagnosis not present

## 2021-05-15 DIAGNOSIS — E860 Dehydration: Secondary | ICD-10-CM | POA: Diagnosis present

## 2021-05-15 DIAGNOSIS — N189 Chronic kidney disease, unspecified: Secondary | ICD-10-CM | POA: Diagnosis not present

## 2021-05-15 DIAGNOSIS — R627 Adult failure to thrive: Secondary | ICD-10-CM | POA: Diagnosis present

## 2021-05-15 DIAGNOSIS — Z20822 Contact with and (suspected) exposure to covid-19: Secondary | ICD-10-CM | POA: Diagnosis present

## 2021-05-15 DIAGNOSIS — K5641 Fecal impaction: Secondary | ICD-10-CM | POA: Diagnosis not present

## 2021-05-15 DIAGNOSIS — Z6820 Body mass index (BMI) 20.0-20.9, adult: Secondary | ICD-10-CM | POA: Diagnosis not present

## 2021-05-15 DIAGNOSIS — F17210 Nicotine dependence, cigarettes, uncomplicated: Secondary | ICD-10-CM | POA: Diagnosis present

## 2021-05-15 DIAGNOSIS — C787 Secondary malignant neoplasm of liver and intrahepatic bile duct: Secondary | ICD-10-CM | POA: Diagnosis present

## 2021-05-15 DIAGNOSIS — R1310 Dysphagia, unspecified: Secondary | ICD-10-CM | POA: Diagnosis not present

## 2021-05-15 DIAGNOSIS — F79 Unspecified intellectual disabilities: Secondary | ICD-10-CM | POA: Diagnosis present

## 2021-05-15 LAB — COMPREHENSIVE METABOLIC PANEL
ALT: 40 U/L (ref 0–44)
AST: 80 U/L — ABNORMAL HIGH (ref 15–41)
Albumin: 2.8 g/dL — ABNORMAL LOW (ref 3.5–5.0)
Alkaline Phosphatase: 147 U/L — ABNORMAL HIGH (ref 38–126)
Anion gap: 12 (ref 5–15)
BUN: 13 mg/dL (ref 8–23)
CO2: 18 mmol/L — ABNORMAL LOW (ref 22–32)
Calcium: 8.8 mg/dL — ABNORMAL LOW (ref 8.9–10.3)
Chloride: 105 mmol/L (ref 98–111)
Creatinine, Ser: 2.14 mg/dL — ABNORMAL HIGH (ref 0.44–1.00)
GFR, Estimated: 26 mL/min — ABNORMAL LOW (ref >60)
Glucose, Bld: 158 mg/dL — ABNORMAL HIGH (ref 70–99)
Potassium: 3.5 mmol/L (ref 3.5–5.1)
Sodium: 135 mmol/L (ref 135–145)
Total Bilirubin: 1 mg/dL (ref 0.3–1.2)
Total Protein: 5.6 g/dL — ABNORMAL LOW (ref 6.5–8.1)

## 2021-05-15 LAB — RESP PANEL BY RT-PCR (FLU A&B, COVID) ARPGX2
Influenza A by PCR: NEGATIVE
Influenza B by PCR: NEGATIVE
SARS Coronavirus 2 by RT PCR: NEGATIVE

## 2021-05-15 LAB — HEPATITIS PANEL, ACUTE
HCV Ab: NONREACTIVE
Hep A IgM: NONREACTIVE
Hep B C IgM: NONREACTIVE
Hepatitis B Surface Ag: NONREACTIVE

## 2021-05-15 LAB — CBC
HCT: 31.5 % — ABNORMAL LOW (ref 36.0–46.0)
Hemoglobin: 9.9 g/dL — ABNORMAL LOW (ref 12.0–15.0)
MCH: 28 pg (ref 26.0–34.0)
MCHC: 31.4 g/dL (ref 30.0–36.0)
MCV: 89 fL (ref 80.0–100.0)
Platelets: 248 10*3/uL (ref 150–400)
RBC: 3.54 MIL/uL — ABNORMAL LOW (ref 3.87–5.11)
RDW: 15 % (ref 11.5–15.5)
WBC: 9.6 10*3/uL (ref 4.0–10.5)
nRBC: 0 % (ref 0.0–0.2)

## 2021-05-15 LAB — TSH: TSH: 1.106 u[IU]/mL (ref 0.350–4.500)

## 2021-05-15 LAB — HIV ANTIBODY (ROUTINE TESTING W REFLEX): HIV Screen 4th Generation wRfx: NONREACTIVE

## 2021-05-15 LAB — IRON AND TIBC
Iron: 27 ug/dL — ABNORMAL LOW (ref 28–170)
Saturation Ratios: 13 % (ref 10.4–31.8)
TIBC: 213 ug/dL — ABNORMAL LOW (ref 250–450)
UIBC: 186 ug/dL

## 2021-05-15 LAB — RETICULOCYTES
Immature Retic Fract: 16.9 % — ABNORMAL HIGH (ref 2.3–15.9)
RBC.: 3.8 MIL/uL — ABNORMAL LOW (ref 3.87–5.11)
Retic Count, Absolute: 59.7 10*3/uL (ref 19.0–186.0)
Retic Ct Pct: 1.6 % (ref 0.4–3.1)

## 2021-05-15 LAB — CK: Total CK: 630 U/L — ABNORMAL HIGH (ref 38–234)

## 2021-05-15 LAB — FOLATE: Folate: 35.8 ng/mL (ref >5.9)

## 2021-05-15 LAB — VITAMIN B12: Vitamin B-12: 1110 pg/mL — ABNORMAL HIGH (ref 180–914)

## 2021-05-15 LAB — FERRITIN: Ferritin: 360 ng/mL — ABNORMAL HIGH (ref 11–307)

## 2021-05-15 MED ORDER — HEPARIN SODIUM (PORCINE) 5000 UNIT/ML IJ SOLN
5000.0000 [IU] | Freq: Three times a day (TID) | INTRAMUSCULAR | Status: DC
  Administered 2021-05-15 – 2021-05-27 (×27): 5000 [IU] via SUBCUTANEOUS
  Filled 2021-05-15 (×26): qty 1

## 2021-05-15 MED ORDER — NICOTINE 14 MG/24HR TD PT24
14.0000 mg | MEDICATED_PATCH | Freq: Every day | TRANSDERMAL | Status: DC
  Administered 2021-05-15 – 2021-06-06 (×22): 14 mg via TRANSDERMAL
  Filled 2021-05-15 (×23): qty 1

## 2021-05-15 MED ORDER — LAMOTRIGINE 100 MG PO TABS
200.0000 mg | ORAL_TABLET | Freq: Every day | ORAL | Status: DC
  Administered 2021-05-15 – 2021-06-06 (×23): 200 mg via ORAL
  Filled 2021-05-15 (×23): qty 2

## 2021-05-15 MED ORDER — ACETAMINOPHEN 650 MG RE SUPP: 650.0000 mg | Freq: Four times a day (QID) | RECTAL | Status: DC | PRN

## 2021-05-15 MED ORDER — ONDANSETRON HCL 4 MG/2ML IJ SOLN: 4.0000 mg | Freq: Four times a day (QID) | INTRAMUSCULAR | Status: DC | PRN

## 2021-05-15 MED ORDER — OLANZAPINE 5 MG PO TABS
10.0000 mg | ORAL_TABLET | Freq: Two times a day (BID) | ORAL | Status: DC
  Administered 2021-05-15 – 2021-06-06 (×46): 10 mg via ORAL
  Filled 2021-05-15 (×37): qty 2
  Filled 2021-05-15: qty 1
  Filled 2021-05-15 (×3): qty 2
  Filled 2021-05-15: qty 1
  Filled 2021-05-15 (×4): qty 2
  Filled 2021-05-15: qty 1
  Filled 2021-05-15: qty 2

## 2021-05-15 MED ORDER — ACETAMINOPHEN 325 MG PO TABS
650.0000 mg | ORAL_TABLET | Freq: Four times a day (QID) | ORAL | Status: DC | PRN
  Administered 2021-05-15 – 2021-05-24 (×4): 650 mg via ORAL
  Filled 2021-05-15 (×4): qty 2

## 2021-05-15 MED ORDER — SODIUM CHLORIDE 0.9 % IV SOLN: INTRAVENOUS | Status: DC

## 2021-05-15 MED ORDER — ONDANSETRON HCL 4 MG PO TABS
4.0000 mg | ORAL_TABLET | Freq: Four times a day (QID) | ORAL | Status: DC | PRN
  Administered 2021-05-19: 4 mg via ORAL
  Filled 2021-05-15: qty 1

## 2021-05-15 MED ORDER — SODIUM CHLORIDE 0.9 % IV SOLN: INTRAVENOUS | Status: AC

## 2021-05-15 MED ORDER — LORAZEPAM 1 MG PO TABS
1.0000 mg | ORAL_TABLET | Freq: Two times a day (BID) | ORAL | Status: DC
  Administered 2021-05-15 – 2021-06-06 (×46): 1 mg via ORAL
  Filled 2021-05-15 (×46): qty 1

## 2021-05-15 NOTE — ED Notes (Signed)
Pt transported to MRI 

## 2021-05-15 NOTE — ED Notes (Signed)
Attempted to cath pt x 2. Due to pt being in hallway, did not attempt a 3rd time. Pt also had saturated brief. Will make receiving nurse aware. Pt placed in new brief and linen

## 2021-05-15 NOTE — ED Notes (Signed)
Provided pt water and cola per request

## 2021-05-15 NOTE — Evaluation (Signed)
Occupational Therapy Evaluation Patient Details Name: Megan Miranda MRN: [id number] DOB: 10-26-1960 Today's Date: 05/15/2021   History of Present Illness Megan Miranda is a 60 y.o. female with medical history significant for bipolar 1 disorder, schizoaffective disorder, CKD stage IIIb who presented to the ED for evaluation of generalized weakness/unstable gait and falls where legs gave out. Found to have acute kidney injury superimposed on chronic kidney disease, MRI brain negative for acute event.   Clinical Impression   This 60 yo female admitted with above presents to acute OT with PLOF of being totally independent with basic ADLs, IADLs, and driving. Currently she is min A-total A for all basic ADLs and Max A +2-Mod A for all mobility. She will continue to benefit from acute OT with follow up at SNF.     Recommendations for follow up therapy are one component of a multi-disciplinary discharge planning process, led by the attending physician.  Recommendations may be updated based on patient status, additional functional criteria and insurance authorization.   Follow Up Recommendations  Skilled nursing-short term rehab (<3 hours/day)    Assistance Recommended at Discharge Frequent or constant Supervision/Assistance  Functional Status Assessment  Patient has had a recent decline in their functional status and demonstrates the ability to make significant improvements in function in a reasonable and predictable amount of time.  Equipment Recommendations  Other (comment) (TBD next venue)       Precautions / Restrictions Precautions Precautions: Fall      Mobility Bed Mobility Overal bed mobility: Needs Assistance Bed Mobility: Supine to Sit;Sit to Supine     Supine to sit: Max assist;HOB elevated Sit to supine: Max assist;+2 for physical assistance   General bed mobility comments: assist for legs and trunk +1 max A to sit, to supine +2 for trunk and legs as pt on very EOB     Transfers Overall transfer level: Needs assistance Equipment used: 2 person hand held assist Transfers: Sit to/from Stand;Bed to chair/wheelchair/BSC Sit to Stand: Mod assist;From elevated surface Stand pivot transfers: Mod assist;+2 physical assistance         General transfer comment: up from stretcher in hallway in ED to sit in chair at bedside, tremors and crouch posture with short shuffling steps and L lateral lean; pivot to chair then back to stretcher      Balance Overall balance assessment: Needs assistance Sitting-balance support: Feet supported Sitting balance-Leahy Scale: Poor Sitting balance - Comments: L lateral lean in sitting unable to correct with cues and support; mod/max A for sitting balance Postural control: Left lateral lean Standing balance support: Bilateral upper extremity supported Standing balance-Leahy Scale: Poor Standing balance comment: bilat UE HHA for standing with mild L lateral lean                           ADL either performed or assessed with clinical judgement   ADL Overall ADL's : Needs assistance/impaired Eating/Feeding: Independent;Bed level Eating/Feeding Details (indicate cue type and reason): or supported sitting (in recliner) Grooming: Bed level;Minimal assistance Grooming Details (indicate cue type and reason): or supported sitting (in recliner) Upper Body Bathing: Minimal assistance;Bed level Upper Body Bathing Details (indicate cue type and reason): or supported sitting (in recliner) Lower Body Bathing: Maximal assistance Lower Body Bathing Details (indicate cue type and reason): Mod A +2 sit<>stand Upper Body Dressing : Maximal assistance;Bed level Upper Body Dressing Details (indicate cue type and reason): or supported sitting (in recliner) Lower  Body Dressing: Total assistance Lower Body Dressing Details (indicate cue type and reason): Mod A +2 sit<>stand Toilet Transfer: Moderate assistance;+2 for physical  assistance;Stand-pivot Toilet Transfer Details (indicate cue type and reason): Bed>recliner next to bed Toileting- Clothing Manipulation and Hygiene: Total assistance Toileting - Clothing Manipulation Details (indicate cue type and reason): Mod A +2 sit<>stand             Vision Patient Visual Report: No change from baseline              Pertinent Vitals/Pain Pain Assessment: 0-10 Pain Score: 4  Pain Location: R leg above knee Pain Descriptors / Indicators: Aching Pain Intervention(s): Monitored during session;Repositioned     Hand Dominance Right   Extremity/Trunk Assessment Upper Extremity Assessment Upper Extremity Assessment: Generalized weakness (UE tremors (Pt reports they are new))   Lower Extremity Assessment Lower Extremity Assessment: RLE deficits/detail;LLE deficits/detail RLE Deficits / Details: AROM WFL, strength hip flexion 3-/5, knee extension 4/5 with cues, decreased sensation in feet RLE Sensation: decreased light touch LLE Deficits / Details: AROM WFL, strength hip flexion 3-/5 knee extension 4/5, decreased sensation in feet LLE Sensation: decreased light touch   Cervical / Trunk Assessment Cervical / Trunk Assessment: Kyphotic   Communication Communication Communication: No difficulties   Cognition Arousal/Alertness: Awake/alert Behavior During Therapy: WFL for tasks assessed/performed Overall Cognitive Status: No family/caregiver present to determine baseline cognitive functioning Area of Impairment: Memory;Orientation;Following commands;Problem solving                 Orientation Level: Disoriented to;Time   Memory: Decreased short-term memory Following Commands: Follows one step commands with increased time;Follows one step commands consistently     Problem Solving: Slow processing;Decreased initiation;Requires verbal cues;Requires tactile cues       General Comments  pt with tremor, disheveled and with food stains on the sheets;  sat in chair for bed change            Home Living Family/patient expects to be discharged to:: Private residence Living Arrangements: Alone   Type of Home: Other(Comment) (Condo, but planning to move to a house) Home Access: Level entry     Home Layout: One level     Bathroom Shower/Tub: Occupational psychologist: Standard     Home Equipment: Shower seat   Additional Comments: stands to shower despite having seat      Prior Functioning/Environment Prior Level of Function : Independent/Modified Independent;History of Falls (last six months)             Mobility Comments: several falls recently due to illness          OT Problem List: Decreased strength;Decreased activity tolerance;Impaired balance (sitting and/or standing);Pain;Decreased cognition      OT Treatment/Interventions: Self-care/ADL training;DME and/or AE instruction;Patient/family education;Balance training    OT Goals(Current goals can be found in the care plan section) Acute Rehab OT Goals Patient Stated Goal: to get stronger and go home OT Goal Formulation: With patient Time For Goal Achievement: 05/29/21 Potential to Achieve Goals: Good  OT Frequency: Min 2X/week   Barriers to D/C: Decreased caregiver support          Co-evaluation PT/OT/SLP Co-Evaluation/Treatment: Yes Reason for Co-Treatment: For patient/therapist safety PT goals addressed during session: Mobility/safety with mobility;Balance OT goals addressed during session: ADL's and self-care;Strengthening/ROM      AM-PAC OT "6 Clicks" Daily Activity     Outcome Measure Help from another person eating meals?: A Little Help from another person taking  care of personal grooming?: A Little Help from another person toileting, which includes using toliet, bedpan, or urinal?: A Lot Help from another person bathing (including washing, rinsing, drying)?: A Lot Help from another person to put on and taking off regular upper  body clothing?: A Lot Help from another person to put on and taking off regular lower body clothing?: Total 6 Click Score: 13   End of Session Equipment Utilized During Treatment: Gait belt  Activity Tolerance: Patient tolerated treatment well Patient left:  (in strecher with rails up in hallway)  OT Visit Diagnosis: Unsteadiness on feet (R26.81);Other abnormalities of gait and mobility (R26.89);Muscle weakness (generalized) (M62.81);Pain;Other symptoms and signs involving cognitive function Pain - Right/Left: Right Pain - part of body: Leg                Time: 4128-2081 OT Time Calculation (min): 17 min Charges:  OT General Charges $OT Visit: 1 Visit OT Evaluation $OT Eval Moderate Complexity: 1 Mod  [place] [address], OTR/L Acute [employer] Pager [phone] Office [phone]     [place], [place] 05/15/2021, 10:23 AM

## 2021-05-15 NOTE — ED Notes (Signed)
Linen and gown changed. Pt ate all of breakfast

## 2021-05-15 NOTE — ED Notes (Signed)
In and out was attempted twice, pt is in hallway; did not attempted for a third time due to limited privacy. Pt changed out into new briefs with new linens at this time. Bed in placed at the lowest level with new warm blankets placed on pt.

## 2021-05-15 NOTE — Evaluation (Addendum)
Physical Therapy Evaluation Patient Details Name: Megan Miranda MRN: [id number] DOB: [birth year] Today's Date: 05/15/2021  History of Present Illness  Megan Miranda is a 60 y.o. female with medical history significant for bipolar 1 disorder, schizoaffective disorder, CKD stage IIIb who presented to the ED for evaluation of generalized weakness/unstable gait and falls where legs gave out. Found to have acute kidney injury superimposed on chronic kidney disease, MRI brain negative for acute event.  Clinical Impression  Patient presents with decreased mobility due to decreased sitting balance, decreased strength, decreased activity tolerance with pain R knee and fatigue.  She was living alone in ground level condo with plans to move to a house, but no caregivers available.  Currently mod A of 2 to stand max A for bed mobility.  She will need SNF level rehab prior to d/c.  PT to continue to follow acutely.        Recommendations for follow up therapy are one component of a multi-disciplinary discharge planning process, led by the attending physician.  Recommendations may be updated based on patient status, additional functional criteria and insurance authorization.  Follow Up Recommendations Skilled nursing-short term rehab (<3 hours/day)    Assistance Recommended at Discharge Frequent or constant Supervision/Assistance  Functional Status Assessment Patient has had a recent decline in their functional status and demonstrates the ability to make significant improvements in function in a reasonable and predictable amount of time.  Equipment Recommendations  Other (comment) (TBA)    Recommendations for Other Services       Precautions / Restrictions Precautions Precautions: Fall      Mobility  Bed Mobility Overal bed mobility: Needs Assistance Bed Mobility: Supine to Sit;Sit to Supine     Supine to sit: Max assist;HOB elevated Sit to supine: Max assist;+2 for physical assistance    General bed mobility comments: assist for legs and trunk +1 max A to sit, to supine +2 for trunk and legs as pt on very EOB    Transfers Overall transfer level: Needs assistance Equipment used: 2 person hand held assist Transfers: Sit to/from Stand;Bed to chair/wheelchair/BSC Sit to Stand: Mod assist;From elevated surface Stand pivot transfers: Mod assist;+2 physical assistance         General transfer comment: up from stretcher in hallway in ED to sit in chair at bedside, tremors and crouch posture with short shuffling steps and L lateral lean; pivot to chair then back to stretcher    Ambulation/Gait                  Stairs            Wheelchair Mobility    Modified Rankin (Stroke Patients Only)       Balance Overall balance assessment: Needs assistance Sitting-balance support: Feet supported Sitting balance-Leahy Scale: Poor Sitting balance - Comments: L lateral lean in sitting unable to correct with cues and support; mod/max A for sitting balance Postural control: Left lateral lean Standing balance support: Bilateral upper extremity supported Standing balance-Leahy Scale: Poor Standing balance comment: bilat UE HHA for standing with mild L lateral lean                             Pertinent Vitals/Pain Pain Assessment: 0-10 Pain Score: 4  Pain Location: R leg above knee Pain Descriptors / Indicators: Aching Pain Intervention(s): Monitored during session;Repositioned    Home Living Family/patient expects to be discharged to:: Private residence Living Arrangements:  Alone   Type of Home: Other(Comment) (Condo, but planning to move to a house) Home Access: Level entry       Home Layout: One level Home Equipment: Shower seat Additional Comments: stands to shower despite having seat    Prior Function Prior Level of Function : Independent/Modified Independent;History of Falls (last six months)             Mobility Comments:  several falls recently due to illness       Hand Dominance   Dominant Hand: Right    Extremity/Trunk Assessment   Upper Extremity Assessment Upper Extremity Assessment: Defer to OT evaluation (UE tremors)    Lower Extremity Assessment Lower Extremity Assessment: RLE deficits/detail;LLE deficits/detail RLE Deficits / Details: AROM WFL, strength hip flexion 3-/5, knee extension 4/5 with cues, decreased sensation in feet RLE Sensation: decreased light touch LLE Deficits / Details: AROM WFL, strength hip flexion 3-/5 knee extension 4/5, decreased sensation in feet LLE Sensation: decreased light touch    Cervical / Trunk Assessment Cervical / Trunk Assessment: Kyphotic  Communication   Communication: No difficulties  Cognition Arousal/Alertness: Awake/alert Behavior During Therapy: WFL for tasks assessed/performed Overall Cognitive Status: No family/caregiver present to determine baseline cognitive functioning Area of Impairment: Memory;Orientation;Following commands;Problem solving                 Orientation Level: Disoriented to;Time   Memory: Decreased short-term memory Following Commands: Follows one step commands with increased time;Follows one step commands consistently     Problem Solving: Slow processing;Decreased initiation;Requires verbal cues;Requires tactile cues          General Comments General comments (skin integrity, edema, etc.): pt with tremor, disheveled and with food stains on the sheets; sat in chair for bed change    Exercises     Assessment/Plan    PT Assessment Patient needs continued PT services  PT Problem List Decreased strength;Decreased mobility;Decreased balance;Decreased cognition;Decreased activity tolerance;Decreased safety awareness;Pain       PT Treatment Interventions DME instruction;Therapeutic activities;Cognitive remediation;Patient/family education;Therapeutic exercise;Gait training;Balance training;Functional mobility  training    PT Goals (Current goals can be found in the Care Plan section)  Acute Rehab PT Goals Patient Stated Goal: agrees to rehab PT Goal Formulation: With patient Time For Goal Achievement: 05/29/21 Potential to Achieve Goals: Good    Frequency Min 2X/week   Barriers to discharge Decreased caregiver support      Co-evaluation PT/OT/SLP Co-Evaluation/Treatment: Yes Reason for Co-Treatment: For patient/therapist safety PT goals addressed during session: Mobility/safety with mobility;Balance OT goals addressed during session: ADL's and self-care;Strengthening/ROM       AM-PAC PT "6 Clicks" Mobility  Outcome Measure Help needed turning from your back to your side while in a flat bed without using bedrails?: Total Help needed moving from lying on your back to sitting on the side of a flat bed without using bedrails?: Total Help needed moving to and from a bed to a chair (including a wheelchair)?: Total Help needed standing up from a chair using your arms (e.g., wheelchair or bedside chair)?: Total Help needed to walk in hospital room?: Total Help needed climbing 3-5 steps with a railing? : Total 6 Click Score: 6    End of Session Equipment Utilized During Treatment: Gait belt Activity Tolerance: Patient limited by fatigue Patient left: in bed   PT Visit Diagnosis: Other abnormalities of gait and mobility (R26.89);History of falling (Z91.81);Muscle weakness (generalized) (M62.81);Other symptoms and signs involving the nervous system (G92.119)    Time: 4174-0814 PT  Time Calculation (min) (ACUTE ONLY): 17 min   Charges:   PT Evaluation $PT Eval Moderate Complexity: 1 Mod          Cyndi Solan Vosler, PT Acute Rehabilitation Services [id number]:[phone] Office:[phone] 05/15/2021   Reginia Naas 05/15/2021, 10:13 AM

## 2021-05-15 NOTE — Progress Notes (Signed)
New Admission Note:  Arrival Method:Stretcher from ER  Mental Orientation: A & O x 4 Telemetry:n/a Assessment: Completed Skin:bruise on R buttocks, Bruise on L posterior arm, large  bruise, IV infiltration on R arm.  IV: infiltration  Pain:0/10 Safety Measures: Safety Fall Prevention Plan was given, discussed. Admission: Completed 108M 01: Patient has been orientated to the room, unit and the staff. Family:Per pt none   Orders have been reviewed and implemented. Will continue to monitor the patient. Call light has been placed within reach and bed alarm has been activated.   Arta Silence ,RN

## 2021-05-15 NOTE — ED Notes (Signed)
Pt c/o urinary incontinence. This RN, Lavella Lemons NT, and Carlton NT cleaned pt, placed pt in clean brief, changed pt's linens, and placed warm blankets over pt.

## 2021-05-15 NOTE — Progress Notes (Addendum)
[facility] PROGRESS NOTE   Megan Miranda OXB:[id number] DOB: Oct 14, 1960 DOA: 05/14/2021  PCP: Patient, No Pcp Per (Inactive)  Brief History/Interval Summary: 60 y.o. female with medical history significant for bipolar 1 disorder, schizoaffective disorder, CKD stage IIIb who presented to the ED for evaluation of generalized weakness/unstable gait.  Over a period of few days prior to admission she had been feeling weaker than usual.  Usually does not require a walker or any other assistive devices.  Found to have acute kidney injury.  MRI brain did not show any acute findings.  Patient was hospitalized for further management.  Reason for Visit: Acute kidney injury  Consultants: None  Procedures: None    Subjective/Interval History: Patient denies any back pains.  No headaches.  She is somewhat puzzled by her unsteady gait over the last few days.  Denies any major injuries or significant falls.  She mentioned that her legs buckled under her.  Has urinated overnight.   Assessment/Plan:  Acute kidney injury superimposed on chronic kidney disease stage IIIb Baseline creatinine appears to be between 1.4-1.6.  Appear to be dehydrated on admission.  Creatinine was 2.68 on presentation.  Was given IV fluids with some improvement noted this morning.  Monitor urine output.  Avoid nephrotoxic agents.  Renal ultrasound does not show any hydronephrosis.  Continue to check daily labs for now.  Generalized weakness/unsteady gait MRI brain did not show any acute findings.  Patient denies any back pains.  Wonder if her weakness was due to worsening of her kidney function.  Will check B12 folate acid and TSH levels.  PT and OT evaluation.  May need to pursue this further if there is no improvement.  History of bipolar disorder/schizoaffective disorder Home medications been continued including Lamictal, Zyprexa and Ativan.  Normocytic anemia Drop in hemoglobin likely dilutional.  No evidence  of overt bleeding.  We will check anemia panel.  Abnormal LFTs/liver lesion Mildly abnormal alkaline phosphatase and AST levels noted.  Significance unclear.  Abdomen is benign.  Recheck over the next few days. Renal ultrasound did incidentally show large hyperechoic liver lesions.  No previous hepatobiliary imaging studies are noted in the chart.  We will proceed with MRI though cannot give contrast at this time due to her acute kidney injury.  ADDENDUM MRI of the liver was performed.  Showed multiple liver lesions very concerning for metastatic process.  Adenopathy also noted in the upper abdomen.  Primary is unknown at this time.  Went to discuss these findings with the patient but she was fast asleep.  Will discuss with her tomorrow morning.  We will check CEA levels.  Patient would need to undergo CT chest abdomen and pelvis but will wait for renal function to improve before ordering the studies.  MRI also incidentally suggested possible urinary retention.  Bladder scan has been ordered.  Left renal cyst Noted to be simple cyst on ultrasound.    DVT Prophylaxis: Subcutaneous heparin Code Status: Full code Family Communication: Discussed with patient Disposition Plan: She is here from an assisted living facility  Status is: Observation  The patient will require care spanning > 2 midnights and should be moved to inpatient because: Acute kidney injury, continued need for IV fluids   Medications: Scheduled:  heparin  5,000 Units Subcutaneous Q8H   lamoTRIgine  200 mg Oral Daily   LORazepam  1 mg Oral BID   OLANZapine  10 mg Oral BID   Continuous:  sodium chloride 100 mL/hr at  05/15/21 0209   HQR:FXJOITGPQDIYM **OR** acetaminophen, ondansetron **OR** ondansetron (ZOFRAN) IV  Antibiotics: Anti-infectives (From admission, onward)    None       Objective:  Vital Signs  Vitals:   05/14/21 1758 05/14/21 2249 05/15/21 0329 05/15/21 0700  BP: 110/64 131/89 (!) 125/99 119/73   Pulse: (!) 105 100 99 96  Resp: 17 17 17 17   Temp: 98 F (36.7 C) 98.8 F (37.1 C) 97.6 F (36.4 C) 98 F (36.7 C)  TempSrc: Oral Oral Oral Oral  SpO2: 99% 99% 97% 99%  Weight: 55.8 kg     Height: 5\' 5"  (1.651 m)      No intake or output data in the 24 hours ending 05/15/21 0924 Filed Weights   05/14/21 1758  Weight: 55.8 kg    General appearance: Awake alert.  In no distress Resp: Clear to auscultation bilaterally.  Normal effort Cardio: S1-S2 is normal regular.  No S3-S4.  No rubs murmurs or bruit GI: Abdomen is soft.  Nontender nondistended.  Bowel sounds are present normal.  No masses organomegaly Extremities: No edema.   Neurologic: Alert and oriented x3.  No facial asymmetry.  Strength is 5-5 upper extremities.  Is able to lift her legs off the bed but noted to be weaker in the legs compared to her arms.  Could be due to body positioning in the bed.   Lab Results:  Data Reviewed: I have personally reviewed following labs and imaging studies  CBC: Recent Labs  Lab 05/14/21 1811 05/15/21 0520  WBC 13.1* 9.6  NEUTROABS 10.5*  --   HGB 11.1* 9.9*  HCT 35.2* 31.5*  MCV 89.1 89.0  PLT 361 415    Basic Metabolic Panel: Recent Labs  Lab 05/14/21 1811 05/15/21 0520  NA 137 135  K 3.8 3.5  CL 103 105  CO2 23 18*  GLUCOSE 143* 158*  BUN 15 13  CREATININE 2.68* 2.14*  CALCIUM 9.7 8.8*    GFR: Estimated Creatinine Clearance: 24.3 mL/min (A) (by C-G formula based on SCr of 2.14 mg/dL (H)).  Liver Function Tests: Recent Labs  Lab 05/14/21 1811 05/15/21 0520  AST 104* 80*  ALT 43 40  ALKPHOS 150* 147*  BILITOT 1.1 1.0  PROT 6.5 5.6*  ALBUMIN 3.2* 2.8*     Recent Results (from the past 240 hour(s))  Resp Panel by RT-PCR (Flu A&B, Covid) Nasopharyngeal Swab     Status: None   Collection Time: 05/15/21 12:10 AM   Specimen: Nasopharyngeal Swab; Nasopharyngeal(NP) swabs in vial transport medium  Result Value Ref Range Status   SARS Coronavirus 2 by  RT PCR NEGATIVE NEGATIVE Final    Comment: (NOTE) SARS-CoV-2 target nucleic acids are NOT DETECTED.  The SARS-CoV-2 RNA is generally detectable in upper respiratory specimens during the acute phase of infection. The lowest concentration of SARS-CoV-2 viral copies this assay can detect is 138 copies/mL. A negative result does not preclude SARS-Cov-2 infection and should not be used as the sole basis for treatment or other patient management decisions. A negative result may occur with  improper specimen collection/handling, submission of specimen other than nasopharyngeal swab, presence of viral mutation(s) within the areas targeted by this assay, and inadequate number of viral copies(<138 copies/mL). A negative result must be combined with clinical observations, patient history, and epidemiological information. The expected result is Negative.  Fact Sheet for Patients:  EntrepreneurPulse.com.au  Fact Sheet for Healthcare Providers:  IncredibleEmployment.be  This test is no t yet approved or cleared  by the [place] and  has been authorized for detection and/or diagnosis of SARS-CoV-2 by FDA under an Emergency Use Authorization (EUA). This EUA will remain  in effect (meaning this test can be used) for the duration of the COVID-19 declaration under Section 564(b)(1) of the Act, 21 U.S.C.section 360bbb-3(b)(1), unless the authorization is terminated  or revoked sooner.       Influenza A by PCR NEGATIVE NEGATIVE Final   Influenza B by PCR NEGATIVE NEGATIVE Final    Comment: (NOTE) The Xpert Xpress SARS-CoV-2/FLU/RSV plus assay is intended as an aid in the diagnosis of influenza from Nasopharyngeal swab specimens and should not be used as a sole basis for treatment. Nasal washings and aspirates are unacceptable for Xpert Xpress SARS-CoV-2/FLU/RSV testing.  Fact Sheet for Patients: EntrepreneurPulse.com.au  Fact Sheet  for Healthcare Providers: IncredibleEmployment.be  This test is not yet approved or cleared by the [place] FDA and has been authorized for detection and/or diagnosis of SARS-CoV-2 by FDA under an Emergency Use Authorization (EUA). This EUA will remain in effect (meaning this test can be used) for the duration of the COVID-19 declaration under Section 564(b)(1) of the Act, 21 U.S.C. section 360bbb-3(b)(1), unless the authorization is terminated or revoked.  Performed at [facility], [place] [address], [place], [place] [address]       Radiology Studies: CT Head Wo Contrast  Result Date: 05/14/2021 CLINICAL DATA:  Head trauma, moderate-severe. Unsteady gait. Pt fell today, leg weakness EXAM: CT HEAD WITHOUT CONTRAST TECHNIQUE: Contiguous axial images were obtained from the base of the skull through the vertex without intravenous contrast. COMPARISON:  CT head 10/15/2017 BRAIN: BRAIN Prominence of the lateral ventricles may be related to central predominant atrophy, although a component of normal pressure/communicating hydrocephalus cannot be excluded. No evidence of large-territorial acute infarction. No parenchymal hemorrhage. No mass lesion. No extra-axial collection. No mass effect or midline shift. No hydrocephalus. Basilar cisterns are patent. Vascular: No hyperdense vessel. Skull: No acute fracture or focal lesion. Sinuses/Orbits: Paranasal sinuses and mastoid air cells are clear. The orbits are unremarkable. Other: None. IMPRESSION: 1. No acute intracranial abnormality. 2. Prominence of the lateral ventricles may be related to central predominant atrophy, although a component of normal pressure/communicating hydrocephalus cannot be excluded. Electronically Signed   By: Iven Finn M.D.   On: 05/14/2021 21:05   MR BRAIN WO CONTRAST  Result Date: 05/14/2021 CLINICAL DATA:  Initial evaluation for neuro deficit, stroke suspected. CT locking glass date fetal  enough locking read it dome locking read no all dysfunction read 5 deflecting look at soccer practice EXAM: MRI HEAD WITHOUT CONTRAST TECHNIQUE: Multiplanar, multiecho pulse sequences of the brain and surrounding structures were obtained without intravenous contrast. COMPARISON:  Prior CT from earlier the same day. FINDINGS: Brain: Mild diffuse prominence of the CSF containing spaces compatible with generalized cerebral atrophy, mildly advanced for age. No significant cerebral white matter disease. No abnormal foci of restricted diffusion to suggest acute or subacute ischemia. Gray-white matter differentiation maintained. No encephalomalacia to suggest chronic cortical infarction. No evidence for acute or chronic intracranial hemorrhage. No mass lesion, midline shift or mass effect. Mild ventricular prominence related to global parenchymal volume loss without hydrocephalus. No extra-axial fluid collection. Pituitary gland suprasellar region within normal limits. Midline structures intact and normal. Vascular: Major intracranial vascular flow voids are maintained. Skull and upper cervical spine: Craniocervical junction within normal limits. Degenerative spondylosis noted at C4-5 and C5-6 with resultant mild to moderate spinal stenosis.  Bone marrow signal intensity within normal limits. No scalp soft tissue abnormality. Sinuses/Orbits: Globes and orbital soft tissues within normal limits. Paranasal sinuses are largely clear. Trace right mastoid effusion, of doubtful significance. Inner ear structures grossly normal. Other: None. IMPRESSION: 1. No acute intracranial infarct or other abnormality. 2. Mildly advanced cerebral atrophy for age. Electronically Signed   By: Jeannine Boga M.D.   On: 05/14/2021 22:53   US RENAL  Result Date: 05/15/2021 CLINICAL DATA:  Acute renal injury, superimposed on chronic renal disease. EXAM: RENAL / URINARY TRACT ULTRASOUND COMPLETE COMPARISON:  None. FINDINGS: Right Kidney:  Renal measurements: 10.6 cm x 4.2 cm x 3.6 cm = volume: 83.7 mL. Diffusely increased echogenicity of the renal parenchyma is noted. No mass or hydronephrosis visualized. Left Kidney: Renal measurements: 8.9 cm x 4.7 cm x 3.4 cm = volume: 73.8 mL. Diffusely increased echogenicity of the renal parenchyma is noted. A 2.3 cm x 1.5 cm x 1.4 cm anechoic structure is seen within the lower pole of the left kidney. No abnormal flow is noted within this region on color Doppler evaluation. No hydronephrosis is visualized. Bladder: Appears normal for degree of bladder distention. Other: Of incidental note is the presence of multiple large hyperechoic liver lesions. IMPRESSION: 1. Increased renal echogenicity, likely secondary to medical renal disease. 2. Simple renal cyst within the lower pole of the left kidney. 3. Large hyperechoic liver lesions. MRI correlation is recommended, as an underlying neoplastic process cannot be excluded. Electronically Signed   By: Virgina Norfolk M.D.   On: 05/15/2021 02:09       LOS: 0 days   [facility] Pager on [email or web address]  05/15/2021, 9:24 AM

## 2021-05-16 DIAGNOSIS — F319 Bipolar disorder, unspecified: Secondary | ICD-10-CM

## 2021-05-16 DIAGNOSIS — F05 Delirium due to known physiological condition: Secondary | ICD-10-CM

## 2021-05-16 DIAGNOSIS — C787 Secondary malignant neoplasm of liver and intrahepatic bile duct: Secondary | ICD-10-CM

## 2021-05-16 LAB — COMPREHENSIVE METABOLIC PANEL
ALT: 45 U/L — ABNORMAL HIGH (ref 0–44)
AST: 75 U/L — ABNORMAL HIGH (ref 15–41)
Albumin: 2.5 g/dL — ABNORMAL LOW (ref 3.5–5.0)
Alkaline Phosphatase: 144 U/L — ABNORMAL HIGH (ref 38–126)
Anion gap: 7 (ref 5–15)
BUN: 12 mg/dL (ref 8–23)
CO2: 24 mmol/L (ref 22–32)
Calcium: 8.6 mg/dL — ABNORMAL LOW (ref 8.9–10.3)
Chloride: 110 mmol/L (ref 98–111)
Creatinine, Ser: 1.93 mg/dL — ABNORMAL HIGH (ref 0.44–1.00)
GFR, Estimated: 29 mL/min — ABNORMAL LOW (ref >60)
Glucose, Bld: 129 mg/dL — ABNORMAL HIGH (ref 70–99)
Potassium: 3.6 mmol/L (ref 3.5–5.1)
Sodium: 141 mmol/L (ref 135–145)
Total Bilirubin: 0.6 mg/dL (ref 0.3–1.2)
Total Protein: 5.4 g/dL — ABNORMAL LOW (ref 6.5–8.1)

## 2021-05-16 LAB — CBC
HCT: 29.3 % — ABNORMAL LOW (ref 36.0–46.0)
Hemoglobin: 9.1 g/dL — ABNORMAL LOW (ref 12.0–15.0)
MCH: 27.2 pg (ref 26.0–34.0)
MCHC: 31.1 g/dL (ref 30.0–36.0)
MCV: 87.7 fL (ref 80.0–100.0)
Platelets: 226 10*3/uL (ref 150–400)
RBC: 3.34 MIL/uL — ABNORMAL LOW (ref 3.87–5.11)
RDW: 15.4 % (ref 11.5–15.5)
WBC: 6.8 10*3/uL (ref 4.0–10.5)
nRBC: 0 % (ref 0.0–0.2)

## 2021-05-16 MED ORDER — TAMSULOSIN HCL 0.4 MG PO CAPS
0.4000 mg | ORAL_CAPSULE | Freq: Every day | ORAL | Status: DC
  Administered 2021-05-16 – 2021-06-06 (×22): 0.4 mg via ORAL
  Filled 2021-05-16 (×22): qty 1

## 2021-05-16 MED ORDER — SODIUM CHLORIDE 0.9 % IV SOLN: INTRAVENOUS | Status: AC

## 2021-05-16 NOTE — Progress Notes (Addendum)
He was  [facility] PROGRESS NOTE   Megan Miranda NAT:[id number] DOB: 07/11/1960 DOA: 05/14/2021  PCP: Patient, No Pcp Per (Inactive)  Brief History/Interval Summary: 60 y.o. female with medical history significant for bipolar 1 disorder, schizoaffective disorder, CKD stage IIIb who presented to the ED for evaluation of generalized weakness/unstable gait.  Over a period of few days prior to admission she had been feeling weaker than usual.  Usually does not require a walker or any other assistive devices.  Found to have acute kidney injury.  MRI brain did not show any acute findings.  Patient was hospitalized for further management.  Reason for Visit: Acute kidney injury  Consultants: None  Procedures: None    Subjective/Interval History: Patient mentions that she slept well overnight.  He had to undergo an heart catheterization for urinary retention twice so far.  Denies any back pain.  Feels that she is a little bit stronger today compared to yesterday.    Assessment/Plan:  Acute kidney injury superimposed on chronic kidney disease stage IIIb Baseline creatinine appears to be between 1.4-1.6.  Appeared to be dehydrated on admission.  Creatinine was 2.68 on presentation.   Was given IV fluids.  Seems to have good urine output.  Creatinine is down to 1.9.  Avoid nephrotoxic agents.  Renal ultrasound did not show any hydronephrosis.    Urinary retention Has required in and out catheterization twice so far.  We will place her on Flomax.  Retention is likely due to immobility.  Continue to monitor.  Try to avoid Foley catheter as much as possible.    Liver lesions concerning for metastatic process abnormal LFTs MRI findings were discussed with the patient.  Unclear how much of what I said she understood but she was able to repeat the findings to me.  She also seem to understand that we are concerned about cancer but she tells me that she wants to pursue work-up only after the  holidays.  CEA is pending.  She will need to undergo CT scan of the chest abdomen pelvis to identify a primary.  She does not know if she has ever had a colonoscopy.  None found in the chart or on Care Everywhere.  Generalized weakness/unsteady gait MRI brain did not show any acute findings.  Patient denies any back pains.  Wonder if her weakness was due to worsening of her kidney function.   CK level was mildly elevated.  B12 levels 1110.  Folic acid normal.  TSH normal.  Does not appear to have any neurological deficits.  Denies any back pains.  PT and OT evaluation.  They do recommend skilled nursing facility for rehabilitation.    History of bipolar disorder/schizoaffective disorder Home medications been continued including Lamictal, Zyprexa and Ativan. She mentions that she lives in assisted living facility.  So was going to move into her own place was waiting till after the holidays.  She does not want me to communicate with her brother and instead wants me to talk to her attorneys but is unable to provide any contact information for same. Some of the statements that she makes does suggest that she has capacity but then she makes other statements and repeats herself which makes me question her capacity.  This is a somewhat complex scenario considering her underlying psychiatric illness.  We will request psychiatry to assist.  ADDENDUM It was pointed out to me that patient may have a legal guardian.  Discussed with Education officer, museum and we were able  to verify that patient was indeed declared incompetent by the courts in 2020 and the brother is the legal guardian.  Was able to discuss with brother and apprised him of the current clinical status.  Plan was outlined to him.  He understands the difficulty of the situation wrt further work-up for the liver lesions, especially if patient continues to decline further tests.  Normocytic anemia Drop in hemoglobin likely dilutional.  No evidence of overt  bleeding.  Anemia panel unremarkable for any deficiencies.  Left renal cyst Noted to be simple cyst on ultrasound.    DVT Prophylaxis: Subcutaneous heparin Code Status: Full code Family Communication: Discussed with patient.  She does not want [place] to talk to her brother.  Does not have any children.  She is divorced and does not have any contact with her ex-husband either. Disposition Plan: She is here from an assisted living facility.  SNF recommended by PT and OT.  Status is: Inpatient  Remains inpatient appropriate because: Acute kidney injury, lesions in the liver, unsafe discharge plan       Medications: Scheduled:  heparin  5,000 Units Subcutaneous Q8H   lamoTRIgine  200 mg Oral Daily   LORazepam  1 mg Oral BID   nicotine  14 mg Transdermal Daily   OLANZapine  10 mg Oral BID   Continuous:  sodium chloride 75 mL/hr at 05/16/21 0021   OXB:DZHGDJMEQASTM **OR** acetaminophen, ondansetron **OR** ondansetron (ZOFRAN) IV  Antibiotics: Anti-infectives (From admission, onward)    None       Objective:  Vital Signs  Vitals:   05/15/21 1800 05/15/21 1930 05/16/21 0421 05/16/21 0534  BP: 111/72 105/67 (!) 103/53   Pulse:  94 95   Resp:  16 20   Temp:  97.9 F (36.6 C) 98.6 F (37 C)   TempSrc:  Oral Oral   SpO2:  100% 100%   Weight:    53 kg  Height:        Intake/Output Summary (Last 24 hours) at 05/16/2021 0934 Last data filed at 05/16/2021 0600 Gross per 24 hour  Intake 1962.41 ml  Output 1512 ml  Net 450.41 ml   Filed Weights   05/14/21 1758 05/16/21 0534  Weight: 55.8 kg 53 kg    General appearance: Awake alert.  In no distress.  Slightly distracted Resp: Clear to auscultation bilaterally.  Normal effort Cardio: S1-S2 is normal regular.  No S3-S4.  No rubs murmurs or bruit GI: Abdomen is soft.  Nontender nondistended.  Bowel sounds are present normal.  No masses organomegaly Extremities: Improved mobility of the lower extremities  noted. Neurologic: Alert and oriented x3.  No focal neurological deficits.    Lab Results:  Data Reviewed: I have personally reviewed following labs and imaging studies  CBC: Recent Labs  Lab 05/14/21 1811 05/15/21 0520 05/16/21 0325  WBC 13.1* 9.6 6.8  NEUTROABS 10.5*  --   --   HGB 11.1* 9.9* 9.1*  HCT 35.2* 31.5* 29.3*  MCV 89.1 89.0 87.7  PLT 361 248 226     Basic Metabolic Panel: Recent Labs  Lab 05/14/21 1811 05/15/21 0520 05/16/21 0325  NA 137 135 141  K 3.8 3.5 3.6  CL 103 105 110  CO2 23 18* 24  GLUCOSE 143* 158* 129*  BUN 15 13 12   CREATININE 2.68* 2.14* 1.93*  CALCIUM 9.7 8.8* 8.6*     GFR: Estimated Creatinine Clearance: 25.6 mL/min (A) (by C-G formula based on SCr of 1.93 mg/dL (H)).  Liver  Function Tests: Recent Labs  Lab 05/14/21 1811 05/15/21 0520 05/16/21 0325  AST 104* 80* 75*  ALT 43 40 45*  ALKPHOS 150* 147* 144*  BILITOT 1.1 1.0 0.6  PROT 6.5 5.6* 5.4*  ALBUMIN 3.2* 2.8* 2.5*      Recent Results (from the past 240 hour(s))  Resp Panel by RT-PCR (Flu A&B, Covid) Nasopharyngeal Swab     Status: None   Collection Time: 05/15/21 12:10 AM   Specimen: Nasopharyngeal Swab; Nasopharyngeal(NP) swabs in vial transport medium  Result Value Ref Range Status   SARS Coronavirus 2 by RT PCR NEGATIVE NEGATIVE Final    Comment: (NOTE) SARS-CoV-2 target nucleic acids are NOT DETECTED.  The SARS-CoV-2 RNA is generally detectable in upper respiratory specimens during the acute phase of infection. The lowest concentration of SARS-CoV-2 viral copies this assay can detect is 138 copies/mL. A negative result does not preclude SARS-Cov-2 infection and should not be used as the sole basis for treatment or other patient management decisions. A negative result may occur with  improper specimen collection/handling, submission of specimen other than nasopharyngeal swab, presence of viral mutation(s) within the areas targeted by this assay, and  inadequate number of viral copies(<138 copies/mL). A negative result must be combined with clinical observations, patient history, and epidemiological information. The expected result is Negative.  Fact Sheet for Patients:  EntrepreneurPulse.com.au  Fact Sheet for Healthcare Providers:  IncredibleEmployment.be  This test is no t yet approved or cleared by the [place] FDA and  has been authorized for detection and/or diagnosis of SARS-CoV-2 by FDA under an Emergency Use Authorization (EUA). This EUA will remain  in effect (meaning this test can be used) for the duration of the COVID-19 declaration under Section 564(b)(1) of the Act, 21 U.S.C.section 360bbb-3(b)(1), unless the authorization is terminated  or revoked sooner.       Influenza A by PCR NEGATIVE NEGATIVE Final   Influenza B by PCR NEGATIVE NEGATIVE Final    Comment: (NOTE) The Xpert Xpress SARS-CoV-2/FLU/RSV plus assay is intended as an aid in the diagnosis of influenza from Nasopharyngeal swab specimens and should not be used as a sole basis for treatment. Nasal washings and aspirates are unacceptable for Xpert Xpress SARS-CoV-2/FLU/RSV testing.  Fact Sheet for Patients: EntrepreneurPulse.com.au  Fact Sheet for Healthcare Providers: IncredibleEmployment.be  This test is not yet approved or cleared by the [place] FDA and has been authorized for detection and/or diagnosis of SARS-CoV-2 by FDA under an Emergency Use Authorization (EUA). This EUA will remain in effect (meaning this test can be used) for the duration of the COVID-19 declaration under Section 564(b)(1) of the Act, 21 U.S.C. section 360bbb-3(b)(1), unless the authorization is terminated or revoked.  Performed at [facility], [place] [address], [place], [place] [address]        Radiology Studies: CT Head Wo Contrast  Result Date: 05/14/2021 CLINICAL DATA:   Head trauma, moderate-severe. Unsteady gait. Pt fell today, leg weakness EXAM: CT HEAD WITHOUT CONTRAST TECHNIQUE: Contiguous axial images were obtained from the base of the skull through the vertex without intravenous contrast. COMPARISON:  CT head 10/15/2017 BRAIN: BRAIN Prominence of the lateral ventricles may be related to central predominant atrophy, although a component of normal pressure/communicating hydrocephalus cannot be excluded. No evidence of large-territorial acute infarction. No parenchymal hemorrhage. No mass lesion. No extra-axial collection. No mass effect or midline shift. No hydrocephalus. Basilar cisterns are patent. Vascular: No hyperdense vessel. Skull: No acute fracture or focal lesion. Sinuses/Orbits:  Paranasal sinuses and mastoid air cells are clear. The orbits are unremarkable. Other: None. IMPRESSION: 1. No acute intracranial abnormality. 2. Prominence of the lateral ventricles may be related to central predominant atrophy, although a component of normal pressure/communicating hydrocephalus cannot be excluded. Electronically Signed   By: Iven Finn M.D.   On: 05/14/2021 21:05   MR BRAIN WO CONTRAST  Result Date: 05/14/2021 CLINICAL DATA:  Initial evaluation for neuro deficit, stroke suspected. CT locking glass date fetal enough locking read it dome locking read no all dysfunction read 5 deflecting look at soccer practice EXAM: MRI HEAD WITHOUT CONTRAST TECHNIQUE: Multiplanar, multiecho pulse sequences of the brain and surrounding structures were obtained without intravenous contrast. COMPARISON:  Prior CT from earlier the same day. FINDINGS: Brain: Mild diffuse prominence of the CSF containing spaces compatible with generalized cerebral atrophy, mildly advanced for age. No significant cerebral white matter disease. No abnormal foci of restricted diffusion to suggest acute or subacute ischemia. Gray-white matter differentiation maintained. No encephalomalacia to suggest chronic  cortical infarction. No evidence for acute or chronic intracranial hemorrhage. No mass lesion, midline shift or mass effect. Mild ventricular prominence related to global parenchymal volume loss without hydrocephalus. No extra-axial fluid collection. Pituitary gland suprasellar region within normal limits. Midline structures intact and normal. Vascular: Major intracranial vascular flow voids are maintained. Skull and upper cervical spine: Craniocervical junction within normal limits. Degenerative spondylosis noted at C4-5 and C5-6 with resultant mild to moderate spinal stenosis. Bone marrow signal intensity within normal limits. No scalp soft tissue abnormality. Sinuses/Orbits: Globes and orbital soft tissues within normal limits. Paranasal sinuses are largely clear. Trace right mastoid effusion, of doubtful significance. Inner ear structures grossly normal. Other: None. IMPRESSION: 1. No acute intracranial infarct or other abnormality. 2. Mildly advanced cerebral atrophy for age. Electronically Signed   By: Jeannine Boga M.D.   On: 05/14/2021 22:53   MR LIVER WO CONRTAST  Addendum Date: 05/15/2021   ADDENDUM REPORT: 05/15/2021 15:34 ADDENDUM: Mild increased T2 signal in RIGHT paraspinous musculature about the lower lumbar spine at lower margin of the images submitted is of uncertain significance potentially representing myositis or early atrophy. Correlate with any symptoms in this location. These results were called by telephone at the time of interpretation on 05/15/2021 at 3:34 pm to provider [facility] , who verbally acknowledged these results. Electronically Signed   By: Zetta Bills M.D.   On: 05/15/2021 15:34   Result Date: 05/15/2021 CLINICAL DATA:  Abnormal findings on previous renal sonogram. Suspected liver lesions in a 60 year old female. EXAM: MRI ABDOMEN WITHOUT CONTRAST TECHNIQUE: Multiplanar multisequence MR imaging was performed without the administration of intravenous  contrast. COMPARISON:  Renal sonogram of the same date. FINDINGS: Lower chest: No effusion. No consolidation at the lung bases. Limited assessment on MRI of this area. Hepatobiliary: Numerous large hepatic metastatic lesions, largest centered in the LEFT hepatic lobe measuring 9.2 x 6.0 cm with mixed T2 signal predominantly hypointense on T1. At least 9 additional lesions. Next largest in the dome of the RIGHT hemi liver measuring 8.3 x 7.0 cm (image 5/4) Four additional lesions which exceed 5 cm in the RIGHT hepatic lobe more inferiorly with smaller lesions elsewhere. There is both LEFT and RIGHT hepatic lobe involvement. No biliary duct distension. No gallbladder distension or pericholecystic fluid. Mild prominence of the intrahepatic biliary tree related to extrinsic compression secondary to periportal adenopathy. Pancreas: Normal intrinsic T1 signal. No ductal dilation or sign of inflammation. No focal lesion. Spleen: Normal  signal in the spleen with mild splenic enlargement approximately 13 cm greatest craniocaudal dimension. Adrenals/Urinary Tract: RIGHT adrenal gland with mild thickening contiguous with hepatic mass. Nodular thickening of the LEFT adrenal gland (image 19/2) this measures approximately 15 mm, suggestion of signal loss on out of phase T1 weighted gradient echo imaging. Mild ureteral and collecting system distension. This is seen bilaterally. Diffuse infiltration of the kidneys with small cystic lesions, diffuse nature of this process is compatible with lithium toxicity in the setting of reported bipolar disorder. Stomach/Bowel: No acute gastrointestinal process to the extent evaluated on this abdominal MRI, not well assessed. Vascular/Lymphatic: Bulky upper abdominal lymphadenopathy and periportal lymphadenopathy. (Image 15/3) 16 mm short axis lymph node in the hepatic gastric recess (Image 24/2) 17 mm periportal lymph node. (Image 26/2) 818 mm intra-aortocaval lymph node. Vascular structures  are not well assessed, normal caliber on the current study. Other:  Leiomyomas partially visualized in the uterus. Musculoskeletal: No suspicious bone lesions identified. IMPRESSION: Numerous bilateral hepatic metastatic lesions largest centered in the medial segment LEFT hepatic lobe. Bulky upper abdominal lymphadenopathy and periportal lymphadenopathy, also compatible with metastatic process. Adenopathy is confined mainly to the upper abdomen. Mild splenomegaly. Correlate with any history of risk factors for liver disease/cirrhosis. This could also be related to diffuse hepatic infiltration. Liver is diffusely abnormal but without overt signs of cirrhosis. Mild prominence of the intrahepatic biliary tree related to extrinsic compression secondary to periportal adenopathy. Findings compatible with lithium toxicity, multiple tiny cystic areas, as involving the bilateral kidneys in this patient with reported history of bipolar disorder. Mild collecting system distension and distension of bilateral ureters, correlate with any signs of urinary retention. Urinary bladder is distended extending into the lower abdomen. Electronically Signed: By: Zetta Bills M.D. On: 05/15/2021 14:20   US RENAL  Result Date: 05/15/2021 CLINICAL DATA:  Acute renal injury, superimposed on chronic renal disease. EXAM: RENAL / URINARY TRACT ULTRASOUND COMPLETE COMPARISON:  None. FINDINGS: Right Kidney: Renal measurements: 10.6 cm x 4.2 cm x 3.6 cm = volume: 83.7 mL. Diffusely increased echogenicity of the renal parenchyma is noted. No mass or hydronephrosis visualized. Left Kidney: Renal measurements: 8.9 cm x 4.7 cm x 3.4 cm = volume: 73.8 mL. Diffusely increased echogenicity of the renal parenchyma is noted. A 2.3 cm x 1.5 cm x 1.4 cm anechoic structure is seen within the lower pole of the left kidney. No abnormal flow is noted within this region on color Doppler evaluation. No hydronephrosis is visualized. Bladder: Appears normal  for degree of bladder distention. Other: Of incidental note is the presence of multiple large hyperechoic liver lesions. IMPRESSION: 1. Increased renal echogenicity, likely secondary to medical renal disease. 2. Simple renal cyst within the lower pole of the left kidney. 3. Large hyperechoic liver lesions. MRI correlation is recommended, as an underlying neoplastic process cannot be excluded. Electronically Signed   By: Virgina Norfolk M.D.   On: 05/15/2021 02:09       LOS: 1 day   [facility] Pager on [email or web address]  05/16/2021, 9:34 AM

## 2021-05-16 NOTE — Consult Note (Signed)
[facility] Face-to-Face Psychiatry Consult   Reason for Consult:  medication adjustment Referring Physician:  Bonnielee Haff, MD Patient Identification: Megan Miranda MRN:  [id number] Principal Diagnosis: Delirium due to another medical condition Diagnosis:  Principal Problem:   Delirium due to another medical condition Active Problems:   Bipolar I disorder ([place])   Acute kidney injury superimposed on chronic kidney disease ([place])   Generalized weakness   AKI (acute kidney injury) ([place])   Total Time spent with patient:  30 minutes  Subjective:   Megan Miranda is a 60 y.o. female with history of schizoaffective disorder, CKD stage IIIb, who presents to the ED for evaluation of generalized weakness/unsteady gait.  She is currently treated for AGI superimposed on CKD, urinary retention.  Noted that she was found to have liver lesions concerning for metastatsis with abnormal LFTs.  Psychiatry is consulted for medication adjustment due to concern from her brother, who reported "more psychotic" at her ALF for the last two weeks. (Noted that although we were originally consulted for capacity evaluation for discharge to home, it was found out that the patient has POA, her brother, and the court declared that she is incompetent in 2020 according to the primary team).      HPI:   -Discussed with her POA , Megan Miranda,Megan Miranda (Brother)  He states that he does not meet with her as much as she does not do well with him.  However, he heard from the staff at the facility that she is not taking medication.  He is also concerned that she is writing in walls, and has decreased appetite, weakness, and withdrawing herself in the room.   - Discussed with the staff at [facility] assisted Living facility.  She has been taking medication regularly.  Her home medication is listed as below.  Staff denies any concern except that she appears to be weak and has unsteady gait, although she used to be able to walk around with a  walker.  She usually engages well, talks well with other people.  Although she does talk about going to leave the facility, she does not attempted.  She appears to have AH, and talking to herself at times, and being in her room.  However, the staff denies any aggression, or any other behavior concerns.  Medication: olanzapine 10 mg BID, lamotrigine 200 mg daily, lorazepam 1 mg twice a day (lithium is not on the list according to the staff)  -Discussed with the nurse.  She appears to be confused, and she states that she is in an attorney.  She has been eating majority of the food.  There is no behavior concern such as trying to leave, pulling lines nor any aggressive behaviors.   The patient is interviewed at the bedside.  She states that she has been doing well.  When this Probation officer introduced self as a Teacher, music, she states that she has been seeing a psychiatrist at least once a month.  She likes this psychiatrist, and feels comfortable continuing her medication.  However, she states that she "refuse medical care."  She is unable to elaborate the details in terms of why nor which medical care she is referring to.  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  Although she acknowledges that she lives in assisted living facility, she would like to go to other place as she does not want to be around with other residents, who are older than her. She then perseverates on not  getting medical care.   She denies feeling depressed or anxiety.  She sleeps well.  Although she reports good appetite, she has not eaten lunch at the bedside .  She states that there are certain food that she prefers.  She denies AH, VH, paranoia.  She denies ideas of reference.  She denies thought insertion.   CAM-ICU positive  Head MRI on 04/2021 IMPRESSION: 1. No acute intracranial infarct or other abnormality. 2. Mildly advanced cerebral atrophy for age.   Past Psychiatric History:  Outpatient: used to  seen by Dr. Casimiro Needle, last in 2019. Reported history of bipolar disorder Psychiatry admission: "multiple times" per Dr. Karen Chafe note Previous suicide attempt: denies Past trials of medication: abilify, fluphenazine, lithium,  History of violence:  denies  Risk to Self:  moderate due to delirium Risk to Others:  low Prior Inpatient Therapy:   Prior Outpatient Therapy:    Past Medical History:  Past Medical History:  Diagnosis Date   Bipolar disorder ([place])    Schizoaffective disorder ([place])    No past surgical history on file. Family History: No family history on file. Family Psychiatric  History: denies Social History:  Social History   Substance and Sexual Activity  Alcohol Use No     Social History   Substance and Sexual Activity  Drug Use No    Social History   Socioeconomic History   Marital status: Married    Spouse name: Not on file   Number of children: Not on file   Years of education: Not on file   Highest education level: Not on file  Occupational History   Not on file  Tobacco Use   Smoking status: Some Days    Packs/day: 0.10    Years: 2.00    Pack years: 0.20    Types: Cigarettes   Smokeless tobacco: Never   Tobacco comments:    Smokes a few cigarettes a week.   Vaping Use   Vaping Use: Never used  Substance and Sexual Activity   Alcohol use: No   Drug use: No   Sexual activity: Never  Other Topics Concern   Not on file  Social History Narrative   Not on file   Social Determinants of Health   Financial Resource Strain: Not on file  Food Insecurity: Not on file  Transportation Needs: Not on file  Physical Activity: Not on file  Stress: Not on file  Social Connections: Not on file   Additional Social History:    Allergies:   Allergies  Allergen Reactions   Bee Venom Itching   Soap Rash    Labs:  Results for orders placed or performed during the hospital encounter of 05/14/21 (from the past 48 hour(s))  CBC with Differential      Status: Abnormal   Collection Time: 05/14/21  6:11 PM  Result Value Ref Range   WBC 13.1 (H) 4.0 - 10.5 K/uL   RBC 3.95 3.87 - 5.11 MIL/uL   Hemoglobin 11.1 (L) 12.0 - 15.0 g/dL   HCT 35.2 (L) 36.0 - 46.0 %   MCV 89.1 80.0 - 100.0 fL   MCH 28.1 26.0 - 34.0 pg   MCHC 31.5 30.0 - 36.0 g/dL   RDW 15.0 11.5 - 15.5 %   Platelets 361 150 - 400 K/uL   nRBC 0.0 0.0 - 0.2 %   Neutrophils Relative % 80 %   Neutro Abs 10.5 (H) 1.7 - 7.7 K/uL   Lymphocytes Relative 9 %   Lymphs  Abs 1.2 0.7 - 4.0 K/uL   Monocytes Relative 10 %   Monocytes Absolute 1.3 (H) 0.1 - 1.0 K/uL   Eosinophils Relative 0 %   Eosinophils Absolute 0.0 0.0 - 0.5 K/uL   Basophils Relative 0 %   Basophils Absolute 0.0 0.0 - 0.1 K/uL   Immature Granulocytes 1 %   Abs Immature Granulocytes 0.07 0.00 - 0.07 K/uL    Comment: Performed at [facility], [place] [address], [place], [place] [address]  Comprehensive metabolic panel     Status: Abnormal   Collection Time: 05/14/21  6:11 PM  Result Value Ref Range   Sodium 137 135 - 145 mmol/L   Potassium 3.8 3.5 - 5.1 mmol/L   Chloride 103 98 - 111 mmol/L   CO2 23 22 - 32 mmol/L   Glucose, Bld 143 (H) 70 - 99 mg/dL    Comment: Glucose reference range applies only to samples taken after fasting for at least 8 hours.   BUN 15 8 - 23 mg/dL   Creatinine, Ser 2.68 (H) 0.44 - 1.00 mg/dL   Calcium 9.7 8.9 - 10.3 mg/dL   Total Protein 6.5 6.5 - 8.1 g/dL   Albumin 3.2 (L) 3.5 - 5.0 g/dL   AST 104 (H) 15 - 41 U/L   ALT 43 0 - 44 U/L   Alkaline Phosphatase 150 (H) 38 - 126 U/L   Total Bilirubin 1.1 0.3 - 1.2 mg/dL   GFR, Estimated 20 (L) >60 mL/min    Comment: (NOTE) Calculated using the CKD-EPI Creatinine Equation (2021)    Anion gap 11 5 - 15    Comment: Performed at [facility], [place] [address], [place], [place] [address]  Urinalysis, Routine w reflex microscopic Urine, Clean Catch     Status: Abnormal   Collection Time: 05/14/21  6:11 PM  Result Value Ref Range    Color, Urine YELLOW YELLOW   APPearance CLEAR CLEAR   Specific Gravity, Urine 1.003 (L) 1.005 - 1.030   pH 6.0 5.0 - 8.0   Glucose, UA NEGATIVE NEGATIVE mg/dL   Hgb urine dipstick SMALL (A) NEGATIVE   Bilirubin Urine NEGATIVE NEGATIVE   Ketones, ur NEGATIVE NEGATIVE mg/dL   Protein, ur NEGATIVE NEGATIVE mg/dL   Nitrite NEGATIVE NEGATIVE   Leukocytes,Ua TRACE (A) NEGATIVE   RBC / HPF 0-5 0 - 5 RBC/hpf   WBC, UA 0-5 0 - 5 WBC/hpf   Bacteria, UA RARE (A) NONE SEEN   Squamous Epithelial / LPF 0-5 0 - 5    Comment: Performed at [facility], [place] [address] [place], [place] [address]  Resp Panel by RT-PCR (Flu A&B, Covid) Nasopharyngeal Swab     Status: None   Collection Time: 05/15/21 12:10 AM   Specimen: Nasopharyngeal Swab; Nasopharyngeal(NP) swabs in vial transport medium  Result Value Ref Range   SARS Coronavirus 2 by RT PCR NEGATIVE NEGATIVE    Comment: (NOTE) SARS-CoV-2 target nucleic acids are NOT DETECTED.  The SARS-CoV-2 RNA is generally detectable in upper respiratory specimens during the acute phase of infection. The lowest concentration of SARS-CoV-2 viral copies this assay can detect is 138 copies/mL. A negative result does not preclude SARS-Cov-2 infection and should not be used as the sole basis for treatment or other patient management decisions. A negative result may occur with  improper specimen collection/handling, submission of specimen other than nasopharyngeal swab, presence of viral mutation(s) within the areas targeted by this assay, and inadequate number of  viral copies(<138 copies/mL). A negative result must be combined with clinical observations, patient history, and epidemiological information. The expected result is Negative.  Fact Sheet for Patients:  EntrepreneurPulse.com.au  Fact Sheet for Healthcare Providers:  IncredibleEmployment.be  This test is no t yet approved or cleared by the [place] FDA and   has been authorized for detection and/or diagnosis of SARS-CoV-2 by FDA under an Emergency Use Authorization (EUA). This EUA will remain  in effect (meaning this test can be used) for the duration of the COVID-19 declaration under Section 564(b)(1) of the Act, 21 U.S.C.section 360bbb-3(b)(1), unless the authorization is terminated  or revoked sooner.       Influenza A by PCR NEGATIVE NEGATIVE   Influenza B by PCR NEGATIVE NEGATIVE    Comment: (NOTE) The Xpert Xpress SARS-CoV-2/FLU/RSV plus assay is intended as an aid in the diagnosis of influenza from Nasopharyngeal swab specimens and should not be used as a sole basis for treatment. Nasal washings and aspirates are unacceptable for Xpert Xpress SARS-CoV-2/FLU/RSV testing.  Fact Sheet for Patients: EntrepreneurPulse.com.au  Fact Sheet for Healthcare Providers: IncredibleEmployment.be  This test is not yet approved or cleared by the [place] FDA and has been authorized for detection and/or diagnosis of SARS-CoV-2 by FDA under an Emergency Use Authorization (EUA). This EUA will remain in effect (meaning this test can be used) for the duration of the COVID-19 declaration under Section 564(b)(1) of the Act, 21 U.S.C. section 360bbb-3(b)(1), unless the authorization is terminated or revoked.  Performed at [facility], [place] [address], [place], [place] [address]   HIV Antibody (routine testing w rflx)     Status: None   Collection Time: 05/15/21  5:20 AM  Result Value Ref Range   HIV Screen 4th Generation wRfx Non Reactive Non Reactive    Comment: Performed at [facility], [place] [address] [place], [place] [address]  Comprehensive metabolic panel     Status: Abnormal   Collection Time: 05/15/21  5:20 AM  Result Value Ref Range   Sodium 135 135 - 145 mmol/L   Potassium 3.5 3.5 - 5.1 mmol/L   Chloride 105 98 - 111 mmol/L   CO2 18 (L) 22 - 32 mmol/L   Glucose, Bld 158 (H) 70  - 99 mg/dL    Comment: Glucose reference range applies only to samples taken after fasting for at least 8 hours.   BUN 13 8 - 23 mg/dL   Creatinine, Ser 2.14 (H) 0.44 - 1.00 mg/dL   Calcium 8.8 (L) 8.9 - 10.3 mg/dL   Total Protein 5.6 (L) 6.5 - 8.1 g/dL   Albumin 2.8 (L) 3.5 - 5.0 g/dL   AST 80 (H) 15 - 41 U/L   ALT 40 0 - 44 U/L   Alkaline Phosphatase 147 (H) 38 - 126 U/L   Total Bilirubin 1.0 0.3 - 1.2 mg/dL   GFR, Estimated 26 (L) >60 mL/min    Comment: (NOTE) Calculated using the CKD-EPI Creatinine Equation (2021)    Anion gap 12 5 - 15    Comment: Performed at [facility], [place] [address] [place] [address]  CBC     Status: Abnormal   Collection Time: 05/15/21  5:20 AM  Result Value Ref Range   WBC 9.6 4.0 - 10.5 K/uL   RBC 3.54 (L) 3.87 - 5.11 MIL/uL   Hemoglobin 9.9 (L) 12.0 - 15.0 g/dL   HCT 31.5 (L) 36.0 - 46.0 %   MCV  89.0 80.0 - 100.0 fL   MCH 28.0 26.0 - 34.0 pg   MCHC 31.4 30.0 - 36.0 g/dL   RDW 15.0 11.5 - 15.5 %   Platelets 248 150 - 400 K/uL   nRBC 0.0 0.0 - 0.2 %    Comment: Performed at [place] [address], [place], [place] [address]  TSH     Status: None   Collection Time: 05/15/21  9:52 AM  Result Value Ref Range   TSH 1.106 0.350 - 4.500 uIU/mL    Comment: Performed by a 3rd Generation assay with a functional sensitivity of <=0.01 uIU/mL. Performed at [facility], [place] [address], [place], [place] [address]   Vitamin B12     Status: Abnormal   Collection Time: 05/15/21  9:52 AM  Result Value Ref Range   Vitamin B-12 1,110 (H) 180 - 914 pg/mL    Comment: (NOTE) This assay is not validated for testing neonatal or myeloproliferative syndrome specimens for Vitamin B12 levels. Performed at [facility], [place] [address] [place], [place] [address]   Folate     Status: None   Collection Time: 05/15/21  9:52 AM  Result Value Ref Range   Folate 35.8 >5.9 ng/mL    Comment: Performed at [facility] [address] [place], [place] [address]  Iron and TIBC     Status: Abnormal   Collection Time: 05/15/21  9:52 AM  Result Value Ref Range   Iron 27 (L) 28 - 170 ug/dL   TIBC 213 (L) 250 - 450 ug/dL   Saturation Ratios 13 10.4 - 31.8 %   UIBC 186 ug/dL    Comment: Performed at [facility], [place] [address], [place], [place] [address]  Ferritin     Status: Abnormal   Collection Time: 05/15/21  9:52 AM  Result Value Ref Range   Ferritin 360 (H) 11 - 307 ng/mL    Comment: Performed at [facility], [place] [address] [place], [place] [address]  Reticulocytes     Status: Abnormal   Collection Time: 05/15/21  9:52 AM  Result Value Ref Range   Retic Ct Pct 1.6 0.4 - 3.1 %   RBC. 3.80 (L) 3.87 - 5.11 MIL/uL   Retic Count, Absolute 59.7 19.0 - 186.0 K/uL   Immature Retic Fract 16.9 (H) 2.3 - 15.9 %    Comment: Performed at [place] [address], [place], [place] [address]  CK     Status: Abnormal   Collection Time: 05/15/21  9:52 AM  Result Value Ref Range   Total CK 630 (H) 38 - 234 U/L    Comment: Performed at [facility], [place] [address] [place], [place] [address]  Hepatitis panel, acute     Status: None   Collection Time: 05/15/21  9:52 AM  Result Value Ref Range   Hepatitis B Surface Ag NON REACTIVE NON REACTIVE   HCV Ab NON REACTIVE NON REACTIVE    Comment: (NOTE) Nonreactive HCV antibody screen is consistent with no HCV infections,  unless recent infection is suspected or other evidence exists to indicate HCV infection.     Hep A IgM NON REACTIVE NON REACTIVE   Hep B C IgM NON REACTIVE NON REACTIVE    Comment: Performed at [facility], [place] [address] [place], [place] [address]  CBC     Status: Abnormal   Collection Time: 05/16/21  3:25 AM  Result Value Ref Range   WBC 6.8 4.0 - 10.5 K/uL   RBC 3.34 (L) 3.87 - 5.11 MIL/uL   Hemoglobin 9.1 (L) 12.0 - 15.0 g/dL   HCT 29.3 (L) 36.0 - 46.0 %   MCV 87.7 80.0 - 100.0 fL   MCH 27.2 26.0 - 34.0  pg   MCHC 31.1 30.0 - 36.0 g/dL   RDW 15.4 11.5 - 15.5 %   Platelets 226 150 - 400 K/uL   nRBC 0.0 0.0 - 0.2 %    Comment: Performed at [facility], [place] [address], [place], [place] [address]  Comprehensive metabolic panel     Status: Abnormal   Collection Time: 05/16/21  3:25 AM  Result Value Ref Range   Sodium 141 135 - 145 mmol/L   Potassium 3.6 3.5 - 5.1 mmol/L   Chloride 110 98 - 111 mmol/L   CO2 24 22 - 32 mmol/L   Glucose, Bld 129 (H) 70 - 99 mg/dL    Comment: Glucose reference range applies only to samples taken after fasting for at least 8 hours.   BUN 12 8 - 23 mg/dL   Creatinine, Ser 1.93 (H) 0.44 - 1.00 mg/dL   Calcium 8.6 (L) 8.9 - 10.3 mg/dL   Total Protein 5.4 (L) 6.5 - 8.1 g/dL   Albumin 2.5 (L) 3.5 - 5.0 g/dL   AST 75 (H) 15 - 41 U/L   ALT 45 (H) 0 - 44 U/L   Alkaline Phosphatase 144 (H) 38 - 126 U/L   Total Bilirubin 0.6 0.3 - 1.2 mg/dL   GFR, Estimated 29 (L) >60 mL/min    Comment: (NOTE) Calculated using the CKD-EPI Creatinine Equation (2021)    Anion gap 7 5 - 15    Comment: Performed at [facility], [place] [address], [place], [place] [address]    Current Facility-Administered Medications  Medication Dose Route Frequency Provider Last Rate Last Admin   0.9 %  sodium chloride infusion   Intravenous Continuous Bonnielee Haff, MD 75 mL/hr at 05/16/21 1101 New Bag at 05/16/21 1101   acetaminophen (TYLENOL) tablet 650 mg  650 mg Oral Q6H PRN Lenore Cordia, MD   650 mg at 05/16/21 0557   Or   acetaminophen (TYLENOL) suppository 650 mg  650 mg Rectal Q6H PRN Lenore Cordia, MD       heparin injection 5,000 Units  5,000 Units Subcutaneous Q8H Zada Finders R, MD   5,000 Units at 05/16/21 0557   lamoTRIgine (LAMICTAL) tablet 200 mg  200 mg Oral Daily Zada Finders R, MD   200 mg at 05/16/21 0914   LORazepam (ATIVAN) tablet 1 mg  1 mg Oral BID Zada Finders R, MD   1 mg at 05/16/21 6073   nicotine (NICODERM CQ - dosed in mg/24 hours) patch 14 mg  14  mg Transdermal Daily Bonnielee Haff, MD   14 mg at 05/16/21 0918   OLANZapine (ZYPREXA) tablet 10 mg  10 mg Oral BID Zada Finders R, MD   10 mg at 05/16/21 0915   ondansetron (ZOFRAN) tablet 4 mg  4 mg Oral Q6H PRN Lenore Cordia, MD       Or   ondansetron (ZOFRAN) injection 4 mg  4 mg Intravenous Q6H PRN Lenore Cordia, MD       tamsulosin (FLOMAX) capsule 0.4 mg  0.4 mg Oral Daily Bonnielee Haff, MD   0.4 mg at 05/16/21 1103  Musculoskeletal: Strength & Muscle Tone: decreased Gait & Station:  n/a, lying in the bed Patient leans: N/A            Psychiatric Specialty Exam:  Presentation  General Appearance: Fairly Groomed Eye Contact:Fair Speech:Normal Rate Speech Volume:Normal Handedness:No data recorded  Mood and Affect  Mood:-- (fine) Affect:Restricted  Thought Process  Thought Processes:Coherent Descriptions of Associations:Loose  Orientation:Full (Time, Place and Person)  Thought Content:Other (comment)  History of Schizophrenia/Schizoaffective disorder:No data recorded + details as above Duration of Psychotic Symptoms:No data recorded many years Hallucinations:Hallucinations: None  Ideas of Reference:None  Suicidal Thoughts:Suicidal Thoughts: No  Homicidal Thoughts:Homicidal Thoughts: No   Sensorium  Memory:Immediate Poor Judgment:Impaired Insight:Lacking  Executive Functions  Concentration:Poor Attention Span:Poor [place]  Psychomotor Activity  Psychomotor Activity:Psychomotor Activity: Decreased  Assets  Assets:Social Support  Sleep  Sleep:Sleep: Good  Physical Exam: Physical Exam Vitals and nursing note reviewed.   Review of Systems  All other systems reviewed and are negative. Blood pressure 96/64, pulse 80, temperature 98.4 F (36.9 C), temperature source Oral, resp. rate 18, height 5\' 5"  (1.651 m), weight 53 kg, SpO2 98 %. Body mass index is 19.44 kg/m.  Treatment Plan  Summary: Megan Miranda is a 60 y.o. female with history of schizoaffective disorder, CKD stage IIIb, who presents to the ED for evaluation of generalized weakness/unsteady gait.  She is currently treated for AKI superimposed on CKD, urinary retention.  Noted that she was found to have liver lesions concerning for metastatsis with abnormal LFTs.  Psychiatry is consulted for medication adjustment due to concern from her brother, who reported "more psychotic" at her ALF for the last two weeks.   # Delirium # Schizoaffective disorder Exams is notable for slightly slowed psychomotor activity, and she demonstrates inattention, disorganization with altered mental status that appear different than her baseline, suggesting a diagnosis of delirium.  Etiology of delirium includes but not limited to advanced age, psychiatric illness, AKI, urinary retention. Although she does have underlying psychotic disorder which plays into her current disorganization, the staff at ALF is not too much concerned about her behavior issues except her recent weakness, although they do acknowledge that she may be responding to internal stimuli.  Will continue her home medication at this time given her behavioral issues reported by her brother (writing in walls) may be more attributable to delirium and also to avoid any potential risks from medication. Will plan to reassess in a few days when delirium resolves and/or to evaluate if there is any change in her mental status. Will consider uptitration of olanzapine in the future if she displays any psychotic symptoms which is interfering with her medical care. Noted that although her POA is interested in starting LAI with the thought that she is non adherent to the medication, she has been taking medication regularly according to the staff at ALF. Will continue to assess   Plan -Continue olanzapine 10 mg twice a day (EKG 447 msec on 12/21) -Continue lamotrigine 200 mg daily -Continue  lorazepam 1 mg twice a day (if any worsening in delirium, would advise to lower the dose given it is deliriogenic) -Consider olanzapine 2.5 mg PO bid prn for severe agitation - Continue to monitor and treat underlying medical causes of delirium, including infection, electrolyte disturbances, etc. - Delirium precautions - Minimize/avoid deliriogenic meds including: anticholinergic, opiates, benzodiazepines           - Maintain hydration, oxygenation, nutrition           -  Limit use of restraints and catheters           - Normalize sleep patterns by minimizing nighttime noise, light and interruptions by                clustering care, opening blinds during the day           - Reorient the patient frequently, provide easily visible clock and calendar           - Provide sensory aids like glasses, hearing aids           - Encourage ambulation, regular activities and visitors to maintain cognitive stimulation   - The court declared that she is incompetent in 2020, and her brother is her POA  Disposition: Patient does not meet criteria for psychiatric inpatient admission.  Thank you for your consult. We will follow up the patient.   Norman Clay, MD 05/16/2021 2:34 PM

## 2021-05-16 NOTE — TOC Initial Note (Signed)
Transition of Care [facility]) - Initial/Assessment Note    Patient Details  Name: Megan Miranda MRN: [id number] Date of Birth: [birth year]  Transition of Care [facility]) CM/SW Contact:    Benard Halsted, LCSW Phone Number: 05/16/2021, 12:01 PM  Clinical Narrative:                 CSW received consult for possible SNF placement at time of discharge. CSW spoke with patient's brother who confirmed that he is patient's legal guardian, which he obtained from the court about two years ago. Patient resides at [facility] but has recently been reported to not be going to meals at the facility. Patient's brother expressed understanding of PT recommendation and is agreeable to SNF placement at time of discharge. CSW discussed insurance authorization process and will provide Medicare SNF ratings list.   Skilled Nursing Rehab Facilities-   [email or web address]   Ratings out of 5 possible   Name Address  Phone # [place] Inspection Overall  [facility] [address] [place] 5 5 2 4   [facility]  [address] [place], [place] [phone] 4 2 5 5   [facility] [place], [place] 4 1 1 1   [place], [place] 2 2 4 4   [facility] [address] [place] 2 1 1 1   [place] 3 1 4 3   [facility] [address], [place] 5 2 2 3   [facility] [address] [place] 4 1 2 1   [facility] [place] 5 1 2 2   [facility] [phone], [place] [phone] 4 1 1 1   [facility] [address], [facility] [phone] 4 1 2 1   [place] [phone] 4 1 1 1       Expected Discharge Plan: [place] Barriers to Discharge: Continued Medical Work up, Ship broker, SNF Pending bed offer   Patient Goals and CMS Choice Patient states their goals for this hospitalization and ongoing recovery are:: Rehab CMS Medicare.gov Compare Post Acute Care list provided to:: Patient Represenative (must comment) Choice offered to / list presented to : [facility] POA / Guardian, Sibling (Brother)  Expected Discharge Plan and Services Expected Discharge Plan: [place] In-house Referral: Clinical Social Work   Post Acute Care Choice: [place] Living arrangements for the past 2 months: [place]                                      Prior Living Arrangements/Services Living arrangements for the past 2 months: [place] Lives with:: Facility Resident Patient language and need for interpreter reviewed:: Yes Do you feel safe going back to the place where you live?: Yes      Need for Family Participation in Patient Care: Yes (Comment) Care giver support system in place?: Yes (comment)   Criminal Activity/Legal Involvement Pertinent to Current Situation/Hospitalization: No - Comment as needed  Activities of Daily Living Home Assistive Devices/Equipment: Bedside commode/3-in-1 ADL Screening (condition at time of admission) Patient's cognitive ability adequate to safely complete daily activities?: No Is the patient deaf or have difficulty hearing?: No Does the patient have difficulty seeing, even when wearing glasses/contacts?: No Does  the patient have difficulty concentrating, remembering, or making decisions?: No Patient able to express need for assistance with ADLs?: Yes Does the patient have difficulty dressing or bathing?: No Independently performs ADLs?: Yes (appropriate for developmental age) Does the patient have difficulty walking or climbing stairs?: No Weakness of Legs: None Weakness of Arms/Hands:  None  Permission Sought/Granted Permission sought to share information with : Facility Sport and exercise psychologist, Family Supports Permission granted to share information with : Yes, Verbal Permission Granted  Share Information with NAME: [name]  Permission granted to share info w AGENCY: [place] granted to share info w Relationship: Brother/Legal Guardian  Permission granted to share info w Contact Information: [phone]  Emotional Assessment Appearance:: Appears stated age     Orientation: : Oriented to Self, Oriented to Place, Oriented to  Time, Oriented to Situation Alcohol / Substance Use: Not Applicable Psych Involvement: No (comment)  Admission diagnosis:  Unsteady gait [R26.81] AKI (acute kidney injury) ([place]) [N17.9] Acute kidney injury superimposed on chronic kidney disease ([place]) [N17.9, N18.9] Patient Active Problem List   Diagnosis Date Noted   Generalized weakness 05/15/2021   AKI (acute kidney injury) ([place]) 05/15/2021   Acute kidney injury superimposed on chronic kidney disease ([place]) 05/14/2021   Schizoaffective disorder, manic type ([place]) 12/03/2018   SIRS (systemic inflammatory response syndrome) ([place]) 10/16/2017   Lactic acidosis 10/16/2017   Abnormal TSH 10/16/2017   History of alcohol abuse 10/16/2017   Lithium toxicity 10/15/2017   Bipolar I disorder ([place]) 07/21/2007   PCP:  Patient, No Pcp Per (Inactive) Pharmacy:   [facility] [place], [place] AT [place] [address] Phone: [phone] Fax: [phone]     Social Determinants of Health (SDOH) Interventions    Readmission Risk Interventions No flowsheet data found.

## 2021-05-16 NOTE — Progress Notes (Addendum)
RE: Megan Miranda Date of Birth: 12-Oct-2060 Date:  05/16/21  Please be advised that the above-named patient will require a short-term nursing home stay - anticipated 30 days or less for rehabilitation and strengthening.  The plan is for return home.

## 2021-05-16 NOTE — NC FL2 (Addendum)
[place] LEVEL OF CARE SCREENING TOOL     IDENTIFICATION  Patient Name: Megan Miranda Birthdate: [birth year] Sex: female Admission Date (Current Location): 05/14/2021  [facility] and [place] Number:  Herbalist and Address:  [facility], [place] [address] [place], [place] [address]      Provider Number: [id number]  Attending Physician Name and Address:  Bonnielee Haff, MD  Relative Name and Phone Number:       Current Level of Care: Hospital Recommended Level of Care: [place] Prior Approval Number:    Date Approved/Denied:   PASRR Number: [id number]  Discharge Plan: SNF    Current Diagnoses: Patient Active Problem List   Diagnosis Date Noted   Generalized weakness 05/15/2021   AKI (acute kidney injury) ([place]) 05/15/2021   Acute kidney injury superimposed on chronic kidney disease ([place]) 05/14/2021   Schizoaffective disorder, manic type ([place]) 12/03/2018   SIRS (systemic inflammatory response syndrome) ([place]) 10/16/2017   Lactic acidosis 10/16/2017   Abnormal TSH 10/16/2017   History of alcohol abuse 10/16/2017   Lithium toxicity 10/15/2017   Bipolar I disorder ([place]) 07/21/2007    Orientation RESPIRATION BLADDER Height & Weight     Self, Time, Situation, Place  Normal Incontinent, External catheter Weight: 116 lb 13.5 oz (53 kg) Height:  5\' 5"  (165.1 cm)  BEHAVIORAL SYMPTOMS/MOOD NEUROLOGICAL BOWEL NUTRITION STATUS      Continent Diet (see dc summary)  AMBULATORY STATUS COMMUNICATION OF NEEDS Skin   Extensive Assist Verbally Normal                       Personal Care Assistance Level of Assistance  Bathing, Feeding, Dressing Bathing Assistance: Limited assistance Feeding assistance: Limited assistance Dressing Assistance: Limited assistance     Functional Limitations Info             [place]  PT (By licensed PT), OT (By licensed OT)     PT Frequency: 5x/week OT  Frequency: 5x/week            Contractures Contractures Info: Not present    Additional Factors Info  Code Status, Allergies, Psychotropic Code Status Info: Full Allergies Info: Bee Venom, Soap Psychotropic Info: Lamictal; Ativan         Current Medications (05/16/2021):  This is the current hospital active medication list Current Facility-Administered Medications  Medication Dose Route Frequency Provider Last Rate Last Admin   0.9 %  sodium chloride infusion   Intravenous Continuous Bonnielee Haff, MD 75 mL/hr at 05/16/21 1101 New Bag at 05/16/21 1101   acetaminophen (TYLENOL) tablet 650 mg  650 mg Oral Q6H PRN Lenore Cordia, MD   650 mg at 05/16/21 0557   Or   acetaminophen (TYLENOL) suppository 650 mg  650 mg Rectal Q6H PRN Lenore Cordia, MD       heparin injection 5,000 Units  5,000 Units Subcutaneous Q8H Zada Finders R, MD   5,000 Units at 05/16/21 0557   lamoTRIgine (LAMICTAL) tablet 200 mg  200 mg Oral Daily Zada Finders R, MD   200 mg at 05/16/21 0914   LORazepam (ATIVAN) tablet 1 mg  1 mg Oral BID Zada Finders R, MD   1 mg at 05/16/21 3845   nicotine (NICODERM CQ - dosed in mg/24 hours) patch 14 mg  14 mg Transdermal Daily Bonnielee Haff, MD   14 mg at 05/16/21 0918   OLANZapine (ZYPREXA) tablet 10 mg  10 mg Oral BID Zada Finders R, MD   10 mg at 05/16/21 0915   ondansetron (ZOFRAN) tablet 4 mg  4 mg Oral Q6H PRN Lenore Cordia, MD       Or   ondansetron [facility]) injection 4 mg  4 mg Intravenous Q6H PRN Lenore Cordia, MD       tamsulosin (FLOMAX) capsule 0.4 mg  0.4 mg Oral Daily Bonnielee Haff, MD   0.4 mg at 05/16/21 1103     Discharge Medications: Please see discharge summary for a list of discharge medications.  Relevant Imaging Results:  Relevant Lab Results:   Additional Information SSN: [place] [address] [place], [place]

## 2021-05-16 NOTE — Plan of Care (Signed)

## 2021-05-17 DIAGNOSIS — R160 Hepatomegaly, not elsewhere classified: Secondary | ICD-10-CM

## 2021-05-17 DIAGNOSIS — F319 Bipolar disorder, unspecified: Secondary | ICD-10-CM | POA: Diagnosis not present

## 2021-05-17 DIAGNOSIS — R531 Weakness: Secondary | ICD-10-CM

## 2021-05-17 DIAGNOSIS — N179 Acute kidney failure, unspecified: Secondary | ICD-10-CM | POA: Diagnosis not present

## 2021-05-17 LAB — COMPREHENSIVE METABOLIC PANEL
ALT: 45 U/L — ABNORMAL HIGH (ref 0–44)
AST: 59 U/L — ABNORMAL HIGH (ref 15–41)
Albumin: 2.6 g/dL — ABNORMAL LOW (ref 3.5–5.0)
Alkaline Phosphatase: 148 U/L — ABNORMAL HIGH (ref 38–126)
Anion gap: 7 (ref 5–15)
BUN: 10 mg/dL (ref 8–23)
CO2: 20 mmol/L — ABNORMAL LOW (ref 22–32)
Calcium: 8.4 mg/dL — ABNORMAL LOW (ref 8.9–10.3)
Chloride: 114 mmol/L — ABNORMAL HIGH (ref 98–111)
Creatinine, Ser: 1.65 mg/dL — ABNORMAL HIGH (ref 0.44–1.00)
GFR, Estimated: 35 mL/min — ABNORMAL LOW (ref >60)
Glucose, Bld: 108 mg/dL — ABNORMAL HIGH (ref 70–99)
Potassium: 3.6 mmol/L (ref 3.5–5.1)
Sodium: 141 mmol/L (ref 135–145)
Total Bilirubin: 0.5 mg/dL (ref 0.3–1.2)
Total Protein: 5.6 g/dL — ABNORMAL LOW (ref 6.5–8.1)

## 2021-05-17 LAB — AFP TUMOR MARKER: AFP, Serum, Tumor Marker: 2.9 ng/mL (ref 0.0–9.2)

## 2021-05-17 LAB — CANCER ANTIGEN 19-9: CA 19-9: 16 U/mL (ref 0–35)

## 2021-05-17 LAB — CEA: CEA: 21.1 ng/mL — ABNORMAL HIGH (ref 0.0–4.7)

## 2021-05-17 LAB — AMMONIA: Ammonia: 33 umol/L (ref 9–35)

## 2021-05-17 MED ORDER — SODIUM CHLORIDE 0.9 % IV SOLN: INTRAVENOUS | Status: DC

## 2021-05-17 MED ORDER — POTASSIUM CHLORIDE 20 MEQ PO PACK
40.0000 meq | PACK | Freq: Once | ORAL | Status: AC
  Administered 2021-05-17: 40 meq via ORAL
  Filled 2021-05-17: qty 2

## 2021-05-17 NOTE — Progress Notes (Signed)
He was  [facility] PROGRESS NOTE   Megan Miranda [id number]:[id number] DOB: April 04, 1961 DOA: 05/14/2021  PCP: Patient, No Pcp Per (Inactive)  Brief History/Interval Summary: 60 y.o. female with medical history significant for bipolar 1 disorder, schizoaffective disorder, CKD stage IIIb who presented to the ED for evaluation of generalized weakness/unstable gait.  Over a period of few days prior to admission she had been feeling weaker than usual.  Usually does not require a walker or any other assistive devices.  Found to have acute kidney injury.  MRI brain did not show any acute findings.  Patient was hospitalized for further management.  Reason for Visit: Acute kidney injury  Consultants: None  Procedures: None    Subjective/Interval History: Patient mentions that she wants to go home but does not know how she will get there.  She lives in an assisted living facility.  For the most part she does not appear to comprehend her acute medical issues at this time.   Assessment/Plan:  Acute kidney injury superimposed on chronic kidney disease stage IIIb Baseline creatinine appears to be between 1.4-1.6.  Appeared to be dehydrated on admission.  Creatinine was 2.68 on presentation.   Creatinine has been slowly improving with IV fluids.  Down to 1.65 today.  Continue fluids for now.  Avoid nephrotoxic agents.  Renal ultrasound did not show any hydronephrosis.  Continues to have good urine output.  Urinary retention Has required in and out catheterization twice so far.  He was placed on Flomax.  Retention is most likely due to immobility.  Seems to be voiding on her home at this time.  Try and avoid Foley catheter placement as much as possible.     Liver lesions concerning for metastatic process abnormal LFTs MRI revealed multiple lesions in the liver concerning for metastatic process.  Discussed with patient however they are unsure as to how much she is comprehending.  She has a guardian  who is her brother.  The findings were discussed with him.  He wants to pursue as much work-up as possible. The plan is to do a CT scan of her chest abdomen and pelvis once her creatinine improves further.  If no primary source is identified then we may have to proceed to biopsy of the liver.  But patient may not be amenable to this based on what she is telling me.  This could be a very difficult situation.  We may have to involve palliative care at that time.   CEA is noted to be elevated at 21.1.  CA 19-9 and alpha-fetoprotein levels were normal. She does not know if she has ever had a colonoscopy.  None found in the chart or on Care Everywhere. No recent mammograms noted either.   There is history of breast cancer and pancreatic cancer in the family according to patient's brother.  Generalized weakness/unsteady gait MRI brain did not show any acute findings.  Patient denies any back pains.  Wonder if her weakness was due to worsening of her kidney function.   CK level was mildly elevated.  B12 levels 1110.  Folic acid normal.  TSH normal.  Does not appear to have any neurological deficits.  Denies any back pains.  PT and OT evaluation.  They do recommend skilled nursing facility for rehabilitation.   Strength in the lower extremities appear to have improved.  History of bipolar disorder/schizoaffective disorder Home medications been continued including Lamictal, Zyprexa and Ativan. Brother reported increased episodes of psychosis at the nursing  facility.  Psychiatry has been consulted to assist with management.  Normocytic anemia Drop in hemoglobin likely dilutional.  No evidence of overt bleeding.  Anemia panel unremarkable for any deficiencies.  Left renal cyst Noted to be simple cyst on ultrasound.    DVT Prophylaxis: Subcutaneous heparin Code Status: Full code Family Communication: Brother is legal guardian.  He was updated yesterday.   Disposition Plan: She is here from an assisted  living facility.  SNF recommended by PT and OT.  Status is: Inpatient  Remains inpatient appropriate because: Acute kidney injury, lesions in the liver, unsafe discharge plan       Medications: Scheduled:  heparin  5,000 Units Subcutaneous Q8H   lamoTRIgine  200 mg Oral Daily   LORazepam  1 mg Oral BID   nicotine  14 mg Transdermal Daily   OLANZapine  10 mg Oral BID   tamsulosin  0.4 mg Oral Daily   Continuous:   NFA:OZHYQMVHQIONG **OR** acetaminophen, ondansetron **OR** ondansetron (ZOFRAN) IV  Antibiotics: Anti-infectives (From admission, onward)    None       Objective:  Vital Signs  Vitals:   05/16/21 2033 05/17/21 0437 05/17/21 0443 05/17/21 1042  BP: 121/69  127/77 (!) 100/57  Pulse: 93  92 94  Resp: 16  17 17   Temp: 98.3 F (36.8 C)  97.6 F (36.4 C) 98 F (36.7 C)  TempSrc: Oral  Oral Oral  SpO2: 100%  100% 98%  Weight:  55.4 kg    Height:        Intake/Output Summary (Last 24 hours) at 05/17/2021 1120 Last data filed at 05/17/2021 1100 Gross per 24 hour  Intake 3309.87 ml  Output 4550 ml  Net -1240.13 ml    Filed Weights   05/14/21 1758 05/16/21 0534 05/17/21 0437  Weight: 55.8 kg 53 kg 55.4 kg    General appearance: Awake alert.  In no distress.  Distracted Resp: Clear to auscultation bilaterally.  Normal effort Cardio: S1-S2 is normal regular.  No S3-S4.  No rubs murmurs or bruit GI: Abdomen is soft.  Nontender nondistended.  Bowel sounds are present normal.  No masses organomegaly Extremities: Improved strength noted in the lower extremities. Neurologic: Alert and oriented x3.  No focal neurological deficits.     Lab Results:  Data Reviewed: I have personally reviewed following labs and imaging studies  CBC: Recent Labs  Lab 05/14/21 1811 05/15/21 0520 05/16/21 0325  WBC 13.1* 9.6 6.8  NEUTROABS 10.5*  --   --   HGB 11.1* 9.9* 9.1*  HCT 35.2* 31.5* 29.3*  MCV 89.1 89.0 87.7  PLT 361 248 226     Basic Metabolic  Panel: Recent Labs  Lab 05/14/21 1811 05/15/21 0520 05/16/21 0325 05/17/21 0309  NA 137 135 141 141  K 3.8 3.5 3.6 3.6  CL 103 105 110 114*  CO2 23 18* 24 20*  GLUCOSE 143* 158* 129* 108*  BUN 15 13 12 10   CREATININE 2.68* 2.14* 1.93* 1.65*  CALCIUM 9.7 8.8* 8.6* 8.4*     GFR: Estimated Creatinine Clearance: 31.3 mL/min (A) (by C-G formula based on SCr of 1.65 mg/dL (H)).  Liver Function Tests: Recent Labs  Lab 05/14/21 1811 05/15/21 0520 05/16/21 0325 05/17/21 0309  AST 104* 80* 75* 59*  ALT 43 40 45* 45*  ALKPHOS 150* 147* 144* 148*  BILITOT 1.1 1.0 0.6 0.5  PROT 6.5 5.6* 5.4* 5.6*  ALBUMIN 3.2* 2.8* 2.5* 2.6*      Recent Results (from the  past 240 hour(s))  Resp Panel by RT-PCR (Flu A&B, Covid) Nasopharyngeal Swab     Status: None   Collection Time: 05/15/21 12:10 AM   Specimen: Nasopharyngeal Swab; Nasopharyngeal(NP) swabs in vial transport medium  Result Value Ref Range Status   SARS Coronavirus 2 by RT PCR NEGATIVE NEGATIVE Final    Comment: (NOTE) SARS-CoV-2 target nucleic acids are NOT DETECTED.  The SARS-CoV-2 RNA is generally detectable in upper respiratory specimens during the acute phase of infection. The lowest concentration of SARS-CoV-2 viral copies this assay can detect is 138 copies/mL. A negative result does not preclude SARS-Cov-2 infection and should not be used as the sole basis for treatment or other patient management decisions. A negative result may occur with  improper specimen collection/handling, submission of specimen other than nasopharyngeal swab, presence of viral mutation(s) within the areas targeted by this assay, and inadequate number of viral copies(<138 copies/mL). A negative result must be combined with clinical observations, patient history, and epidemiological information. The expected result is Negative.  Fact Sheet for Patients:  EntrepreneurPulse.com.au  Fact Sheet for Healthcare Providers:   IncredibleEmployment.be  This test is no t yet approved or cleared by the [place] FDA and  has been authorized for detection and/or diagnosis of SARS-CoV-2 by FDA under an Emergency Use Authorization (EUA). This EUA will remain  in effect (meaning this test can be used) for the duration of the COVID-19 declaration under Section 564(b)(1) of the Act, 21 U.S.C.section 360bbb-3(b)(1), unless the authorization is terminated  or revoked sooner.       Influenza A by PCR NEGATIVE NEGATIVE Final   Influenza B by PCR NEGATIVE NEGATIVE Final    Comment: (NOTE) The Xpert Xpress SARS-CoV-2/FLU/RSV plus assay is intended as an aid in the diagnosis of influenza from Nasopharyngeal swab specimens and should not be used as a sole basis for treatment. Nasal washings and aspirates are unacceptable for Xpert Xpress SARS-CoV-2/FLU/RSV testing.  Fact Sheet for Patients: EntrepreneurPulse.com.au  Fact Sheet for Healthcare Providers: IncredibleEmployment.be  This test is not yet approved or cleared by the [place] FDA and has been authorized for detection and/or diagnosis of SARS-CoV-2 by FDA under an Emergency Use Authorization (EUA). This EUA will remain in effect (meaning this test can be used) for the duration of the COVID-19 declaration under Section 564(b)(1) of the Act, 21 U.S.C. section 360bbb-3(b)(1), unless the authorization is terminated or revoked.  Performed at [facility], [place] [address], [place], [place] [address]        Radiology Studies: MR LIVER WO CONRTAST  Addendum Date: 05/15/2021   ADDENDUM REPORT: 05/15/2021 15:34 ADDENDUM: Mild increased T2 signal in RIGHT paraspinous musculature about the lower lumbar spine at lower margin of the images submitted is of uncertain significance potentially representing myositis or early atrophy. Correlate with any symptoms in this location. These results were called  by telephone at the time of interpretation on 05/15/2021 at 3:34 pm to provider [facility] , who verbally acknowledged these results. Electronically Signed   By: Zetta Bills M.D.   On: 05/15/2021 15:34   Result Date: 05/15/2021 CLINICAL DATA:  Abnormal findings on previous renal sonogram. Suspected liver lesions in a 60 year old female. EXAM: MRI ABDOMEN WITHOUT CONTRAST TECHNIQUE: Multiplanar multisequence MR imaging was performed without the administration of intravenous contrast. COMPARISON:  Renal sonogram of the same date. FINDINGS: Lower chest: No effusion. No consolidation at the lung bases. Limited assessment on MRI of this area. Hepatobiliary: Numerous large hepatic metastatic lesions,  largest centered in the LEFT hepatic lobe measuring 9.2 x 6.0 cm with mixed T2 signal predominantly hypointense on T1. At least 9 additional lesions. Next largest in the dome of the RIGHT hemi liver measuring 8.3 x 7.0 cm (image 5/4) Four additional lesions which exceed 5 cm in the RIGHT hepatic lobe more inferiorly with smaller lesions elsewhere. There is both LEFT and RIGHT hepatic lobe involvement. No biliary duct distension. No gallbladder distension or pericholecystic fluid. Mild prominence of the intrahepatic biliary tree related to extrinsic compression secondary to periportal adenopathy. Pancreas: Normal intrinsic T1 signal. No ductal dilation or sign of inflammation. No focal lesion. Spleen: Normal signal in the spleen with mild splenic enlargement approximately 13 cm greatest craniocaudal dimension. Adrenals/Urinary Tract: RIGHT adrenal gland with mild thickening contiguous with hepatic mass. Nodular thickening of the LEFT adrenal gland (image 19/2) this measures approximately 15 mm, suggestion of signal loss on out of phase T1 weighted gradient echo imaging. Mild ureteral and collecting system distension. This is seen bilaterally. Diffuse infiltration of the kidneys with small cystic lesions, diffuse  nature of this process is compatible with lithium toxicity in the setting of reported bipolar disorder. Stomach/Bowel: No acute gastrointestinal process to the extent evaluated on this abdominal MRI, not well assessed. Vascular/Lymphatic: Bulky upper abdominal lymphadenopathy and periportal lymphadenopathy. (Image 15/3) 16 mm short axis lymph node in the hepatic gastric recess (Image 24/2) 17 mm periportal lymph node. (Image 26/2) 818 mm intra-aortocaval lymph node. Vascular structures are not well assessed, normal caliber on the current study. Other:  Leiomyomas partially visualized in the uterus. Musculoskeletal: No suspicious bone lesions identified. IMPRESSION: Numerous bilateral hepatic metastatic lesions largest centered in the medial segment LEFT hepatic lobe. Bulky upper abdominal lymphadenopathy and periportal lymphadenopathy, also compatible with metastatic process. Adenopathy is confined mainly to the upper abdomen. Mild splenomegaly. Correlate with any history of risk factors for liver disease/cirrhosis. This could also be related to diffuse hepatic infiltration. Liver is diffusely abnormal but without overt signs of cirrhosis. Mild prominence of the intrahepatic biliary tree related to extrinsic compression secondary to periportal adenopathy. Findings compatible with lithium toxicity, multiple tiny cystic areas, as involving the bilateral kidneys in this patient with reported history of bipolar disorder. Mild collecting system distension and distension of bilateral ureters, correlate with any signs of urinary retention. Urinary bladder is distended extending into the lower abdomen. Electronically Signed: By: Zetta Bills M.D. On: 05/15/2021 14:20       LOS: 2 days   [place] [facility] Pager on [email or web address]  05/17/2021, 11:20 AM

## 2021-05-17 NOTE — Plan of Care (Signed)

## 2021-05-18 DIAGNOSIS — R160 Hepatomegaly, not elsewhere classified: Secondary | ICD-10-CM | POA: Diagnosis not present

## 2021-05-18 DIAGNOSIS — N179 Acute kidney failure, unspecified: Secondary | ICD-10-CM | POA: Diagnosis not present

## 2021-05-18 DIAGNOSIS — R531 Weakness: Secondary | ICD-10-CM | POA: Diagnosis not present

## 2021-05-18 DIAGNOSIS — F319 Bipolar disorder, unspecified: Secondary | ICD-10-CM | POA: Diagnosis not present

## 2021-05-18 LAB — CBC
HCT: 29 % — ABNORMAL LOW (ref 36.0–46.0)
Hemoglobin: 8.9 g/dL — ABNORMAL LOW (ref 12.0–15.0)
MCH: 27.6 pg (ref 26.0–34.0)
MCHC: 30.7 g/dL (ref 30.0–36.0)
MCV: 89.8 fL (ref 80.0–100.0)
Platelets: 261 10*3/uL (ref 150–400)
RBC: 3.23 MIL/uL — ABNORMAL LOW (ref 3.87–5.11)
RDW: 15.9 % — ABNORMAL HIGH (ref 11.5–15.5)
WBC: 6.8 10*3/uL (ref 4.0–10.5)
nRBC: 0 % (ref 0.0–0.2)

## 2021-05-18 LAB — COMPREHENSIVE METABOLIC PANEL
ALT: 42 U/L (ref 0–44)
AST: 52 U/L — ABNORMAL HIGH (ref 15–41)
Albumin: 2.6 g/dL — ABNORMAL LOW (ref 3.5–5.0)
Alkaline Phosphatase: 143 U/L — ABNORMAL HIGH (ref 38–126)
Anion gap: 6 (ref 5–15)
BUN: 8 mg/dL (ref 8–23)
CO2: 21 mmol/L — ABNORMAL LOW (ref 22–32)
Calcium: 9 mg/dL (ref 8.9–10.3)
Chloride: 125 mmol/L — ABNORMAL HIGH (ref 98–111)
Creatinine, Ser: 1.75 mg/dL — ABNORMAL HIGH (ref 0.44–1.00)
GFR, Estimated: 33 mL/min — ABNORMAL LOW (ref >60)
Glucose, Bld: 162 mg/dL — ABNORMAL HIGH (ref 70–99)
Potassium: 4.2 mmol/L (ref 3.5–5.1)
Sodium: 152 mmol/L — ABNORMAL HIGH (ref 135–145)
Total Bilirubin: 0.5 mg/dL (ref 0.3–1.2)
Total Protein: 5.9 g/dL — ABNORMAL LOW (ref 6.5–8.1)

## 2021-05-18 MED ORDER — SODIUM CHLORIDE 0.45 % IV SOLN: INTRAVENOUS | Status: AC

## 2021-05-18 NOTE — Progress Notes (Signed)
He was  [facility] PROGRESS NOTE   MAXX CALAWAY IRS:[id number] DOB: 08/29/1960 DOA: 05/14/2021  PCP: Patient, No Pcp Per (Inactive)  Brief History/Interval Summary: 60 y.o. female with medical history significant for bipolar 1 disorder, schizoaffective disorder, CKD stage IIIb who presented to the ED for evaluation of generalized weakness/unstable gait.  Over a period of few days prior to admission she had been feeling weaker than usual.  Usually does not require a walker or any other assistive devices.  Found to have acute kidney injury.  MRI brain did not show any acute findings.  Patient was hospitalized for further management.  Reason for Visit: Acute kidney injury  Consultants: Psychiatry  Procedures: None    Subjective/Interval History: Patient continues to insist on going home.  She does live in an assisted living facility.  She does not seem to understand the gravity of her situation.  She does mention that the weakness in her legs has improved.  She mentions that she would pursue testing for her liver issue at a later date.  She was told that since this is all potentially metastatic cancer work-up should be done sooner than later.  Patient however does not comprehend this.   Assessment/Plan:  Acute kidney injury superimposed on chronic kidney disease stage IIIb/hypernatremia Baseline creatinine appears to be between 1.4-1.6.  Appeared to be dehydrated on admission.  Creatinine was 2.68 on presentation.   Patient was started on IV fluids with gradual improvement in creatinine.  However today creatinine noted to be 1.75.  Sodium is 152.  These labs could be erroneous.  We will recheck them tomorrow.   Avoid nephrotoxic agents.  Renal ultrasound did not show any hydronephrosis.   Continues to have good urine output.  Urinary retention Has required in and out catheterization twice so far.  He was placed on Flomax.  Retention was most likely due to immobility.  Seems to be  voiding on her home at this time.   Try and avoid Foley catheter placement as much as possible.     Liver lesions concerning for metastatic process abnormal LFTs MRI revealed multiple lesions in the liver concerning for metastatic process.  Discussed with patient however they are unsure as to how much she is comprehending.  She has a guardian who is her brother.  The findings were discussed with him.  He wants to pursue as much work-up as possible. The plan is to do a CT scan of her chest abdomen and pelvis once her creatinine improves further.  If no primary source is identified then we may have to proceed to biopsy of the liver.  But patient may not be amenable to this based on what she is telling me.  This could be a very difficult situation.  We may have to involve palliative care at that time.   CEA is noted to be elevated at 21.1.  CA 19-9 and alpha-fetoprotein levels were normal. She does not know if she has ever had a colonoscopy.  None found in the chart or on Care Everywhere. No recent mammograms noted either.   There is history of breast cancer and pancreatic cancer in the family according to patient's brother. Creatinine noted to be slightly higher today.  We will recheck labs tomorrow and then decide on CT scan.  Generalized weakness/unsteady gait MRI brain did not show any acute findings.  Patient denies any back pains.  Wonder if her weakness was due to worsening of her kidney function.   CK  level was mildly elevated.  B12 levels 1110.  Folic acid normal.  TSH normal.   Lower extremity weakness appears to have improved.   She was seen by PT and OT who recommended skilled nursing facility for short-term rehab.    History of bipolar disorder/schizoaffective disorder Home medications been continued including Lamictal, Zyprexa and Ativan. Brother reported increased episodes of psychosis at the nursing facility.  Psychiatry has been consulted to assist with management.  Normocytic  anemia Drop in hemoglobin likely dilutional.  No evidence of overt bleeding.  Anemia panel unremarkable for any deficiencies.  Left renal cyst Noted to be simple cyst on ultrasound.    DVT Prophylaxis: Subcutaneous heparin Code Status: Full code Family Communication: Brother is legal guardian.  He is being updated every other day Disposition Plan: She is here from an assisted living facility.  SNF recommended by PT and OT.  Status is: Inpatient  Remains inpatient appropriate because: Acute kidney injury, lesions in the liver, unsafe discharge plan       Medications: Scheduled:  heparin  5,000 Units Subcutaneous Q8H   lamoTRIgine  200 mg Oral Daily   LORazepam  1 mg Oral BID   nicotine  14 mg Transdermal Daily   OLANZapine  10 mg Oral BID   tamsulosin  0.4 mg Oral Daily   Continuous:  sodium chloride 75 mL/hr at 05/17/21 1235    PYP:[id number] **OR** acetaminophen, ondansetron **OR** ondansetron (ZOFRAN) IV  Antibiotics: Anti-infectives (From admission, onward)    None       Objective:  Vital Signs  Vitals:   05/17/21 1042 05/17/21 1707 05/17/21 2139 05/18/21 0639  BP: (!) 100/57 (!) 115/55 123/74 133/75  Pulse: 94 97 96 95  Resp: 17 18 16 20   Temp: 98 F (36.7 C) 97.7 F (36.5 C) 97.9 F (36.6 C) 98 F (36.7 C)  TempSrc: Oral   Oral  SpO2: 98% 100% 98% 99%  Weight:      Height:        Intake/Output Summary (Last 24 hours) at 05/18/2021 1051 Last data filed at 05/18/2021 0600 Gross per 24 hour  Intake 3982.51 ml  Output 4275 ml  Net -292.49 ml    Filed Weights   05/14/21 1758 05/16/21 0534 05/17/21 0437  Weight: 55.8 kg 53 kg 55.4 kg    General appearance: Awake alert.  In no distress.  Distracted Resp: Clear to auscultation bilaterally.  Normal effort Cardio: S1-S2 is normal regular.  No S3-S4.  No rubs murmurs or bruit GI: Abdomen is soft.  Nontender nondistended.  Bowel sounds are present normal.  No masses  organomegaly Extremities: Improved strength of the lower extremities bilaterally Neurologic:  No focal neurological deficits.    Lab Results:  Data Reviewed: I have personally reviewed following labs and imaging studies  CBC: Recent Labs  Lab 05/14/21 1811 05/15/21 0520 05/16/21 0325 05/18/21 0951  WBC 13.1* 9.6 6.8 6.8  NEUTROABS 10.5*  --   --   --   HGB 11.1* 9.9* 9.1* 8.9*  HCT 35.2* 31.5* 29.3* 29.0*  MCV 89.1 89.0 87.7 89.8  PLT 361 248 226 261     Basic Metabolic Panel: Recent Labs  Lab 05/14/21 1811 05/15/21 0520 05/16/21 0325 05/17/21 0309 05/18/21 0951  NA 137 135 141 141 152*  K 3.8 3.5 3.6 3.6 4.2  CL 103 105 110 114* 125*  CO2 23 18* 24 20* 21*  GLUCOSE 143* 158* 129* 108* 162*  BUN 15 13 12 10  8  CREATININE 2.68* 2.14* 1.93* 1.65* 1.75*  CALCIUM 9.7 8.8* 8.6* 8.4* 9.0     GFR: Estimated Creatinine Clearance: 29.5 mL/min (A) (by C-G formula based on SCr of 1.75 mg/dL (H)).  Liver Function Tests: Recent Labs  Lab 05/14/21 1811 05/15/21 0520 05/16/21 0325 05/17/21 0309 05/18/21 0951  AST 104* 80* 75* 59* 52*  ALT 43 40 45* 45* 42  ALKPHOS 150* 147* 144* 148* 143*  BILITOT 1.1 1.0 0.6 0.5 0.5  PROT 6.5 5.6* 5.4* 5.6* 5.9*  ALBUMIN 3.2* 2.8* 2.5* 2.6* 2.6*      Recent Results (from the past 240 hour(s))  Resp Panel by RT-PCR (Flu A&B, Covid) Nasopharyngeal Swab     Status: None   Collection Time: 05/15/21 12:10 AM   Specimen: Nasopharyngeal Swab; Nasopharyngeal(NP) swabs in vial transport medium  Result Value Ref Range Status   SARS Coronavirus 2 by RT PCR NEGATIVE NEGATIVE Final    Comment: (NOTE) SARS-CoV-2 target nucleic acids are NOT DETECTED.  The SARS-CoV-2 RNA is generally detectable in upper respiratory specimens during the acute phase of infection. The lowest concentration of SARS-CoV-2 viral copies this assay can detect is 138 copies/mL. A negative result does not preclude SARS-Cov-2 infection and should not be used as  the sole basis for treatment or other patient management decisions. A negative result may occur with  improper specimen collection/handling, submission of specimen other than nasopharyngeal swab, presence of viral mutation(s) within the areas targeted by this assay, and inadequate number of viral copies(<138 copies/mL). A negative result must be combined with clinical observations, patient history, and epidemiological information. The expected result is Negative.  Fact Sheet for Patients:  EntrepreneurPulse.com.au  Fact Sheet for Healthcare Providers:  IncredibleEmployment.be  This test is no t yet approved or cleared by the [place] FDA and  has been authorized for detection and/or diagnosis of SARS-CoV-2 by FDA under an Emergency Use Authorization (EUA). This EUA will remain  in effect (meaning this test can be used) for the duration of the COVID-19 declaration under Section 564(b)(1) of the Act, 21 U.S.C.section 360bbb-3(b)(1), unless the authorization is terminated  or revoked sooner.       Influenza A by PCR NEGATIVE NEGATIVE Final   Influenza B by PCR NEGATIVE NEGATIVE Final    Comment: (NOTE) The Xpert Xpress SARS-CoV-2/FLU/RSV plus assay is intended as an aid in the diagnosis of influenza from Nasopharyngeal swab specimens and should not be used as a sole basis for treatment. Nasal washings and aspirates are unacceptable for Xpert Xpress SARS-CoV-2/FLU/RSV testing.  Fact Sheet for Patients: EntrepreneurPulse.com.au  Fact Sheet for Healthcare Providers: IncredibleEmployment.be  This test is not yet approved or cleared by the [place] FDA and has been authorized for detection and/or diagnosis of SARS-CoV-2 by FDA under an Emergency Use Authorization (EUA). This EUA will remain in effect (meaning this test can be used) for the duration of the COVID-19 declaration under Section 564(b)(1) of  the Act, 21 U.S.C. section 360bbb-3(b)(1), unless the authorization is terminated or revoked.  Performed at [facility], [place] [address] [place], [place] [address]        Radiology Studies: No results found.     LOS: 3 days   Jeyda Siebel [employer] on [email or web address]  05/18/2021, 10:51 AM

## 2021-05-19 ENCOUNTER — Inpatient Hospital Stay (HOSPITAL_COMMUNITY): Payer: BC Managed Care – PPO

## 2021-05-19 DIAGNOSIS — F319 Bipolar disorder, unspecified: Secondary | ICD-10-CM | POA: Diagnosis not present

## 2021-05-19 DIAGNOSIS — F05 Delirium due to known physiological condition: Secondary | ICD-10-CM | POA: Diagnosis not present

## 2021-05-19 DIAGNOSIS — R531 Weakness: Secondary | ICD-10-CM | POA: Diagnosis not present

## 2021-05-19 DIAGNOSIS — N179 Acute kidney failure, unspecified: Secondary | ICD-10-CM | POA: Diagnosis not present

## 2021-05-19 DIAGNOSIS — R160 Hepatomegaly, not elsewhere classified: Secondary | ICD-10-CM | POA: Diagnosis not present

## 2021-05-19 LAB — COMPREHENSIVE METABOLIC PANEL
ALT: 45 U/L — ABNORMAL HIGH (ref 0–44)
AST: 59 U/L — ABNORMAL HIGH (ref 15–41)
Albumin: 2.6 g/dL — ABNORMAL LOW (ref 3.5–5.0)
Alkaline Phosphatase: 145 U/L — ABNORMAL HIGH (ref 38–126)
Anion gap: 9 (ref 5–15)
BUN: 9 mg/dL (ref 8–23)
CO2: 20 mmol/L — ABNORMAL LOW (ref 22–32)
Calcium: 8.9 mg/dL (ref 8.9–10.3)
Chloride: 120 mmol/L — ABNORMAL HIGH (ref 98–111)
Creatinine, Ser: 1.6 mg/dL — ABNORMAL HIGH (ref 0.44–1.00)
GFR, Estimated: 36 mL/min — ABNORMAL LOW (ref >60)
Glucose, Bld: 102 mg/dL — ABNORMAL HIGH (ref 70–99)
Potassium: 4.1 mmol/L (ref 3.5–5.1)
Sodium: 149 mmol/L — ABNORMAL HIGH (ref 135–145)
Total Bilirubin: 0.3 mg/dL (ref 0.3–1.2)
Total Protein: 5.7 g/dL — ABNORMAL LOW (ref 6.5–8.1)

## 2021-05-19 LAB — CBC
HCT: 29.5 % — ABNORMAL LOW (ref 36.0–46.0)
Hemoglobin: 8.9 g/dL — ABNORMAL LOW (ref 12.0–15.0)
MCH: 27.6 pg (ref 26.0–34.0)
MCHC: 30.2 g/dL (ref 30.0–36.0)
MCV: 91.3 fL (ref 80.0–100.0)
Platelets: 249 10*3/uL (ref 150–400)
RBC: 3.23 MIL/uL — ABNORMAL LOW (ref 3.87–5.11)
RDW: 15.9 % — ABNORMAL HIGH (ref 11.5–15.5)
WBC: 8 10*3/uL (ref 4.0–10.5)
nRBC: 0 % (ref 0.0–0.2)

## 2021-05-19 MED ORDER — FLEET ENEMA 7-19 GM/118ML RE ENEM
1.0000 | ENEMA | Freq: Once | RECTAL | Status: DC
  Filled 2021-05-19: qty 1

## 2021-05-19 MED ORDER — WHITE PETROLATUM EX OINT
TOPICAL_OINTMENT | CUTANEOUS | Status: AC
  Filled 2021-05-19: qty 28.35

## 2021-05-19 MED ORDER — IOHEXOL 9 MG/ML PO SOLN
ORAL | Status: AC
  Administered 2021-05-19: 500 mL
  Filled 2021-05-19: qty 1000

## 2021-05-19 MED ORDER — LOPERAMIDE HCL 2 MG PO CAPS
4.0000 mg | ORAL_CAPSULE | Freq: Once | ORAL | Status: AC
  Administered 2021-05-19: 4 mg via ORAL
  Filled 2021-05-19: qty 2

## 2021-05-19 NOTE — Consult Note (Signed)
[facility] Face-to-Face Follow Up Psychiatry Consult   Patient Identification: Megan Miranda MRN:  [id number] Principal Diagnosis: Delirium due to another medical condition Diagnosis:  Principal Problem:   Delirium due to another medical condition Active Problems:   Bipolar I disorder ([place])   Acute kidney injury superimposed on chronic kidney disease ([place])   Generalized weakness   AKI (acute kidney injury) ([place])   Total Time spent with patient:  30 minutes  Subjective:   Megan Miranda is a 60 y.o. female with history of schizoaffective disorder, CKD stage IIIb, who presents to the ED for evaluation of generalized weakness/unsteady gait.  She is currently treated for AGI superimposed on CKD, urinary retention.  Noted that she was found to have liver lesions concerning for metastatsis with abnormal LFTs.  Psychiatry is consulted for medication adjustment due to concern from her brother, who reported "more psychotic" at her ALF for the last two weeks. (Noted that although we were originally consulted for capacity evaluation for discharge to home, it was found out that the patient has POA, her brother, and the court declared that she is incompetent in 2020 according to the primary team).      05/19/21 Patient seen and chart reviewed.  Patient is doing much better today.  She is alert and oriented x3.  She remembered that she was in the hospital because of fall and not feeling good.  She denies any suicidal thoughts, hallucination or any paranoia.  She is happy that her brother, sister-in-law and other family members came to visit her.  As per staff there is no outburst, agitation, combative behavior.  She admitted to feeling tired due to multiple test and hospitalization blood denies any crying spells or any feeling of hopelessness or suicidal thoughts.  She is on olanzapine which is helping her sleep.  She reported slept very well last night.  Sometimes she appears confused but on prompt able to answer the  question.  She acknowledged she lives in assisted living facility. Staff denies any concern except that she appears to be weak and has unsteady gait, although she used to be able to walk around with a walker.  She usually engages well, talks well with other people.    Medication: olanzapine 10 mg BID, lamotrigine 200 mg daily, lorazepam 1 mg twice a day (lithium is not on the list according to the staff) CAM-ICU positive  Head MRI on 04/2021 IMPRESSION: 1. No acute intracranial infarct or other abnormality. 2. Mildly advanced cerebral atrophy for age.   Past Psychiatric History:  Outpatient: used to seen by Dr. Casimiro Needle, last in 2019. Reported history of bipolar disorder Psychiatry admission: "multiple times" per Dr. Karen Chafe note Previous suicide attempt: denies Past trials of medication: abilify, fluphenazine, lithium,  History of violence:  denies  Risk to Self:  moderate due to delirium Risk to Others:  low Prior Inpatient Therapy:   Prior Outpatient Therapy:    Past Medical History:  Past Medical History:  Diagnosis Date   Bipolar disorder ([place])    Schizoaffective disorder ([place])    No past surgical history on file. Family History: No family history on file. Family Psychiatric  History: denies Social History:  Social History   Substance and Sexual Activity  Alcohol Use No     Social History   Substance and Sexual Activity  Drug Use No    Social History   Socioeconomic History   Marital status: Married    Spouse name: Not on file  Number of children: Not on file   Years of education: Not on file   Highest education level: Not on file  Occupational History   Not on file  Tobacco Use   Smoking status: Some Days    Packs/day: 0.10    Years: 2.00    Pack years: 0.20    Types: Cigarettes   Smokeless tobacco: Never   Tobacco comments:    Smokes a few cigarettes a week.   Vaping Use   Vaping Use: Never used  Substance and Sexual Activity   Alcohol use: No    Drug use: No   Sexual activity: Never  Other Topics Concern   Not on file  Social History Narrative   Not on file   Social Determinants of Health   Financial Resource Strain: Not on file  Food Insecurity: Not on file  Transportation Needs: Not on file  Physical Activity: Not on file  Stress: Not on file  Social Connections: Not on file   Additional Social History:    Allergies:   Allergies  Allergen Reactions   Bee Venom Itching   Soap Rash    Labs:  Results for orders placed or performed during the hospital encounter of 05/14/21 (from the past 48 hour(s))  Comprehensive metabolic panel     Status: Abnormal   Collection Time: 05/18/21  9:51 AM  Result Value Ref Range   Sodium 152 (H) 135 - 145 mmol/L   Potassium 4.2 3.5 - 5.1 mmol/L   Chloride 125 (H) 98 - 111 mmol/L   CO2 21 (L) 22 - 32 mmol/L   Glucose, Bld 162 (H) 70 - 99 mg/dL    Comment: Glucose reference range applies only to samples taken after fasting for at least 8 hours.   BUN 8 8 - 23 mg/dL   Creatinine, Ser 1.75 (H) 0.44 - 1.00 mg/dL   Calcium 9.0 8.9 - 10.3 mg/dL   Total Protein 5.9 (L) 6.5 - 8.1 g/dL   Albumin 2.6 (L) 3.5 - 5.0 g/dL   AST 52 (H) 15 - 41 U/L   ALT 42 0 - 44 U/L   Alkaline Phosphatase 143 (H) 38 - 126 U/L   Total Bilirubin 0.5 0.3 - 1.2 mg/dL   GFR, Estimated 33 (L) >60 mL/min    Comment: (NOTE) Calculated using the CKD-EPI Creatinine Equation (2021)    Anion gap 6 5 - 15    Comment: Performed at [facility], [place] [address], [place], [place] [address]  CBC     Status: Abnormal   Collection Time: 05/18/21  9:51 AM  Result Value Ref Range   WBC 6.8 4.0 - 10.5 K/uL   RBC 3.23 (L) 3.87 - 5.11 MIL/uL   Hemoglobin 8.9 (L) 12.0 - 15.0 g/dL   HCT 29.0 (L) 36.0 - 46.0 %   MCV 89.8 80.0 - 100.0 fL   MCH 27.6 26.0 - 34.0 pg   MCHC 30.7 30.0 - 36.0 g/dL   RDW 15.9 (H) 11.5 - 15.5 %   Platelets 261 150 - 400 K/uL   nRBC 0.0 0.0 - 0.2 %    Comment: Performed at [facility], [place] [address] [place], [place] [address]  Comprehensive metabolic panel     Status: Abnormal   Collection Time: 05/19/21  4:44 AM  Result Value Ref Range   Sodium 149 (H) 135 - 145 mmol/L   Potassium 4.1 3.5 - 5.1 mmol/L   Chloride 120 (H) 98 -  111 mmol/L   CO2 20 (L) 22 - 32 mmol/L   Glucose, Bld 102 (H) 70 - 99 mg/dL    Comment: Glucose reference range applies only to samples taken after fasting for at least 8 hours.   BUN 9 8 - 23 mg/dL   Creatinine, Ser 1.60 (H) 0.44 - 1.00 mg/dL   Calcium 8.9 8.9 - 10.3 mg/dL   Total Protein 5.7 (L) 6.5 - 8.1 g/dL   Albumin 2.6 (L) 3.5 - 5.0 g/dL   AST 59 (H) 15 - 41 U/L   ALT 45 (H) 0 - 44 U/L   Alkaline Phosphatase 145 (H) 38 - 126 U/L   Total Bilirubin 0.3 0.3 - 1.2 mg/dL   GFR, Estimated 36 (L) >60 mL/min    Comment: (NOTE) Calculated using the CKD-EPI Creatinine Equation (2021)    Anion gap 9 5 - 15    Comment: Performed at [facility], [place] [address], [place], [place] [address]  CBC     Status: Abnormal   Collection Time: 05/19/21  4:44 AM  Result Value Ref Range   WBC 8.0 4.0 - 10.5 K/uL   RBC 3.23 (L) 3.87 - 5.11 MIL/uL   Hemoglobin 8.9 (L) 12.0 - 15.0 g/dL   HCT 29.5 (L) 36.0 - 46.0 %   MCV 91.3 80.0 - 100.0 fL   MCH 27.6 26.0 - 34.0 pg   MCHC 30.2 30.0 - 36.0 g/dL   RDW 15.9 (H) 11.5 - 15.5 %   Platelets 249 150 - 400 K/uL   nRBC 0.0 0.0 - 0.2 %    Comment: Performed at [facility], [place] [address] [place], [place] [address]    Current Facility-Administered Medications  Medication Dose Route Frequency Provider Last Rate Last Admin   0.45 % sodium chloride infusion   Intravenous Continuous Bonnielee Haff, MD 75 mL/hr at 05/18/21 1754 Infusion Verify at 05/18/21 1754   acetaminophen (TYLENOL) tablet 650 mg  650 mg Oral Q6H PRN Lenore Cordia, MD   650 mg at 05/16/21 1619   Or   acetaminophen (TYLENOL) suppository 650 mg  650 mg Rectal Q6H PRN Lenore Cordia, MD       heparin injection 5,000 Units   5,000 Units Subcutaneous Q8H Zada Finders R, MD   5,000 Units at 05/19/21 0524   iohexol (OMNIPAQUE) 9 MG/ML oral solution            lamoTRIgine (LAMICTAL) tablet 200 mg  200 mg Oral Daily Zada Finders R, MD   200 mg at 05/19/21 0901   loperamide (IMODIUM) capsule 4 mg  4 mg Oral Once Bonnielee Haff, MD       LORazepam (ATIVAN) tablet 1 mg  1 mg Oral BID Zada Finders R, MD   1 mg at 05/19/21 0901   nicotine (NICODERM CQ - dosed in mg/24 hours) patch 14 mg  14 mg Transdermal Daily Bonnielee Haff, MD   14 mg at 05/19/21 0901   OLANZapine (ZYPREXA) tablet 10 mg  10 mg Oral BID Zada Finders R, MD   10 mg at 05/19/21 0901   ondansetron (ZOFRAN) tablet 4 mg  4 mg Oral Q6H PRN Lenore Cordia, MD   4 mg at 05/19/21 0901   Or   ondansetron (ZOFRAN) injection 4 mg  4 mg Intravenous Q6H PRN Lenore Cordia, MD       tamsulosin (FLOMAX) capsule 0.4 mg  0.4 mg Oral Daily Bonnielee Haff, MD   0.4  mg at 05/19/21 0901    Musculoskeletal: Strength & Muscle Tone: decreased Gait & Station:  n/a, lying in the bed Patient leans: N/A            Psychiatric Specialty Exam:  Presentation  General Appearance: Fairly Groomed Eye Contact:Fair Speech:Normal Rate Speech Volume:Normal Handedness:No data recorded  Mood and Affect  Mood:-- (fine) Affect:Restricted  Thought Process  Thought Processes:Coherent Descriptions of Associations:Loose  Orientation:Full (Time, Place and Person)  Thought Content:Other (comment)  History of Schizophrenia/Schizoaffective disorder:No data recorded + details as above Duration of Psychotic Symptoms:No data recorded many years Hallucinations:No data recorded  Ideas of Reference:None  Suicidal Thoughts:No data recorded  Homicidal Thoughts:No data recorded   Sensorium  Memory:Immediate Poor Judgment:Impaired Insight:Lacking  Executive Functions  Concentration:Poor Attention Span:Poor [place]  Psychomotor Activity  Psychomotor Activity:No data recorded  Assets  Assets:Social Support  Sleep  Sleep:No data recorded  Physical Exam: Physical Exam Vitals and nursing note reviewed.   Review of Systems  All other systems reviewed and are negative. Blood pressure 134/81, pulse (!) 109, temperature 97.6 F (36.4 C), resp. rate 18, height 5\' 5"  (1.651 m), weight 55.4 kg, SpO2 99 %. Body mass index is 20.32 kg/m.  Treatment Plan Summary: Megan Miranda is a 60 y.o. female with history of schizoaffective disorder, CKD stage IIIb, who presents to the ED for evaluation of generalized weakness/unsteady gait.  She is currently treated for AKI superimposed on CKD, urinary retention.  Noted that she was found to have liver lesions concerning for metastatsis with abnormal LFTs.  Psychiatry is consulted for medication adjustment due to concern from her brother, who reported "more psychotic" at her ALF for the last two weeks.   # Delirium: Improving # Schizoaffective disorder Patient currently does not exhibit any hallucination, paranoia and as per staff no agitation or outbursts behavior.  Plan -Continue olanzapine 10 mg twice a day (EKG 447 msec on 12/21) -Continue lamotrigine 200 mg daily -Continue lorazepam 1 mg twice a day (if any worsening in delirium, would advise to lower the dose given it is deliriogenic) -Consider olanzapine 2.5 mg PO bid prn for severe agitation - Continue to monitor and treat underlying medical causes of delirium, including infection, electrolyte disturbances, etc. - Delirium precautions - Minimize/avoid deliriogenic meds including: anticholinergic, opiates, benzodiazepines           - Maintain hydration, oxygenation, nutrition           - Limit use of restraints and catheters           - Normalize sleep patterns by minimizing nighttime noise, light and interruptions by                clustering care, opening blinds during the  day           - Reorient the patient frequently, provide easily visible clock and calendar           - Provide sensory aids like glasses, hearing aids           - Encourage ambulation, regular activities and visitors to maintain cognitive stimulation   - The court declared that she is incompetent in 2020, and her brother is her POA  Disposition: Patient does not meet criteria for psychiatric inpatient admission.  Thank you for your consult.  We will sign off and if there is a change in the mental status or new symptoms then please reconsult psychiatry.     Kathlee Nations,  MD 05/19/2021 10:40 AM

## 2021-05-19 NOTE — Progress Notes (Signed)
[facility] PROGRESS NOTE   Megan Miranda ZOX:[id number] DOB: [birth year] DOA: 05/14/2021  PCP: Patient, No Pcp Per (Inactive)  Brief History/Interval Summary: 60 y.o. female with medical history significant for bipolar 1 disorder, schizoaffective disorder, CKD stage IIIb who presented to the ED for evaluation of generalized weakness/unstable gait.  Over a period of few days prior to admission she had been feeling weaker than usual.  Usually does not require a walker or any other assistive devices.  Found to have acute kidney injury.  MRI brain did not show any acute findings.  Patient was hospitalized for further management.  Reason for Visit: Acute kidney injury  Consultants: Psychiatry  Procedures: None    Subjective/Interval History: Patient reports diarrhea overnight.  Denies any abdominal pain nausea or vomiting.  Remains distracted.  Continues to ask about going home.     Assessment/Plan:  Acute kidney injury superimposed on chronic kidney disease stage IIIb/hypernatremia Baseline creatinine appears to be between 1.4-1.6.  Appeared to be dehydrated on admission.  Creatinine was 2.68 on presentation.   Patient was started on IV fluids with gradual improvement in creatinine.   Sodium levels noted to be better today.  IV fluids were changed over to half-normal yesterday.  Creatinine is 1.6.  Looks like she is now back to her baseline.  Do not anticipate that her creatinine will improve any further. Renal ultrasound did not show any hydronephrosis.  Continue to avoid nephrotoxic agents. Monitor urine output.    Liver lesions concerning for metastatic process abnormal LFTs MRI revealed multiple lesions in the liver concerning for metastatic process.  Discussed with patient however they are unsure as to how much she is comprehending.  She has a guardian who is her brother.  The findings were discussed with him.  He wants to pursue as much work-up as possible. CEA is noted to  be elevated at 21.1.  CA 19-9 and alpha-fetoprotein levels were normal. She does not know if she has ever had a colonoscopy.  None found in the chart or on Care Everywhere. No recent mammograms noted either.   There is history of breast cancer and pancreatic cancer in the family according to patient's brother. We are waiting on her renal function to improve before proceeding with CT scan.  However she now seems to be back to her baseline.  Cannot use contrast studies.  Will proceed with CT without contrast which will give [place] limited information but unfortunately there are no other options available at this time. Also of note is the fact that patient has been very reluctant to pursue aggressive testing for her condition.  We may need to involve palliative care eventually.  Acute diarrhea Noted to have acute diarrhea overnight.  Use Imodium.  Abdomen is benign.  WBC is normal.  Do not suspect any infectious etiology.  Generalized weakness/unsteady gait MRI brain did not show any acute findings.  Patient denies any back pains.  Wonder if her weakness was due to worsening of her kidney function.   CK level was mildly elevated.  B12 levels 1110.  Folic acid normal.  TSH normal.   Lower extremity weakness appears to have improved.   She was seen by PT and OT who recommended skilled nursing facility for short-term rehab.    History of bipolar disorder/schizoaffective disorder Home medications been continued including Lamictal, Zyprexa and Ativan. Brother reported increased episodes of psychosis at the nursing facility.   Psychiatry has been consulted to assist with management.  Normocytic  anemia Drop in hemoglobin likely dilutional.  No evidence of overt bleeding.  Anemia panel unremarkable for any deficiencies. Hemoglobin is stable.  Urinary retention Required in and out catheterization twice initially during this admission.  Patient was placed on Flomax.  Retention was thought to be due to  immobility. Now seems to be voiding on her own.  Continue to monitor.       Left renal cyst Noted to be simple cyst on ultrasound.    DVT Prophylaxis: Subcutaneous heparin Code Status: Full code Family Communication: Brother is legal guardian.  He is being updated every other day Disposition Plan: She is here from an assisted living facility.  SNF recommended by PT and OT.  Status is: Inpatient  Remains inpatient appropriate because: Acute kidney injury, lesions in the liver, unsafe discharge plan       Medications: Scheduled:  heparin  5,000 Units Subcutaneous Q8H   iohexol       lamoTRIgine  200 mg Oral Daily   loperamide  4 mg Oral Once   LORazepam  1 mg Oral BID   nicotine  14 mg Transdermal Daily   OLANZapine  10 mg Oral BID   tamsulosin  0.4 mg Oral Daily   Continuous:  sodium chloride 75 mL/hr at 05/18/21 1754    LGX:QJJHERDEYCXKG **OR** acetaminophen, ondansetron **OR** ondansetron (ZOFRAN) IV  Antibiotics: Anti-infectives (From admission, onward)    None       Objective:  Vital Signs  Vitals:   05/18/21 0639 05/18/21 1806 05/18/21 1955 05/19/21 0857  BP: 133/75 119/73 (!) 125/55 134/81  Pulse: 95 95 89 (!) 109  Resp: 20 18 18 18   Temp: 98 F (36.7 C) 98.2 F (36.8 C) 98.1 F (36.7 C) 97.6 F (36.4 C)  TempSrc: Oral Oral Oral   SpO2: 99% 100% 100% 99%  Weight:      Height:        Intake/Output Summary (Last 24 hours) at 05/19/2021 1026 Last data filed at 05/19/2021 0900 Gross per 24 hour  Intake 1902.25 ml  Output 1800 ml  Net 102.25 ml    Filed Weights   05/14/21 1758 05/16/21 0534 05/17/21 0437  Weight: 55.8 kg 53 kg 55.4 kg    General appearance: Awake alert.  In no distress.  Remains distracted Resp: Clear to auscultation bilaterally.  Normal effort Cardio: S1-S2 is normal regular.  No S3-S4.  No rubs murmurs or bruit GI: Abdomen is soft.  Nontender nondistended.  Bowel sounds are present normal.  No masses  organomegaly Extremities: No edema.  Improved range of motion of the lower extremities noted. Neurologic: No focal neurological deficits.     Lab Results:  Data Reviewed: I have personally reviewed following labs and imaging studies  CBC: Recent Labs  Lab 05/14/21 1811 05/15/21 0520 05/16/21 0325 05/18/21 0951 05/19/21 0444  WBC 13.1* 9.6 6.8 6.8 8.0  NEUTROABS 10.5*  --   --   --   --   HGB 11.1* 9.9* 9.1* 8.9* 8.9*  HCT 35.2* 31.5* 29.3* 29.0* 29.5*  MCV 89.1 89.0 87.7 89.8 91.3  PLT 361 248 226 261 249     Basic Metabolic Panel: Recent Labs  Lab 05/15/21 0520 05/16/21 0325 05/17/21 0309 05/18/21 0951 05/19/21 0444  NA 135 141 141 152* 149*  K 3.5 3.6 3.6 4.2 4.1  CL 105 110 114* 125* 120*  CO2 18* 24 20* 21* 20*  GLUCOSE 158* 129* 108* 162* 102*  BUN 13 12 10 8  9  CREATININE 2.14* 1.93* 1.65* 1.75* 1.60*  CALCIUM 8.8* 8.6* 8.4* 9.0 8.9     GFR: Estimated Creatinine Clearance: 32.3 mL/min (A) (by C-G formula based on SCr of 1.6 mg/dL (H)).  Liver Function Tests: Recent Labs  Lab 05/15/21 0520 05/16/21 0325 05/17/21 0309 05/18/21 0951 05/19/21 0444  AST 80* 75* 59* 52* 59*  ALT 40 45* 45* 42 45*  ALKPHOS 147* 144* 148* 143* 145*  BILITOT 1.0 0.6 0.5 0.5 0.3  PROT 5.6* 5.4* 5.6* 5.9* 5.7*  ALBUMIN 2.8* 2.5* 2.6* 2.6* 2.6*      Recent Results (from the past 240 hour(s))  Resp Panel by RT-PCR (Flu A&B, Covid) Nasopharyngeal Swab     Status: None   Collection Time: 05/15/21 12:10 AM   Specimen: Nasopharyngeal Swab; Nasopharyngeal(NP) swabs in vial transport medium  Result Value Ref Range Status   SARS Coronavirus 2 by RT PCR NEGATIVE NEGATIVE Final    Comment: (NOTE) SARS-CoV-2 target nucleic acids are NOT DETECTED.  The SARS-CoV-2 RNA is generally detectable in upper respiratory specimens during the acute phase of infection. The lowest concentration of SARS-CoV-2 viral copies this assay can detect is 138 copies/mL. A negative result does  not preclude SARS-Cov-2 infection and should not be used as the sole basis for treatment or other patient management decisions. A negative result may occur with  improper specimen collection/handling, submission of specimen other than nasopharyngeal swab, presence of viral mutation(s) within the areas targeted by this assay, and inadequate number of viral copies(<138 copies/mL). A negative result must be combined with clinical observations, patient history, and epidemiological information. The expected result is Negative.  Fact Sheet for Patients:  EntrepreneurPulse.com.au  Fact Sheet for Healthcare Providers:  IncredibleEmployment.be  This test is no t yet approved or cleared by the [place] FDA and  has been authorized for detection and/or diagnosis of SARS-CoV-2 by FDA under an Emergency Use Authorization (EUA). This EUA will remain  in effect (meaning this test can be used) for the duration of the COVID-19 declaration under Section 564(b)(1) of the Act, 21 U.S.C.section 360bbb-3(b)(1), unless the authorization is terminated  or revoked sooner.       Influenza A by PCR NEGATIVE NEGATIVE Final   Influenza B by PCR NEGATIVE NEGATIVE Final    Comment: (NOTE) The Xpert Xpress SARS-CoV-2/FLU/RSV plus assay is intended as an aid in the diagnosis of influenza from Nasopharyngeal swab specimens and should not be used as a sole basis for treatment. Nasal washings and aspirates are unacceptable for Xpert Xpress SARS-CoV-2/FLU/RSV testing.  Fact Sheet for Patients: EntrepreneurPulse.com.au  Fact Sheet for Healthcare Providers: IncredibleEmployment.be  This test is not yet approved or cleared by the [place] FDA and has been authorized for detection and/or diagnosis of SARS-CoV-2 by FDA under an Emergency Use Authorization (EUA). This EUA will remain in effect (meaning this test can be used) for the  duration of the COVID-19 declaration under Section 564(b)(1) of the Act, 21 U.S.C. section 360bbb-3(b)(1), unless the authorization is terminated or revoked.  Performed at [facility], [place] [address] [place], [place] [address]        Radiology Studies: No results found.     LOS: 4 days   Siddalee Vanderheiden [employer] on [email or web address]  05/19/2021, 10:26 AM

## 2021-05-19 NOTE — Progress Notes (Signed)
Physical Therapy Treatment Patient Details Name: Megan Miranda MRN: [id number] DOB: 1960/12/09 Today's Date: 05/19/2021   History of Present Illness Megan Miranda is a 60 y.o. female who presented 05/14/2021 to the ED for evaluation of generalized weakness/unstable gait and falls where legs gave out. Found to have acute kidney injury superimposed on chronic kidney disease, MRI brain negative for acute event. Significant medical history for bipolar 1 disorder, schizoaffective disorder, CKD stage IIIb    PT Comments    Pt progressing slowly towards her physical therapy goals. She is agreeable to a limited treatment session with encouragement; reporting fatigue and weakness post fall. Education provided regarding importance of mobilizing to increase strength. Pt requiring moderate assist for bed mobility and transfers to standing. She is unable to weight shift to take steps due to heavy retropulsion. Worked on pre gait training. Continue to recommend SNF for ongoing Physical Therapy.      Recommendations for follow up therapy are one component of a multi-disciplinary discharge planning process, led by the attending physician.  Recommendations may be updated based on patient status, additional functional criteria and insurance authorization.  Follow Up Recommendations  Skilled nursing-short term rehab (<3 hours/day)     Assistance Recommended at Discharge Frequent or constant Supervision/Assistance  Equipment Recommendations  Wheelchair (measurements PT);Wheelchair cushion (measurements PT);BSC/3in1    Recommendations for Other Services       Precautions / Restrictions Precautions Precautions: Fall Restrictions Weight Bearing Restrictions: No     Mobility  Bed Mobility Overal bed mobility: Needs Assistance Bed Mobility: Supine to Sit;Sit to Supine     Supine to sit: Mod assist Sit to supine: Mod assist   General bed mobility comments: Decreased initiation, assist for BLE's off  edge of bed and trunk. cues for use of rail    Transfers Overall transfer level: Needs assistance Equipment used: Rolling walker (2 wheels) Transfers: Sit to/from Stand Sit to Stand: Min assist;Mod assist           General transfer comment: Min-modA to rise from edge of bed, pt pulling up on walker, bracing back heavily on edge of bed    Ambulation/Gait                   Stairs             Wheelchair Mobility    Modified Rankin (Stroke Patients Only)       Balance Overall balance assessment: Needs assistance Sitting-balance support: Feet supported Sitting balance-Leahy Scale: Poor Sitting balance - Comments: posterior lean with challenge   Standing balance support: Bilateral upper extremity supported Standing balance-Leahy Scale: Poor Standing balance comment: posterior lean                            Cognition Arousal/Alertness: Awake/alert Behavior During Therapy: WFL for tasks assessed/performed Overall Cognitive Status: No family/caregiver present to determine baseline cognitive functioning Area of Impairment: Memory;Following commands;Problem solving                     Memory: Decreased short-term memory Following Commands: Follows one step commands with increased time;Follows one step commands consistently     Problem Solving: Slow processing;Decreased initiation;Requires verbal cues;Requires tactile cues General Comments: Pt recently went down to CT scan, asking if we can "get back on the stretcher to go back to her room," and pointing to recliner. Re-oriented pt we were back in her room and pt stating, "Oh  that was fast." Also asking if her "chemo was in the bags," pointing to the sink.        Exercises General Exercises - Lower Extremity Long Arc Quad: Both;10 reps;Seated Hip Flexion/Marching: Both;10 reps;Seated Other Exercises Other Exercises: x3 sit to stands from edge of bed    General Comments         Pertinent Vitals/Pain Pain Assessment: No/denies pain    Home Living                          Prior Function            PT Goals (current goals can now be found in the care plan section) Acute Rehab PT Goals Patient Stated Goal: go home Potential to Achieve Goals: Good Progress towards PT goals: Progressing toward goals    Frequency    Min 2X/week      PT Plan Current plan remains appropriate    Co-evaluation              AM-PAC PT "6 Clicks" Mobility   Outcome Measure  Help needed turning from your back to your side while in a flat bed without using bedrails?: A Little Help needed moving from lying on your back to sitting on the side of a flat bed without using bedrails?: A Lot Help needed moving to and from a bed to a chair (including a wheelchair)?: A Lot Help needed standing up from a chair using your arms (e.g., wheelchair or bedside chair)?: A Lot Help needed to walk in hospital room?: Total Help needed climbing 3-5 steps with a railing? : Total 6 Click Score: 11    End of Session Equipment Utilized During Treatment: Gait belt Activity Tolerance: Patient limited by fatigue Patient left: in bed;with call bell/phone within reach;with bed alarm set Nurse Communication: Mobility status PT Visit Diagnosis: Other abnormalities of gait and mobility (R26.89);History of falling (Z91.81);Muscle weakness (generalized) (M62.81);Other symptoms and signs involving the nervous system (R29.898)     Time: 1339-1400 PT Time Calculation (min) (ACUTE ONLY): 21 min  Charges:  $Therapeutic Activity: 8-22 mins                     Megan Miranda, PT, DPT Acute Rehabilitation Services Pager [phone] Office [phone]    Megan Miranda 05/19/2021, 3:04 PM

## 2021-05-20 ENCOUNTER — Encounter (HOSPITAL_COMMUNITY): Payer: Self-pay | Admitting: Internal Medicine

## 2021-05-20 DIAGNOSIS — N179 Acute kidney failure, unspecified: Secondary | ICD-10-CM | POA: Diagnosis not present

## 2021-05-20 DIAGNOSIS — F319 Bipolar disorder, unspecified: Secondary | ICD-10-CM | POA: Diagnosis not present

## 2021-05-20 DIAGNOSIS — R531 Weakness: Secondary | ICD-10-CM | POA: Diagnosis not present

## 2021-05-20 DIAGNOSIS — R160 Hepatomegaly, not elsewhere classified: Secondary | ICD-10-CM | POA: Diagnosis not present

## 2021-05-20 LAB — COMPREHENSIVE METABOLIC PANEL
ALT: 42 U/L (ref 0–44)
AST: 53 U/L — ABNORMAL HIGH (ref 15–41)
Albumin: 2.9 g/dL — ABNORMAL LOW (ref 3.5–5.0)
Alkaline Phosphatase: 162 U/L — ABNORMAL HIGH (ref 38–126)
Anion gap: 11 (ref 5–15)
BUN: 14 mg/dL (ref 8–23)
CO2: 19 mmol/L — ABNORMAL LOW (ref 22–32)
Calcium: 9.4 mg/dL (ref 8.9–10.3)
Chloride: 121 mmol/L — ABNORMAL HIGH (ref 98–111)
Creatinine, Ser: 1.97 mg/dL — ABNORMAL HIGH (ref 0.44–1.00)
GFR, Estimated: 28 mL/min — ABNORMAL LOW (ref >60)
Glucose, Bld: 117 mg/dL — ABNORMAL HIGH (ref 70–99)
Potassium: 4.5 mmol/L (ref 3.5–5.1)
Sodium: 151 mmol/L — ABNORMAL HIGH (ref 135–145)
Total Bilirubin: 0.6 mg/dL (ref 0.3–1.2)
Total Protein: 6.1 g/dL — ABNORMAL LOW (ref 6.5–8.1)

## 2021-05-20 LAB — PROTIME-INR
INR: 1.1 (ref 0.8–1.2)
Prothrombin Time: 14.3 seconds (ref 11.4–15.2)

## 2021-05-20 MED ORDER — FLEET ENEMA 7-19 GM/118ML RE ENEM
1.0000 | ENEMA | Freq: Once | RECTAL | Status: AC
  Administered 2021-05-20: 1 via RECTAL
  Filled 2021-05-20: qty 1

## 2021-05-20 MED ORDER — DEXTROSE 5 % IV SOLN: INTRAVENOUS | Status: AC

## 2021-05-20 NOTE — Progress Notes (Addendum)
[facility] PROGRESS NOTE   Megan Miranda TTS:[id number] DOB: 08-07-1960 DOA: 05/14/2021  PCP: Patient, No Pcp Per (Inactive)  Brief History/Interval Summary: 60 y.o. female with medical history significant for bipolar 1 disorder, schizoaffective disorder, CKD stage IIIb who presented to the ED for evaluation of generalized weakness/unstable gait.  Over a period of few days prior to admission she had been feeling weaker than usual.  Usually does not require a walker or any other assistive devices.  Found to have acute kidney injury.  MRI brain did not show any acute findings.  Patient was hospitalized for further management.  Incidentally found to have liver lesion on renal ultrasound.  This prompted MRI of the liver which raised concern for metastatic disease.  It was subsequently found out that patient was deemed incompetent in 2020 and her brother is her guardian.  Liver biopsy being pursued but patient is declining.  See below for details.  Reason for Visit: Acute kidney injury  Consultants: Psychiatry  Procedures: None    Subjective/Interval History: Patient remains distracted..  Asking about going home.  No complaints offered.  Leg weakness has improved.     Assessment/Plan:  Acute kidney injury superimposed on chronic kidney disease stage IIIb/hypernatremia Baseline creatinine appears to be between 1.4-1.6.  Appeared to be dehydrated on admission.  Creatinine was 2.68 on presentation.   Patient was started on IV fluids with gradual improvement in creatinine.   Sodium levels remain elevated.  Will change to D5.  Creatinine noted to be worse today compared to yesterday.  Renal ultrasound did not show any hydronephrosis.  Avoid nephrotoxic agents.  Monitor urine output.  Recheck labs tomorrow.   Liver lesions concerning for metastatic process abnormal LFTs MRI revealed multiple lesions in the liver concerning for metastatic process.  Discussed with patient however they  are unsure as to how much she is comprehending.  She has a guardian who is her brother.  The findings were discussed with him.  He wants to pursue as much work-up as possible. CEA is noted to be elevated at 21.1.  CA 19-9 and alpha-fetoprotein levels were normal. She does not know if she has ever had a colonoscopy.  None found in the chart or on Care Everywhere. No recent mammograms noted either.  There is history of breast cancer and pancreatic cancer in the family according to patient's brother. Since renal function did not improve he went ahead with CT scan without contrast.  Unfortunately no primary was identified on the CT scan.  Concern for uterine fibroids was raised. Findings discussed with patient's brother.  Plan is to proceed with liver biopsy.  Interventional radiology was consulted. Also of note is the fact that patient has been very reluctant to pursue aggressive testing for her condition.   Discussed with the patient's brother today.  Apparently patient has been adamantly refusing biopsy.  He spoke to interventional radiology and we are holding off for now.  Patient's ex-husband will be coming in and will try to convince her to undergo the biopsy. If patient still continues to refuse then plan will be to stabilize her, send her to SNF, involve palliative care in the community.  Fecal impaction with leakage  CT scan showed evidence for fecal impaction in the rectal area.  Fleet enema was ordered.  Abdomen remains benign.    Generalized weakness/unsteady gait MRI brain did not show any acute findings.  Patient denies any back pains.  Wonder if her weakness was due to worsening  of her kidney function.   CK level was mildly elevated.  B12 levels 1110.  Folic acid normal.  TSH normal.   Lower extremity weakness appears to have improved.   She was seen by PT and OT who recommended skilled nursing facility for short-term rehab.    History of bipolar disorder/schizoaffective disorder Home  medications been continued including Lamictal, Zyprexa and Ativan. Brother reported increased episodes of psychosis at the nursing facility.   She was seen by psychiatry as well.  They have signed off.  Normocytic anemia Drop in hemoglobin likely dilutional.  No evidence of overt bleeding.  Anemia panel unremarkable for any deficiencies. Hemoglobin is stable.  Urinary retention Required in and out catheterization twice initially during this admission.  Patient was placed on Flomax.  Retention was thought to be due to immobility. Now seems to be voiding on her own.  Continue to monitor.       Left renal cyst Noted to be simple cyst on ultrasound.    DVT Prophylaxis: Subcutaneous heparin Code Status: Full code Family Communication: Brother is legal guardian.  He is being updated every other day Disposition Plan: She is here from an assisted living facility.  SNF recommended by PT and OT.  Status is: Inpatient  Remains inpatient appropriate because: Acute kidney injury, lesions in the liver, unsafe discharge plan       Medications: Scheduled:  heparin  5,000 Units Subcutaneous Q8H   lamoTRIgine  200 mg Oral Daily   LORazepam  1 mg Oral BID   nicotine  14 mg Transdermal Daily   OLANZapine  10 mg Oral BID   sodium phosphate  1 enema Rectal Once   tamsulosin  0.4 mg Oral Daily   Continuous:  dextrose 50 mL/hr at 05/20/21 1001    NID:[id number] **OR** acetaminophen, ondansetron **OR** ondansetron (ZOFRAN) IV  Antibiotics: Anti-infectives (From admission, onward)    None       Objective:  Vital Signs  Vitals:   05/19/21 1612 05/19/21 2120 05/20/21 0522 05/20/21 0922  BP: 124/81 127/76 121/74 136/74  Pulse: 100 (!) 109 100 (!) 110  Resp: 17 18 18 18   Temp: 98.1 F (36.7 C) 98.4 F (36.9 C) 98.2 F (36.8 C) 97.8 F (36.6 C)  TempSrc: Oral  Oral Oral  SpO2: 100% 98% 99% 96%  Weight:      Height:        Intake/Output Summary (Last 24 hours) at  05/20/2021 1120 Last data filed at 05/20/2021 0900 Gross per 24 hour  Intake 1080 ml  Output 1375 ml  Net -295 ml    Filed Weights   05/14/21 1758 05/16/21 0534 05/17/21 0437  Weight: 55.8 kg 53 kg 55.4 kg    General appearance: Awake alert.  In no distress.  Distracted. Resp: Clear to auscultation bilaterally.  Normal effort Cardio: S1-S2 is normal regular.  No S3-S4.  No rubs murmurs or bruit GI: Abdomen is soft.  Nontender nondistended.  Bowel sounds are present normal.  No masses organomegaly Extremities: No edema.  Able to move her lower extremities and lift them off the bed without difficulty. Neurologic: No focal neurological deficits.     Lab Results:  Data Reviewed: I have personally reviewed following labs and imaging studies  CBC: Recent Labs  Lab 05/14/21 1811 05/15/21 0520 05/16/21 0325 05/18/21 0951 05/19/21 0444  WBC 13.1* 9.6 6.8 6.8 8.0  NEUTROABS 10.5*  --   --   --   --   HGB 11.1* 9.9*  9.1* 8.9* 8.9*  HCT 35.2* 31.5* 29.3* 29.0* 29.5*  MCV 89.1 89.0 87.7 89.8 91.3  PLT 361 248 226 261 249     Basic Metabolic Panel: Recent Labs  Lab 05/16/21 0325 05/17/21 0309 05/18/21 0951 05/19/21 0444 05/20/21 0433  NA 141 141 152* 149* 151*  K 3.6 3.6 4.2 4.1 4.5  CL 110 114* 125* 120* 121*  CO2 24 20* 21* 20* 19*  GLUCOSE 129* 108* 162* 102* 117*  BUN 12 10 8 9 14   CREATININE 1.93* 1.65* 1.75* 1.60* 1.97*  CALCIUM 8.6* 8.4* 9.0 8.9 9.4     GFR: Estimated Creatinine Clearance: 26.2 mL/min (A) (by C-G formula based on SCr of 1.97 mg/dL (H)).  Liver Function Tests: Recent Labs  Lab 05/16/21 0325 05/17/21 0309 05/18/21 0951 05/19/21 0444 05/20/21 0433  AST 75* 59* 52* 59* 53*  ALT 45* 45* 42 45* 42  ALKPHOS 144* 148* 143* 145* 162*  BILITOT 0.6 0.5 0.5 0.3 0.6  PROT 5.4* 5.6* 5.9* 5.7* 6.1*  ALBUMIN 2.5* 2.6* 2.6* 2.6* 2.9*      Recent Results (from the past 240 hour(s))  Resp Panel by RT-PCR (Flu A&B, Covid) Nasopharyngeal Swab      Status: None   Collection Time: 05/15/21 12:10 AM   Specimen: Nasopharyngeal Swab; Nasopharyngeal(NP) swabs in vial transport medium  Result Value Ref Range Status   SARS Coronavirus 2 by RT PCR NEGATIVE NEGATIVE Final    Comment: (NOTE) SARS-CoV-2 target nucleic acids are NOT DETECTED.  The SARS-CoV-2 RNA is generally detectable in upper respiratory specimens during the acute phase of infection. The lowest concentration of SARS-CoV-2 viral copies this assay can detect is 138 copies/mL. A negative result does not preclude SARS-Cov-2 infection and should not be used as the sole basis for treatment or other patient management decisions. A negative result may occur with  improper specimen collection/handling, submission of specimen other than nasopharyngeal swab, presence of viral mutation(s) within the areas targeted by this assay, and inadequate number of viral copies(<138 copies/mL). A negative result must be combined with clinical observations, patient history, and epidemiological information. The expected result is Negative.  Fact Sheet for Patients:  EntrepreneurPulse.com.au  Fact Sheet for Healthcare Providers:  IncredibleEmployment.be  This test is no t yet approved or cleared by the [place] FDA and  has been authorized for detection and/or diagnosis of SARS-CoV-2 by FDA under an Emergency Use Authorization (EUA). This EUA will remain  in effect (meaning this test can be used) for the duration of the COVID-19 declaration under Section 564(b)(1) of the Act, 21 U.S.C.section 360bbb-3(b)(1), unless the authorization is terminated  or revoked sooner.       Influenza A by PCR NEGATIVE NEGATIVE Final   Influenza B by PCR NEGATIVE NEGATIVE Final    Comment: (NOTE) The Xpert Xpress SARS-CoV-2/FLU/RSV plus assay is intended as an aid in the diagnosis of influenza from Nasopharyngeal swab specimens and should not be used as a sole basis  for treatment. Nasal washings and aspirates are unacceptable for Xpert Xpress SARS-CoV-2/FLU/RSV testing.  Fact Sheet for Patients: EntrepreneurPulse.com.au  Fact Sheet for Healthcare Providers: IncredibleEmployment.be  This test is not yet approved or cleared by the [place] FDA and has been authorized for detection and/or diagnosis of SARS-CoV-2 by FDA under an Emergency Use Authorization (EUA). This EUA will remain in effect (meaning this test can be used) for the duration of the COVID-19 declaration under Section 564(b)(1) of the Act, 21 U.S.C. section 360bbb-3(b)(1), unless the  authorization is terminated or revoked.  Performed at [facility], [place] [address] [place], [place] [address]        Radiology Studies: CT CHEST ABDOMEN PELVIS WO CONTRAST  Result Date: 05/19/2021 CLINICAL DATA:  Evaluate for metastatic disease EXAM: CT CHEST, ABDOMEN AND PELVIS WITHOUT CONTRAST TECHNIQUE: Multidetector CT imaging of the chest, abdomen and pelvis was performed following the standard protocol without IV contrast. COMPARISON:  MR abdomen done on 05/15/2021 FINDINGS: CT CHEST FINDINGS Cardiovascular: Unremarkable. Mediastinum/Nodes: No significant lymphadenopathy seen in the mediastinum. Lungs/Pleura: There is no focal pulmonary consolidation. There are no discrete lung nodules. There is no pleural effusion or pneumothorax. CT ABDOMEN PELVIS FINDINGS Hepatobiliary: Liver measures 22.7 cm in length. There are multiple ill-defined low-density foci of varying sizes in the liver suggesting hepatic metastatic disease. There is no dilation of bile ducts. Gallbladder is unremarkable. Pancreas: No focal abnormality is seen. Spleen: Spleen measures 14.4 cm in maximum diameter. Adrenals/Urinary Tract: There is 2.1 cm nodule in the left adrenal. There is mild hyperplasia of right adrenal. There is no hydronephrosis. There are no renal or ureteral stones. There  is 2.2 cm exophytic low-density lesion in the lower pole of left kidney, possibly a cyst. There is possible minimal calcification in the posterior margin of the cyst. Urinary bladder is distended without wall thickening. Stomach/Bowel: Stomach is unremarkable. Small bowel loops are not dilated. Appendix is not dilated. There is no significant wall thickening in the colon. There is no pericolic stranding. Large amount of stool is seen in the rectum. Transverse diameter of rectum measures 7.9 cm. Findings may suggest fecal impaction in the rectum. Moderate amount of stool is seen in colon. Vascular/Lymphatic: Vascular structures are essentially unremarkable. There are enlarged lymph nodes in retroperitoneum and periportal region in the upper abdomen. Largest of the nodes measures 2.5 x 1.7 cm along the posterior margin of left lobe of liver. Reproductive: There is inhomogeneous attenuation in the enlarged uterus suggesting uterine fibroids. Other: There is no ascites or pneumoperitoneum. Musculoskeletal: Degenerative changes are noted in the lower cervical spine with spinal stenosis and encroachment of neural foramina. There is minimal anterolisthesis at C7-T1 level. There is first-degree anterolisthesis at L4-L5 level. Spinal stenosis and encroachment of neural foramina is seen at L4-L5 level. Schmorl's node is seen in the upper endplate of body of F75 vertebra. No definite focal lytic or lesions are seen. IMPRESSION: Study was done without intravenous contrast limiting evaluation. Enlarged liver with multiple space-occupying lesions of varying sizes consistent with extensive hepatic metastatic disease. Enlarged spleen. There are abnormally enlarged lymph nodes in the retroperitoneum and periportal region in the epigastrium suggesting possible metastatic disease. There is 2.1 cm nodule in the left adrenal which may suggest incidental adenoma or metastatic disease. There is no significant lymphadenopathy in the  mediastinum. There are no discrete lung nodules. There are no focal lytic or sclerotic lesions in bony structures. Cervical spondylosis with spinal stenosis and encroachment of neural foramina in the visualized lower cervical spine. Lumbar spondylosis with spinal stenosis and encroachment of neural foramina at L4-L5 level. Enlarged uterus with possible fibroids. There is fecal impaction in the rectum. Other findings as described in the body of the report. Electronically Signed   By: Elmer Picker M.D.   On: 05/19/2021 14:06       LOS: 5 days   [facility] Pager on [email or web address]  05/20/2021, 11:20 AM

## 2021-05-20 NOTE — Progress Notes (Signed)
OT Cancellation Note  Patient Details Name: Megan Miranda MRN: [id number] DOB: 1960/11/07   Cancelled Treatment:    Reason Eval/Treat Not Completed: Other (comment). Pt declined therapy this morning. Pt currently hyper-focused on upcoming biopsy, appears anxious. Plan to reattempt at a later time/date.   Tyrone Schimke, OT Acute Rehabilitation Services Pager: [phone] Office: [phone]  05/20/2021, 9:21 AM

## 2021-05-20 NOTE — Progress Notes (Signed)
Per primary RN, pt is refusing all medical procedures. After speaking with pts brother [name] (guardian), he wishes to hold off on the procedure at this time. MD will notify IR if pt and family wishes to proceed with liver lesion biopsy.   Narda Rutherford, AGNP-BC 05/20/2021, 12:34 PM

## 2021-05-20 NOTE — Progress Notes (Signed)
°   05/20/21 1635  Assess: MEWS Score  Temp 98.4 F (36.9 C)  BP 132/72  Pulse Rate (!) 122  Resp 17  SpO2 98 %  Assess: MEWS Score  MEWS Temp 0  MEWS Systolic 0  MEWS Pulse 2  MEWS RR 0  MEWS LOC 0  MEWS Score 2  MEWS Score Color Yellow  Assess: if the MEWS score is Yellow or Red  Were vital signs taken at a resting state? Yes  Focused Assessment Change from prior assessment (see assessment flowsheet)  Early Detection of Sepsis Score *See Row Information* Low  MEWS guidelines implemented *See Row Information* Yes  Treat  MEWS Interventions Other (Comment) (assessed)  Pain Scale 0-10  Pain Score 0  Take Vital Signs  Increase Vital Sign Frequency  Yellow: Q 2hr X 2 then Q 4hr X 2, if remains yellow, continue Q 4hrs  Escalate  MEWS: Escalate Yellow: discuss with charge nurse/RN and consider discussing with provider and RRT  Notify: Charge Nurse/RN  Name of Charge Nurse/RN Notified Candice  Date Charge Nurse/RN Notified 05/20/21  Time Charge Nurse/RN Notified 1640  Document  Patient Outcome Other (Comment) (will continue to monitor)  Progress note created (see row info) Yes

## 2021-05-20 NOTE — TOC Progression Note (Signed)
Transition of Care [facility]) - Progression Note    Patient Details  Name: Megan Miranda MRN: [id number] Date of Birth: 1961-02-12  Transition of Care [facility]) CM/SW [place], [place] Phone Number: 05/20/2021, 3:25 PM  Clinical Narrative:    CSW spoke with pt care manager about pt returning to [facility] to complete PT/OT. CSW then spoke with pt brother to confirm the pt DC plan as he is the POA. CSW contacted Alfredo Bach to inquire about pt returning to the ALF and going to PT/OT at [facility]. CSW was given the number to their services department [phone]). CSW left a message and will follow up at DC.  Expected Discharge Plan: [place] Barriers to Discharge: Continued Medical Work up, Ship broker, SNF Pending bed offer  Expected Discharge Plan and Services Expected Discharge Plan: [place] In-house Referral: Clinical Social Work   Post Acute Care Choice: [place] Living arrangements for the past 2 months: [place]                                       Social Determinants of Health (SDOH) Interventions    Readmission Risk Interventions No flowsheet data found.

## 2021-05-21 DIAGNOSIS — F05 Delirium due to known physiological condition: Secondary | ICD-10-CM | POA: Diagnosis not present

## 2021-05-21 LAB — BASIC METABOLIC PANEL WITH GFR
Anion gap: 12 (ref 5–15)
BUN: 14 mg/dL (ref 8–23)
CO2: 23 mmol/L (ref 22–32)
Calcium: 10.1 mg/dL (ref 8.9–10.3)
Chloride: 115 mmol/L — ABNORMAL HIGH (ref 98–111)
Creatinine, Ser: 2.28 mg/dL — ABNORMAL HIGH (ref 0.44–1.00)
GFR, Estimated: 24 mL/min — ABNORMAL LOW
Glucose, Bld: 173 mg/dL — ABNORMAL HIGH (ref 70–99)
Potassium: 4 mmol/L (ref 3.5–5.1)
Sodium: 150 mmol/L — ABNORMAL HIGH (ref 135–145)

## 2021-05-21 LAB — CBC
HCT: 35.2 % — ABNORMAL LOW (ref 36.0–46.0)
Hemoglobin: 10.6 g/dL — ABNORMAL LOW (ref 12.0–15.0)
MCH: 27.5 pg (ref 26.0–34.0)
MCHC: 30.1 g/dL (ref 30.0–36.0)
MCV: 91.2 fL (ref 80.0–100.0)
Platelets: 348 10*3/uL (ref 150–400)
RBC: 3.86 MIL/uL — ABNORMAL LOW (ref 3.87–5.11)
RDW: 16.4 % — ABNORMAL HIGH (ref 11.5–15.5)
WBC: 11.4 10*3/uL — ABNORMAL HIGH (ref 4.0–10.5)
nRBC: 0 % (ref 0.0–0.2)

## 2021-05-21 MED ORDER — DEXTROSE 5 % IV SOLN: INTRAVENOUS | Status: DC

## 2021-05-21 MED ORDER — FLEET ENEMA 7-19 GM/118ML RE ENEM
1.0000 | ENEMA | Freq: Once | RECTAL | Status: AC
  Administered 2021-05-21: 1 via RECTAL
  Filled 2021-05-21: qty 1

## 2021-05-21 NOTE — TOC Progression Note (Signed)
Transition of Care [facility]) - Progression Note    Patient Details  Name: Megan Miranda MRN: [id number] Date of Birth: 1961-01-22  Transition of Care [facility]) CM/SW Contact  Reece Agar, [place] Phone Number: 05/21/2021, 4:48 PM  Clinical Narrative:    CSW spoke with Alfredo Bach who is still willing to take pt with out patient PT/OT orders before pt DC. CSW will continue to follow for DC planning needs.    Expected Discharge Plan: [place] Barriers to Discharge: Continued Medical Work up, Ship broker, SNF Pending bed offer  Expected Discharge Plan and Services Expected Discharge Plan: [place] In-house Referral: Clinical Social Work   Post Acute Care Choice: [place] Living arrangements for the past 2 months: [place]                                       Social Determinants of Health (SDOH) Interventions    Readmission Risk Interventions No flowsheet data found.

## 2021-05-21 NOTE — Progress Notes (Signed)
PROGRESS NOTE    Megan Miranda  IOE:[id number] DOB: 1960-07-07 DOA: 05/14/2021 PCP: Patient, No Pcp Per (Inactive)   Brief Narrative: 60 year old with past medical history significant for bipolar 1 disorder, schizoaffective disorder, CKD stage IIIb, who presents to the ED for evaluation of generalized weakness/unstable gait.  Over a period of few days prior to admission she has been feeling weaker than usual.  Patient was found to have acute kidney injury.  MRI of the brain did not show any acute findings.  Patient was hospitalized for further management.  Incidentally found to have liver lesion on renal ultrasound.  This prompted MRI of the liver which raise concern for metastatic disease.  He was subsequently found that patient was deemed incompetent in 2020 and her brother is her guardian.  Liver biopsy being pursued with patient is declining    Assessment & Plan:   Principal Problem:   Delirium due to another medical condition Active Problems:   Bipolar I disorder ([place])   Acute kidney injury superimposed on chronic kidney disease ([place])   Generalized weakness   AKI (acute kidney injury) ([place])   1-Acute kidney injury superimposed on chronic kidney disease a stage IIIb; -Creatinine baseline  appears to be 1.4--1.6 Felt to be dehydrated on admission, creatinine peaked to 2.6 -cr increase to 2, plan to increase IV fluids.  -Renal ultrasound negative for hydronephrosis  2-Hypernatremia; Sodium continues to be elevated Increase free water, discussed with patient and nurse staff Plan  to increase D5 IV fluids   3-Liver lesion concerning for metastatic process, abnormal liver function test: MRI revealed  multiple lesions in the liver concerning for metastatic process. -CEA elevated at 21.  CA 19-9 alpha-fetoprotein normal -CT abdomen and pelvis did not show primary. Uterine fibroid.  -Dr. Pricilla Handler discussed with patient's brother, apparently patient has been adamantly refusing  biopsy.  Plan is to hold on liver biopsy for now.  Family will speak with patient's to convince her.  If Patient  still continue to refuse them plan is to stabilize her and send her to a SNF and involve palliative care in the community -Brother is legal guardian.   4-fecal impaction with leakage: CT scan show evidence of fecal impaction in the rectal area Significant bowel movement with Fleet enema yesterday. Plan to repeat Fleet enema and might need manual disimpaction.  Discussed with nurse  5-Generalized weakness/unsteady gait: -MRI of the brain did not show any acute finding. CK mildly elevated, B12 1110.  Folic acid normal TSH normal. Weakness improving. She will need skilled nursing facility for short-term  6-History of bipolar disorder/schizoaffective disorder: Continue with Lamictal, Zyprexa and Ativan Was evaluated by psychiatric, they have signed off  Normocytic anemia: Anemia panel unremarkable for any deficiency.  Monitor hemoglobin  Urine retention: Patient required in and out catheterization twice during this hospitalization.  She was a started on Flomax.  She has been able to void on her own.  Left  renal cyst: Noted on renal ultrasound      Estimated body mass index is 20.32 kg/m as calculated from the following:   Height as of this encounter: 5\' 5"  (1.651 m).   Weight as of this encounter: 55.4 kg.   DVT prophylaxis: Heparin  Code Status: Full code Family Communication: care discussed with patient Disposition Plan:  Status is: Inpatient  Remains inpatient appropriate because: plan to continue with IV fluids for hypernatremia      Consultants:  IR  Procedures:    Antimicrobials:  Subjective: She is alert, confuse.   Objective: Vitals:   05/20/21 0522 05/20/21 0922 05/20/21 1635 05/21/21 0948  BP: 121/74 136/74 132/72 (!) 98/59  Pulse: 100 (!) 110 (!) 122 (!) 110  Resp: 18 18 17 17   Temp: 98.2 F (36.8 C) 97.8 F (36.6 C) 98.4 F  (36.9 C) 97.7 F (36.5 C)  TempSrc: Oral Oral Oral Oral  SpO2: 99% 96% 98% 93%  Weight:      Height:        Intake/Output Summary (Last 24 hours) at 05/21/2021 1448 Last data filed at 05/21/2021 1015 Gross per 24 hour  Intake 2200 ml  Output --  Net 2200 ml   Filed Weights   05/14/21 1758 05/16/21 0534 05/17/21 0437  Weight: 55.8 kg 53 kg 55.4 kg    Examination:  General exam: Appears calm and comfortable  Respiratory system: Clear to auscultation. Respiratory effort normal. Cardiovascular system: S1 & S2 heard, RRR. No JVD, murmurs, rubs, gallops or clicks. No pedal edema. Gastrointestinal system: Abdomen is nondistended, soft and nontender. No organomegaly or masses felt. Normal bowel sounds heard. Central nervous system: Alert .  Extremities: Symmetric 5 x 5 power.   Data Reviewed: I have personally reviewed following labs and imaging studies  CBC: Recent Labs  Lab 05/14/21 1811 05/15/21 0520 05/16/21 0325 05/18/21 0951 05/19/21 0444 05/21/21 0745  WBC 13.1* 9.6 6.8 6.8 8.0 11.4*  NEUTROABS 10.5*  --   --   --   --   --   HGB 11.1* 9.9* 9.1* 8.9* 8.9* 10.6*  HCT 35.2* 31.5* 29.3* 29.0* 29.5* 35.2*  MCV 89.1 89.0 87.7 89.8 91.3 91.2  PLT 361 248 226 261 249 970   Basic Metabolic Panel: Recent Labs  Lab 05/17/21 0309 05/18/21 0951 05/19/21 0444 05/20/21 0433 05/21/21 0745  NA 141 152* 149* 151* 150*  K 3.6 4.2 4.1 4.5 4.0  CL 114* 125* 120* 121* 115*  CO2 20* 21* 20* 19* 23  GLUCOSE 108* 162* 102* 117* 173*  BUN 10 8 9 14 14   CREATININE 1.65* 1.75* 1.60* 1.97* 2.28*  CALCIUM 8.4* 9.0 8.9 9.4 10.1   GFR: Estimated Creatinine Clearance: 22.7 mL/min (A) (by C-G formula based on SCr of 2.28 mg/dL (H)). Liver Function Tests: Recent Labs  Lab 05/16/21 0325 05/17/21 0309 05/18/21 0951 05/19/21 0444 05/20/21 0433  AST 75* 59* 52* 59* 53*  ALT 45* 45* 42 45* 42  ALKPHOS 144* 148* 143* 145* 162*  BILITOT 0.6 0.5 0.5 0.3 0.6  PROT 5.4* 5.6* 5.9*  5.7* 6.1*  ALBUMIN 2.5* 2.6* 2.6* 2.6* 2.9*   No results for input(s): LIPASE, AMYLASE in the last 168 hours. Recent Labs  Lab 05/17/21 0309  AMMONIA 33   Coagulation Profile: Recent Labs  Lab 05/20/21 0433  INR 1.1   Cardiac Enzymes: Recent Labs  Lab 05/15/21 0952  CKTOTAL 630*   BNP (last 3 results) No results for input(s): PROBNP in the last 8760 hours. HbA1C: No results for input(s): HGBA1C in the last 72 hours. CBG: No results for input(s): GLUCAP in the last 168 hours. Lipid Profile: No results for input(s): CHOL, HDL, LDLCALC, TRIG, CHOLHDL, LDLDIRECT in the last 72 hours. Thyroid Function Tests: No results for input(s): TSH, T4TOTAL, FREET4, T3FREE, THYROIDAB in the last 72 hours. Anemia Panel: No results for input(s): VITAMINB12, FOLATE, FERRITIN, TIBC, IRON, RETICCTPCT in the last 72 hours. Sepsis Labs: No results for input(s): PROCALCITON, LATICACIDVEN in the last 168 hours.  Recent Results (from the  past 240 hour(s))  Resp Panel by RT-PCR (Flu A&B, Covid) Nasopharyngeal Swab     Status: None   Collection Time: 05/15/21 12:10 AM   Specimen: Nasopharyngeal Swab; Nasopharyngeal(NP) swabs in vial transport medium  Result Value Ref Range Status   SARS Coronavirus 2 by RT PCR NEGATIVE NEGATIVE Final    Comment: (NOTE) SARS-CoV-2 target nucleic acids are NOT DETECTED.  The SARS-CoV-2 RNA is generally detectable in upper respiratory specimens during the acute phase of infection. The lowest concentration of SARS-CoV-2 viral copies this assay can detect is 138 copies/mL. A negative result does not preclude SARS-Cov-2 infection and should not be used as the sole basis for treatment or other patient management decisions. A negative result may occur with  improper specimen collection/handling, submission of specimen other than nasopharyngeal swab, presence of viral mutation(s) within the areas targeted by this assay, and inadequate number of viral copies(<138  copies/mL). A negative result must be combined with clinical observations, patient history, and epidemiological information. The expected result is Negative.  Fact Sheet for Patients:  EntrepreneurPulse.com.au  Fact Sheet for Healthcare Providers:  IncredibleEmployment.be  This test is no t yet approved or cleared by the [place] FDA and  has been authorized for detection and/or diagnosis of SARS-CoV-2 by FDA under an Emergency Use Authorization (EUA). This EUA will remain  in effect (meaning this test can be used) for the duration of the COVID-19 declaration under Section 564(b)(1) of the Act, 21 U.S.C.section 360bbb-3(b)(1), unless the authorization is terminated  or revoked sooner.       Influenza A by PCR NEGATIVE NEGATIVE Final   Influenza B by PCR NEGATIVE NEGATIVE Final    Comment: (NOTE) The Xpert Xpress SARS-CoV-2/FLU/RSV plus assay is intended as an aid in the diagnosis of influenza from Nasopharyngeal swab specimens and should not be used as a sole basis for treatment. Nasal washings and aspirates are unacceptable for Xpert Xpress SARS-CoV-2/FLU/RSV testing.  Fact Sheet for Patients: EntrepreneurPulse.com.au  Fact Sheet for Healthcare Providers: IncredibleEmployment.be  This test is not yet approved or cleared by the [place] FDA and has been authorized for detection and/or diagnosis of SARS-CoV-2 by FDA under an Emergency Use Authorization (EUA). This EUA will remain in effect (meaning this test can be used) for the duration of the COVID-19 declaration under Section 564(b)(1) of the Act, 21 U.S.C. section 360bbb-3(b)(1), unless the authorization is terminated or revoked.  Performed at [facility], [place] [address] [place], [place] [address]          Radiology Studies: No results found.      Scheduled Meds:  heparin  5,000 Units Subcutaneous Q8H   lamoTRIgine   200 mg Oral Daily   LORazepam  1 mg Oral BID   nicotine  14 mg Transdermal Daily   OLANZapine  10 mg Oral BID   sodium phosphate  1 enema Rectal Once   tamsulosin  0.4 mg Oral Daily   Continuous Infusions:  dextrose 100 mL/hr at 05/21/21 1001     LOS: 6 days    Time spent: 35 minutes    Leahna Hewson A Thurmon Mizell, MD [facility]   If 7PM-7AM, please contact night-coverage [email or web address]  05/21/2021, 2:48 PM

## 2021-05-22 DIAGNOSIS — F05 Delirium due to known physiological condition: Secondary | ICD-10-CM | POA: Diagnosis not present

## 2021-05-22 LAB — CBC
HCT: 33 % — ABNORMAL LOW (ref 36.0–46.0)
Hemoglobin: 10.4 g/dL — ABNORMAL LOW (ref 12.0–15.0)
MCH: 28.3 pg (ref 26.0–34.0)
MCHC: 31.5 g/dL (ref 30.0–36.0)
MCV: 89.9 fL (ref 80.0–100.0)
Platelets: 277 10*3/uL (ref 150–400)
RBC: 3.67 MIL/uL — ABNORMAL LOW (ref 3.87–5.11)
RDW: 15.9 % — ABNORMAL HIGH (ref 11.5–15.5)
WBC: 10.6 10*3/uL — ABNORMAL HIGH (ref 4.0–10.5)
nRBC: 0 % (ref 0.0–0.2)

## 2021-05-22 LAB — BASIC METABOLIC PANEL
Anion gap: 11 (ref 5–15)
BUN: 14 mg/dL (ref 8–23)
CO2: 22 mmol/L (ref 22–32)
Calcium: 9.5 mg/dL (ref 8.9–10.3)
Chloride: 110 mmol/L (ref 98–111)
Creatinine, Ser: 2.15 mg/dL — ABNORMAL HIGH (ref 0.44–1.00)
GFR, Estimated: 26 mL/min — ABNORMAL LOW (ref >60)
Glucose, Bld: 119 mg/dL — ABNORMAL HIGH (ref 70–99)
Potassium: 4 mmol/L (ref 3.5–5.1)
Sodium: 143 mmol/L (ref 135–145)

## 2021-05-22 MED ORDER — LACTULOSE 10 GM/15ML PO SOLN
20.0000 g | Freq: Every day | ORAL | Status: DC
  Administered 2021-05-22 – 2021-05-27 (×6): 20 g via ORAL
  Filled 2021-05-22 (×6): qty 30

## 2021-05-22 MED ORDER — DEXTROSE 5 % IV SOLN: INTRAVENOUS | Status: AC

## 2021-05-22 NOTE — Progress Notes (Signed)
PROGRESS NOTE    Megan Miranda  GYB:[id number] DOB: December 10, 1960 DOA: 05/14/2021 PCP: Patient, No Pcp Per (Inactive)   Brief Narrative: 60 year old with past medical history significant for bipolar 1 disorder, schizoaffective disorder, CKD stage IIIb, who presents to the ED for evaluation of generalized weakness/unstable gait.  Over a period of few days prior to admission she has been feeling weaker than usual.  Patient was found to have acute kidney injury.  MRI of the brain did not show any acute findings.  Patient was hospitalized for further management.  Incidentally found to have liver lesion on renal ultrasound.  This prompted MRI of the liver which raise concern for metastatic disease.  He was subsequently found that patient was deemed incompetent in 2020 and her brother is her guardian.  Liver biopsy being pursued with patient is declining    Assessment & Plan:   Principal Problem:   Delirium due to another medical condition Active Problems:   Bipolar I disorder ([place])   Acute kidney injury superimposed on chronic kidney disease ([place])   Generalized weakness   AKI (acute kidney injury) ([place])   1-Acute kidney injury superimposed on chronic kidney disease a stage IIIb; -Creatinine baseline  appears to be 1.4--1.6 -Felt to be dehydrated on admission, creatinine peaked to 2.6 -Renal ultrasound negative for hydronephrosis -Cr down to 2.1. plan to continue with IV fluids.   2-Hypernatremia: Sodium continues to be elevated Increase free water, discussed with patient and nurse staff Resolved with D 5 IV fluids.    3-Liver lesion concerning for metastatic process, abnormal liver function test: MRI revealed  multiple lesions in the liver concerning for metastatic process. -CEA elevated at 21.  CA 19-9 alpha-fetoprotein normal -CT abdomen and pelvis did not show primary. Uterine fibroid.  -Dr. Pricilla Handler discussed with patient's brother, apparently patient has been adamantly refusing  biopsy.  Plan is to hold on liver biopsy for now.  Family will speak with patient's to convince her.  If Patient  still continue to refuse them plan is to stabilize her and send her to a SNF and involve palliative care in the community -Brother is legal guardian.  -Plan to refer to hematology oncology clinic.  - 4-fecal impaction with leakage: CT scan show evidence of fecal impaction in the rectal area Significant bowel movement with Fleet enema yesterday. Plan to repeat Fleet enema and might need manual disimpaction.  Discussed with nurse Had BM last night and this morning.   5-Generalized weakness/unsteady gait: -MRI of the brain did not show any acute finding. CK mildly elevated, B12 1110.  Folic acid normal TSH normal. Weakness improving. She will need skilled nursing facility for short-term  6-History of bipolar disorder/schizoaffective disorder: Continue with Lamictal, Zyprexa and Ativan Was evaluated by psychiatric, they have signed off  Normocytic anemia: Anemia panel unremarkable for any deficiency.  Monitor hemoglobin  Urine retention: Patient required in and out catheterization twice during this hospitalization.  She was a started on Flomax.  She has been able to void on her own.  Left  renal cyst: Noted on renal ultrasound      Estimated body mass index is 20.32 kg/m as calculated from the following:   Height as of this encounter: 5\' 5"  (1.651 m).   Weight as of this encounter: 55.4 kg.   DVT prophylaxis: Heparin  Code Status: Full code Family Communication: care discussed with patient and brother Disposition Plan:  Status is: Inpatient  Remains inpatient appropriate because: plan to continue with IV  fluids for hypernatremia      Consultants:  IR  Procedures:    Antimicrobials:    Subjective: Alert, confuse  Objective: Vitals:   05/22/21 0514 05/22/21 0847 05/22/21 0930 05/22/21 1330  BP: 105/68 105/79 110/64 100/67  Pulse: 87 (!) 108 98 100   Resp: 17 17 18 16   Temp: 97.7 F (36.5 C) 98 F (36.7 C) 98.2 F (36.8 C) 98 F (36.7 C)  TempSrc: Oral Oral Oral Oral  SpO2:  99% 100% 99%  Weight:      Height:        Intake/Output Summary (Last 24 hours) at 05/22/2021 1411 Last data filed at 05/22/2021 0900 Gross per 24 hour  Intake 2012.62 ml  Output 1150 ml  Net 862.62 ml    Filed Weights   05/14/21 1758 05/16/21 0534 05/17/21 0437  Weight: 55.8 kg 53 kg 55.4 kg    Examination:  General exam: NAD Respiratory system: CTA Cardiovascular system: S 1, S 2 RRR Gastrointestinal system: BS present, soft nt Central nervous system: alert Extremities: no edema   Data Reviewed: I have personally reviewed following labs and imaging studies  CBC: Recent Labs  Lab 05/16/21 0325 05/18/21 0951 05/19/21 0444 05/21/21 0745 05/22/21 0109  WBC 6.8 6.8 8.0 11.4* 10.6*  HGB 9.1* 8.9* 8.9* 10.6* 10.4*  HCT 29.3* 29.0* 29.5* 35.2* 33.0*  MCV 87.7 89.8 91.3 91.2 89.9  PLT 226 261 249 348 740    Basic Metabolic Panel: Recent Labs  Lab 05/18/21 0951 05/19/21 0444 05/20/21 0433 05/21/21 0745 05/22/21 0109  NA 152* 149* 151* 150* 143  K 4.2 4.1 4.5 4.0 4.0  CL 125* 120* 121* 115* 110  CO2 21* 20* 19* 23 22  GLUCOSE 162* 102* 117* 173* 119*  BUN 8 9 14 14 14   CREATININE 1.75* 1.60* 1.97* 2.28* 2.15*  CALCIUM 9.0 8.9 9.4 10.1 9.5    GFR: Estimated Creatinine Clearance: 24 mL/min (A) (by C-G formula based on SCr of 2.15 mg/dL (H)). Liver Function Tests: Recent Labs  Lab 05/16/21 0325 05/17/21 0309 05/18/21 0951 05/19/21 0444 05/20/21 0433  AST 75* 59* 52* 59* 53*  ALT 45* 45* 42 45* 42  ALKPHOS 144* 148* 143* 145* 162*  BILITOT 0.6 0.5 0.5 0.3 0.6  PROT 5.4* 5.6* 5.9* 5.7* 6.1*  ALBUMIN 2.5* 2.6* 2.6* 2.6* 2.9*    No results for input(s): LIPASE, AMYLASE in the last 168 hours. Recent Labs  Lab 05/17/21 0309  AMMONIA 33    Coagulation Profile: Recent Labs  Lab 05/20/21 0433  INR 1.1     Cardiac Enzymes: No results for input(s): CKTOTAL, CKMB, CKMBINDEX, TROPONINI in the last 168 hours.  BNP (last 3 results) No results for input(s): PROBNP in the last 8760 hours. HbA1C: No results for input(s): HGBA1C in the last 72 hours. CBG: No results for input(s): GLUCAP in the last 168 hours. Lipid Profile: No results for input(s): CHOL, HDL, LDLCALC, TRIG, CHOLHDL, LDLDIRECT in the last 72 hours. Thyroid Function Tests: No results for input(s): TSH, T4TOTAL, FREET4, T3FREE, THYROIDAB in the last 72 hours. Anemia Panel: No results for input(s): VITAMINB12, FOLATE, FERRITIN, TIBC, IRON, RETICCTPCT in the last 72 hours. Sepsis Labs: No results for input(s): PROCALCITON, LATICACIDVEN in the last 168 hours.  Recent Results (from the past 240 hour(s))  Resp Panel by RT-PCR (Flu A&B, Covid) Nasopharyngeal Swab     Status: None   Collection Time: 05/15/21 12:10 AM   Specimen: Nasopharyngeal Swab; Nasopharyngeal(NP) swabs in vial transport  medium  Result Value Ref Range Status   SARS Coronavirus 2 by RT PCR NEGATIVE NEGATIVE Final    Comment: (NOTE) SARS-CoV-2 target nucleic acids are NOT DETECTED.  The SARS-CoV-2 RNA is generally detectable in upper respiratory specimens during the acute phase of infection. The lowest concentration of SARS-CoV-2 viral copies this assay can detect is 138 copies/mL. A negative result does not preclude SARS-Cov-2 infection and should not be used as the sole basis for treatment or other patient management decisions. A negative result may occur with  improper specimen collection/handling, submission of specimen other than nasopharyngeal swab, presence of viral mutation(s) within the areas targeted by this assay, and inadequate number of viral copies(<138 copies/mL). A negative result must be combined with clinical observations, patient history, and epidemiological information. The expected result is Negative.  Fact Sheet for Patients:   EntrepreneurPulse.com.au  Fact Sheet for Healthcare Providers:  IncredibleEmployment.be  This test is no t yet approved or cleared by the [place] FDA and  has been authorized for detection and/or diagnosis of SARS-CoV-2 by FDA under an Emergency Use Authorization (EUA). This EUA will remain  in effect (meaning this test can be used) for the duration of the COVID-19 declaration under Section 564(b)(1) of the Act, 21 U.S.C.section 360bbb-3(b)(1), unless the authorization is terminated  or revoked sooner.       Influenza A by PCR NEGATIVE NEGATIVE Final   Influenza B by PCR NEGATIVE NEGATIVE Final    Comment: (NOTE) The Xpert Xpress SARS-CoV-2/FLU/RSV plus assay is intended as an aid in the diagnosis of influenza from Nasopharyngeal swab specimens and should not be used as a sole basis for treatment. Nasal washings and aspirates are unacceptable for Xpert Xpress SARS-CoV-2/FLU/RSV testing.  Fact Sheet for Patients: EntrepreneurPulse.com.au  Fact Sheet for Healthcare Providers: IncredibleEmployment.be  This test is not yet approved or cleared by the [place] FDA and has been authorized for detection and/or diagnosis of SARS-CoV-2 by FDA under an Emergency Use Authorization (EUA). This EUA will remain in effect (meaning this test can be used) for the duration of the COVID-19 declaration under Section 564(b)(1) of the Act, 21 U.S.C. section 360bbb-3(b)(1), unless the authorization is terminated or revoked.  Performed at [facility], [place] [address], [place], [place] [address]           Radiology Studies: No results found.      Scheduled Meds:  heparin  5,000 Units Subcutaneous Q8H   lamoTRIgine  200 mg Oral Daily   LORazepam  1 mg Oral BID   nicotine  14 mg Transdermal Daily   OLANZapine  10 mg Oral BID   tamsulosin  0.4 mg Oral Daily   Continuous Infusions:  dextrose 75  mL/hr at 05/22/21 1250     LOS: 7 days    Time spent: 35 minutes    Raywood Wailes A Dorothy Landgrebe, MD [facility]   If 7PM-7AM, please contact night-coverage [email or web address]  05/22/2021, 2:11 PM

## 2021-05-22 NOTE — Progress Notes (Signed)
Occupational Therapy Treatment Patient Details Name: Megan Miranda MRN: [id number] DOB: 03/19/1961 Today's Date: 05/22/2021   History of present illness Megan Miranda is a 60 y.o. female who presented 05/14/2021 to the ED for evaluation of generalized weakness/unstable gait and falls where legs gave out. Found to have acute kidney injury superimposed on chronic kidney disease, MRI brain negative for acute event. Significant medical history for bipolar 1 disorder, schizoaffective disorder, CKD stage IIIb   OT comments  Patient laying crossways on bed upon arrival. Therapist recommended getting into recliner for lunch and patient agreed. Patient was min assist to sit on EOB and mod assist with Stand pivot transfer with patient attempting transfer before RW could be setup. Patient was setup for lunch and hand hygiene. Acute OT to continue to follow.    Recommendations for follow up therapy are one component of a multi-disciplinary discharge planning process, led by the attending physician.  Recommendations may be updated based on patient status, additional functional criteria and insurance authorization.    Follow Up Recommendations  Skilled nursing-short term rehab (<3 hours/day)    Assistance Recommended at Discharge Frequent or constant Supervision/Assistance  Equipment Recommendations  Other (comment) (TBD)    Recommendations for Other Services      Precautions / Restrictions Precautions Precautions: Fall Precaution Comments: impulsive Restrictions Weight Bearing Restrictions: No       Mobility Bed Mobility Overal bed mobility: Needs Assistance Bed Mobility: Supine to Sit     Supine to sit: Min assist Sit to supine: Min assist   General bed mobility comments: patient sitting crossways on bed when arrived and sat up on EOB with min assist    Transfers Overall transfer level: Needs assistance Equipment used: None Transfers: Sit to/from Stand;Bed to  chair/wheelchair/BSC Sit to Stand: Mod assist Stand pivot transfers: Mod assist         General transfer comment: Patient was mod assist for stand pivot transfer.  Patient was impulsive and began standing before RW could be setup     Balance Overall balance assessment: Needs assistance Sitting-balance support: Feet supported Sitting balance-Leahy Scale: Fair Sitting balance - Comments: min guard for safety   Standing balance support: Bilateral upper extremity supported Standing balance-Leahy Scale: Poor Standing balance comment: posterior lean                           ADL either performed or assessed with clinical judgement   ADL Overall ADL's : Needs assistance/impaired Eating/Feeding: Set up;Sitting Eating/Feeding Details (indicate cue type and reason): provided setup while up in recliner Grooming: Wash/dry face;Wash/dry hands;Set up;Cueing for sequencing;Sitting Grooming Details (indicate cue type and reason): while seated in recliner                               General ADL Comments: Patient was willing to get into recliner for lunch    Extremity/Trunk Assessment              Vision       Perception     Praxis      Cognition Arousal/Alertness: Awake/alert Behavior During Therapy: Restless;Impulsive Overall Cognitive Status: No family/caregiver present to determine baseline cognitive functioning Area of Impairment: Orientation;Memory;Attention;Safety/judgement;Following commands;Awareness;Problem solving                 Orientation Level: Disoriented to;Time;Situation Current Attention Level: Selective Memory: Decreased short-term memory;Decreased recall of precautions Following Commands:  Follows one step commands inconsistently;Follows one step commands with increased time Safety/Judgement: Decreased awareness of deficits;Decreased awareness of safety Awareness: Intellectual Problem Solving: Slow processing;Decreased  initiation;Difficulty sequencing;Requires verbal cues;Requires tactile cues General Comments: believes she is leaving today with her brother          Exercises     Shoulder Instructions       General Comments      Pertinent Vitals/ Pain       Pain Assessment: Faces Faces Pain Scale: No hurt  Home Living                                          Prior Functioning/Environment              Frequency  Min 2X/week        Progress Toward Goals  OT Goals(current goals can now be found in the care plan section)  Progress towards OT goals: Progressing toward goals  Acute Rehab OT Goals Patient Stated Goal: go home OT Goal Formulation: With patient Time For Goal Achievement: 05/29/21 Potential to Achieve Goals: Good ADL Goals Pt Will Perform Grooming: with supervision;standing;with set-up Pt Will Perform Upper Body Bathing: with set-up;with supervision;sitting;standing Pt Will Perform Lower Body Bathing: with set-up;with supervision;sit to/from stand Pt Will Perform Upper Body Dressing: with supervision;with set-up;sitting Pt Will Perform Lower Body Dressing: with set-up;with supervision;sit to/from stand Pt Will Transfer to Toilet: with supervision;ambulating;bedside commode Pt Will Perform Toileting - Clothing Manipulation and hygiene: with supervision;sit to/from stand Additional ADL Goal #1: Pt will be Independent in and OOB for basic ADLs  Plan Discharge plan remains appropriate    Co-evaluation                 AM-PAC OT "6 Clicks" Daily Activity     Outcome Measure   Help from another person eating meals?: A Little Help from another person taking care of personal grooming?: A Little Help from another person toileting, which includes using toliet, bedpan, or urinal?: A Lot Help from another person bathing (including washing, rinsing, drying)?: A Lot Help from another person to put on and taking off regular upper body clothing?: A  Lot Help from another person to put on and taking off regular lower body clothing?: Total 6 Click Score: 13    End of Session    OT Visit Diagnosis: Unsteadiness on feet (R26.81);Other abnormalities of gait and mobility (R26.89);Muscle weakness (generalized) (M62.81);Pain;Other symptoms and signs involving cognitive function   Activity Tolerance Patient tolerated treatment well   Patient Left in chair;with call bell/phone within reach;with chair alarm set   Nurse Communication Mobility status        Time: 1209-1224 OT Time Calculation (min): 15 min  Charges: OT General Charges $OT Visit: 1 Visit OT Treatments $Self Care/Home Management : 8-22 mins  Lodema Hong, [place]  Pager [phone] Office [place] 05/22/2021, 12:32 PM

## 2021-05-22 NOTE — Progress Notes (Signed)
OT Cancellation Note  Patient Details Name: Megan Miranda MRN: [id number] DOB: Jun 20, 1960   Cancelled Treatment:    Reason Eval/Treat Not Completed: Patient declined, no reason specified (Patient stated she did not want therapy today and that she was trying to get home.  Benefits of therapy explained to patient but she declined participation.) Lodema Hong, [place]  Pager [phone] Office [place] 05/22/2021, 8:40 AM

## 2021-05-22 NOTE — Plan of Care (Signed)
°  Problem: Education: Goal: Knowledge of General Education information will improve Description: Including pain rating scale, medication(s)/side effects and non-pharmacologic comfort measures Outcome: Progressing   Problem: Clinical Measurements: Goal: Ability to maintain clinical measurements within normal limits will improve Outcome: Progressing   Problem: Activity: Goal: Risk for activity intolerance will decrease Outcome: Progressing   Problem: Coping: Goal: Level of anxiety will decrease Outcome: Progressing   Problem: Elimination: Goal: Will not experience complications related to bowel motility Outcome: Progressing

## 2021-05-22 NOTE — Progress Notes (Signed)
Physical Therapy Treatment Patient Details Name: Megan Miranda MRN: [id number] DOB: 1960/09/13 Today's Date: 05/22/2021   History of Present Illness Megan Miranda is a 60 y.o. female who presented 05/14/2021 to the ED for evaluation of generalized weakness/unstable gait and falls where legs gave out. Found to have acute kidney injury superimposed on chronic kidney disease, MRI brain negative for acute event. Significant medical history for bipolar 1 disorder, schizoaffective disorder, CKD stage IIIb    PT Comments    PT entering room in response to the bed alarm going off as pt was sitting upright at EOB attempting to stand. She was very confused throughout, stating that she was leaving. She became upset and increasingly agitated; therefore, PT assisted her back into a supine position in bed. Her MD entered the room to assess her as well. Pt would continue to benefit from skilled physical therapy services at this time while admitted and after d/c to address the below listed limitations in order to improve overall safety and independence with functional mobility.    Recommendations for follow up therapy are one component of a multi-disciplinary discharge planning process, led by the attending physician.  Recommendations may be updated based on patient status, additional functional criteria and insurance authorization.  Follow Up Recommendations  Skilled nursing-short term rehab (<3 hours/day)     Assistance Recommended at Discharge Frequent or constant Supervision/Assistance  Equipment Recommendations  Other (comment) (defer to next venue of care)    Recommendations for Other Services       Precautions / Restrictions Precautions Precautions: Fall Precaution Comments: impulsive Restrictions Weight Bearing Restrictions: No     Mobility  Bed Mobility Overal bed mobility: Needs Assistance Bed Mobility: Sit to Supine       Sit to supine: Min assist   General bed mobility  comments: pt sitting upright at EOB with bed alarm going off and attempting to stand from bed upon PT arrival. She was completely unaware of the tension on her IV line. Pt required min A to bring bilateral LEs back onto bed to return to a supine position. She also required maximal verbal and tactile cueing for sequencing to scoot herself up towards the [facility]    Transfers                   General transfer comment: did not attempt due to safety concerns as pt was impulsive and confused, becoming agitated    Ambulation/Gait                   Stairs             Wheelchair Mobility    Modified Rankin (Stroke Patients Only)       Balance Overall balance assessment: Needs assistance Sitting-balance support: Feet supported Sitting balance-Leahy Scale: Fair Sitting balance - Comments: pt sitting upright at EOB upon PT arrival without any external supports                                    Cognition Arousal/Alertness: Awake/alert Behavior During Therapy: Restless;Impulsive Overall Cognitive Status: No family/caregiver present to determine baseline cognitive functioning Area of Impairment: Orientation;Memory;Attention;Safety/judgement;Following commands;Awareness;Problem solving                 Orientation Level: Disoriented to;Time;Situation Current Attention Level: Selective Memory: Decreased short-term memory;Decreased recall of precautions Following Commands: Follows one step commands inconsistently;Follows one step commands with increased  time Safety/Judgement: Decreased awareness of deficits;Decreased awareness of safety Awareness: Intellectual Problem Solving: Slow processing;Decreased initiation;Difficulty sequencing;Requires verbal cues;Requires tactile cues          Exercises      General Comments        Pertinent Vitals/Pain Pain Assessment: Faces Faces Pain Scale: No hurt    Home Living                           Prior Function            PT Goals (current goals can now be found in the care plan section) Acute Rehab PT Goals PT Goal Formulation: With patient Time For Goal Achievement: 05/29/21 Potential to Achieve Goals: Good Progress towards PT goals: Progressing toward goals    Frequency    Min 2X/week      PT Plan Current plan remains appropriate    Co-evaluation              AM-PAC PT "6 Clicks" Mobility   Outcome Measure  Help needed turning from your back to your side while in a flat bed without using bedrails?: A Little Help needed moving from lying on your back to sitting on the side of a flat bed without using bedrails?: A Lot Help needed moving to and from a bed to a chair (including a wheelchair)?: A Lot Help needed standing up from a chair using your arms (e.g., wheelchair or bedside chair)?: A Lot Help needed to walk in hospital room?: Total Help needed climbing 3-5 steps with a railing? : Total 6 Click Score: 11    End of Session   Activity Tolerance: Treatment limited secondary to agitation Patient left: in bed;with call bell/phone within reach;with bed alarm set Nurse Communication: Mobility status PT Visit Diagnosis: Other abnormalities of gait and mobility (R26.89);History of falling (Z91.81);Muscle weakness (generalized) (M62.81);Other symptoms and signs involving the nervous system (R29.898)     Time: 9381-0175 PT Time Calculation (min) (ACUTE ONLY): 10 min  Charges:  $Therapeutic Activity: 8-22 mins                     Anastasio Champion, DPT  Acute Rehabilitation Services Office [place] 05/22/2021, 10:31 AM

## 2021-05-23 DIAGNOSIS — F05 Delirium due to known physiological condition: Secondary | ICD-10-CM | POA: Diagnosis not present

## 2021-05-23 LAB — BASIC METABOLIC PANEL
Anion gap: 12 (ref 5–15)
BUN: 10 mg/dL (ref 8–23)
CO2: 21 mmol/L — ABNORMAL LOW (ref 22–32)
Calcium: 9.4 mg/dL (ref 8.9–10.3)
Chloride: 108 mmol/L (ref 98–111)
Creatinine, Ser: 2.12 mg/dL — ABNORMAL HIGH (ref 0.44–1.00)
GFR, Estimated: 26 mL/min — ABNORMAL LOW (ref >60)
Glucose, Bld: 117 mg/dL — ABNORMAL HIGH (ref 70–99)
Potassium: 3.7 mmol/L (ref 3.5–5.1)
Sodium: 141 mmol/L (ref 135–145)

## 2021-05-23 MED ORDER — LORAZEPAM 1 MG PO TABS: 1.0000 mg | ORAL_TABLET | Freq: Two times a day (BID) | ORAL | 0 refills | Status: DC

## 2021-05-23 MED ORDER — DEXTROSE 5 % IV SOLN: INTRAVENOUS | Status: DC

## 2021-05-23 MED ORDER — TAMSULOSIN HCL 0.4 MG PO CAPS: 0.4000 mg | ORAL_CAPSULE | Freq: Every day | ORAL | 1 refills | Status: DC

## 2021-05-23 MED ORDER — LACTULOSE 10 GM/15ML PO SOLN: 20.0000 g | Freq: Every day | ORAL | 0 refills | Status: DC

## 2021-05-23 MED ORDER — OLANZAPINE 20 MG PO TABS: 10.0000 mg | ORAL_TABLET | Freq: Two times a day (BID) | ORAL | 0 refills | Status: DC

## 2021-05-23 NOTE — Plan of Care (Signed)

## 2021-05-23 NOTE — Discharge Summary (Signed)
Physician Discharge Summary  Megan Miranda JJO:[id number] DOB: 05/10/1961 DOA: 05/14/2021  PCP: Patient, No Pcp Per (Inactive)  Admit date: 05/14/2021 Discharge date: 05/23/2021  Admitted From: ALF Disposition:  To be determine  Recommendations for Outpatient Follow-up:  Follow up with PCP in 1-2 weeks Please obtain BMP/CBC in one week Needs to be follow by Palliative care for further goals of care.  Needs to follow up with oncology for further discussion of liver biopsy.    Discharge Condition: Stable.  CODE STATUS: Full code Diet recommendation: Heart Healthy   Brief/Interim Summary: 60 year old with past medical history significant for bipolar 1 disorder, schizoaffective disorder, CKD stage IIIb, who presents to the ED for evaluation of generalized weakness/unstable gait.  Over a period of few days prior to admission she has been feeling weaker than usual.  Patient was found to have acute kidney injury.  MRI of the brain did not show any acute findings.  Patient was hospitalized for further management.   Incidentally found to have liver lesion on renal ultrasound.  This prompted MRI of the liver which raise concern for metastatic disease.  He was subsequently found that patient was deemed incompetent in 2020 and her brother is her guardian.  Liver biopsy being pursued with patient is declining    Discharge Diagnoses:  Principal Problem:   Delirium due to another medical condition Active Problems:   Bipolar I disorder ([place])   Acute kidney injury superimposed on chronic kidney disease ([place])   Generalized weakness   AKI (acute kidney injury) ([place])  1-Acute kidney injury superimposed on chronic kidney disease a stage IIIb; -Creatinine baseline  appears to be 1.4--1.6, but more than 2 years ago -Felt to be dehydrated on admission, creatinine peaked to 2.6 -Renal ultrasound negative for hydronephrosis -Cr has remain stable at 2.0 Ok to discharge, close follow up renal function.     2-Hypernatremia: Sodium continues to be elevated Increase free water, discussed with patient and nurse staff Resolved with D 5 IV fluids.      3-Liver lesion concerning for metastatic process, abnormal liver function test: MRI revealed  multiple lesions in the liver concerning for metastatic process. -CEA elevated at 21.  CA 19-9 alpha-fetoprotein normal -CT abdomen and pelvis did not show primary. Uterine fibroid.  -Dr. Pricilla Handler discussed with patient's brother, apparently patient has been adamantly refusing biopsy.  Plan is to hold on liver biopsy for now.  Family will speak with patient's to convince her.  If Patient  still continue to refuse them plan is to stabilize her and send her to a SNF and involve palliative care in the community -Brother is legal guardian.  -Plan to refer to hematology oncology clinic.  - 4-fecal impaction with leakage: CT scan show evidence of fecal impaction in the rectal area Significant bowel movement with Fleet enema yesterday. Plan to repeat Fleet enema and might need manual disimpaction. Discussed with nurse    5-Generalized weakness/unsteady gait: -MRI of the brain did not show any acute finding. -CK mildly elevated, B12 1110.  Folic acid normal TSH normal. -Weakness improving. -She will need skilled nursing facility for short-term   6-History of bipolar disorder/schizoaffective disorder: Continue with Lamictal, Zyprexa and Ativan Was evaluated by psychiatric, they have signed off   Normocytic anemia: Anemia panel unremarkable for any deficiency.  Monitor hemoglobin   Urine retention: Patient required in and out catheterization twice during this hospitalization.  She was a started on Flomax.  She has been able to void on her  own.   Left  renal cyst: Noted on renal ultrasound          Discharge Instructions  Discharge Instructions     Ambulatory referral to Hematology / Oncology   Complete by: As directed    Multiple liver lesions.    Diet - low sodium heart healthy   Complete by: As directed    Increase activity slowly   Complete by: As directed       Allergies as of 05/23/2021       Reactions   Bee Venom Itching   Soap Rash        Medication List     STOP taking these medications    lithium carbonate 150 MG capsule       TAKE these medications    ferrous sulfate 325 (65 FE) MG tablet Take 325 mg by mouth daily.   lactulose 10 GM/15ML solution Commonly known as: CHRONULAC Take 30 mLs (20 g total) by mouth daily. Start taking on: May 24, 2021   lamoTRIgine 200 MG tablet Commonly known as: LaMICtal Take 1 tablet (200 mg total) by mouth daily.   loratadine 10 MG tablet Commonly known as: CLARITIN Take 10 mg by mouth daily.   LORazepam 1 MG tablet Commonly known as: ATIVAN Take 1 tablet (1 mg total) by mouth 2 (two) times daily.   OLANZapine 20 MG tablet Commonly known as: ZYPREXA Take 0.5 tablets (10 mg total) by mouth 2 (two) times daily. Half tablet by mouth twice daily   tamsulosin 0.4 MG Caps capsule Commonly known as: FLOMAX Take 1 capsule (0.4 mg total) by mouth daily. Start taking on: May 24, 2021        Allergies  Allergen Reactions   Bee Venom Itching   Soap Rash    Consultations: IR   Procedures/Studies: CT Head Wo Contrast  Result Date: 05/14/2021 CLINICAL DATA:  Head trauma, moderate-severe. Unsteady gait. Pt fell today, leg weakness EXAM: CT HEAD WITHOUT CONTRAST TECHNIQUE: Contiguous axial images were obtained from the base of the skull through the vertex without intravenous contrast. COMPARISON:  CT head 10/15/2017 BRAIN: BRAIN Prominence of the lateral ventricles may be related to central predominant atrophy, although a component of normal pressure/communicating hydrocephalus cannot be excluded. No evidence of large-territorial acute infarction. No parenchymal hemorrhage. No mass lesion. No extra-axial collection. No mass effect or midline shift.  No hydrocephalus. Basilar cisterns are patent. Vascular: No hyperdense vessel. Skull: No acute fracture or focal lesion. Sinuses/Orbits: Paranasal sinuses and mastoid air cells are clear. The orbits are unremarkable. Other: None. IMPRESSION: 1. No acute intracranial abnormality. 2. Prominence of the lateral ventricles may be related to central predominant atrophy, although a component of normal pressure/communicating hydrocephalus cannot be excluded. Electronically Signed   By: Iven Finn M.D.   On: 05/14/2021 21:05   MR BRAIN WO CONTRAST  Result Date: 05/14/2021 CLINICAL DATA:  Initial evaluation for neuro deficit, stroke suspected. CT locking glass date fetal enough locking read it dome locking read no all dysfunction read 5 deflecting look at soccer practice EXAM: MRI HEAD WITHOUT CONTRAST TECHNIQUE: Multiplanar, multiecho pulse sequences of the brain and surrounding structures were obtained without intravenous contrast. COMPARISON:  Prior CT from earlier the same day. FINDINGS: Brain: Mild diffuse prominence of the CSF containing spaces compatible with generalized cerebral atrophy, mildly advanced for age. No significant cerebral white matter disease. No abnormal foci of restricted diffusion to suggest acute or subacute ischemia. Gray-white matter differentiation maintained. No encephalomalacia to  suggest chronic cortical infarction. No evidence for acute or chronic intracranial hemorrhage. No mass lesion, midline shift or mass effect. Mild ventricular prominence related to global parenchymal volume loss without hydrocephalus. No extra-axial fluid collection. Pituitary gland suprasellar region within normal limits. Midline structures intact and normal. Vascular: Major intracranial vascular flow voids are maintained. Skull and upper cervical spine: Craniocervical junction within normal limits. Degenerative spondylosis noted at C4-5 and C5-6 with resultant mild to moderate spinal stenosis. Bone marrow  signal intensity within normal limits. No scalp soft tissue abnormality. Sinuses/Orbits: Globes and orbital soft tissues within normal limits. Paranasal sinuses are largely clear. Trace right mastoid effusion, of doubtful significance. Inner ear structures grossly normal. Other: None. IMPRESSION: 1. No acute intracranial infarct or other abnormality. 2. Mildly advanced cerebral atrophy for age. Electronically Signed   By: Jeannine Boga M.D.   On: 05/14/2021 22:53   MR LIVER WO CONRTAST  Addendum Date: 05/15/2021   ADDENDUM REPORT: 05/15/2021 15:34 ADDENDUM: Mild increased T2 signal in RIGHT paraspinous musculature about the lower lumbar spine at lower margin of the images submitted is of uncertain significance potentially representing myositis or early atrophy. Correlate with any symptoms in this location. These results were called by telephone at the time of interpretation on 05/15/2021 at 3:34 pm to provider [facility] , who verbally acknowledged these results. Electronically Signed   By: Zetta Bills M.D.   On: 05/15/2021 15:34   Result Date: 05/15/2021 CLINICAL DATA:  Abnormal findings on previous renal sonogram. Suspected liver lesions in a 60 year old female. EXAM: MRI ABDOMEN WITHOUT CONTRAST TECHNIQUE: Multiplanar multisequence MR imaging was performed without the administration of intravenous contrast. COMPARISON:  Renal sonogram of the same date. FINDINGS: Lower chest: No effusion. No consolidation at the lung bases. Limited assessment on MRI of this area. Hepatobiliary: Numerous large hepatic metastatic lesions, largest centered in the LEFT hepatic lobe measuring 9.2 x 6.0 cm with mixed T2 signal predominantly hypointense on T1. At least 9 additional lesions. Next largest in the dome of the RIGHT hemi liver measuring 8.3 x 7.0 cm (image 5/4) Four additional lesions which exceed 5 cm in the RIGHT hepatic lobe more inferiorly with smaller lesions elsewhere. There is both LEFT and RIGHT  hepatic lobe involvement. No biliary duct distension. No gallbladder distension or pericholecystic fluid. Mild prominence of the intrahepatic biliary tree related to extrinsic compression secondary to periportal adenopathy. Pancreas: Normal intrinsic T1 signal. No ductal dilation or sign of inflammation. No focal lesion. Spleen: Normal signal in the spleen with mild splenic enlargement approximately 13 cm greatest craniocaudal dimension. Adrenals/Urinary Tract: RIGHT adrenal gland with mild thickening contiguous with hepatic mass. Nodular thickening of the LEFT adrenal gland (image 19/2) this measures approximately 15 mm, suggestion of signal loss on out of phase T1 weighted gradient echo imaging. Mild ureteral and collecting system distension. This is seen bilaterally. Diffuse infiltration of the kidneys with small cystic lesions, diffuse nature of this process is compatible with lithium toxicity in the setting of reported bipolar disorder. Stomach/Bowel: No acute gastrointestinal process to the extent evaluated on this abdominal MRI, not well assessed. Vascular/Lymphatic: Bulky upper abdominal lymphadenopathy and periportal lymphadenopathy. (Image 15/3) 16 mm short axis lymph node in the hepatic gastric recess (Image 24/2) 17 mm periportal lymph node. (Image 26/2) 818 mm intra-aortocaval lymph node. Vascular structures are not well assessed, normal caliber on the current study. Other:  Leiomyomas partially visualized in the uterus. Musculoskeletal: No suspicious bone lesions identified. IMPRESSION: Numerous bilateral hepatic metastatic lesions largest  centered in the medial segment LEFT hepatic lobe. Bulky upper abdominal lymphadenopathy and periportal lymphadenopathy, also compatible with metastatic process. Adenopathy is confined mainly to the upper abdomen. Mild splenomegaly. Correlate with any history of risk factors for liver disease/cirrhosis. This could also be related to diffuse hepatic infiltration. Liver  is diffusely abnormal but without overt signs of cirrhosis. Mild prominence of the intrahepatic biliary tree related to extrinsic compression secondary to periportal adenopathy. Findings compatible with lithium toxicity, multiple tiny cystic areas, as involving the bilateral kidneys in this patient with reported history of bipolar disorder. Mild collecting system distension and distension of bilateral ureters, correlate with any signs of urinary retention. Urinary bladder is distended extending into the lower abdomen. Electronically Signed: By: Zetta Bills M.D. On: 05/15/2021 14:20   US RENAL  Result Date: 05/15/2021 CLINICAL DATA:  Acute renal injury, superimposed on chronic renal disease. EXAM: RENAL / URINARY TRACT ULTRASOUND COMPLETE COMPARISON:  None. FINDINGS: Right Kidney: Renal measurements: 10.6 cm x 4.2 cm x 3.6 cm = volume: 83.7 mL. Diffusely increased echogenicity of the renal parenchyma is noted. No mass or hydronephrosis visualized. Left Kidney: Renal measurements: 8.9 cm x 4.7 cm x 3.4 cm = volume: 73.8 mL. Diffusely increased echogenicity of the renal parenchyma is noted. A 2.3 cm x 1.5 cm x 1.4 cm anechoic structure is seen within the lower pole of the left kidney. No abnormal flow is noted within this region on color Doppler evaluation. No hydronephrosis is visualized. Bladder: Appears normal for degree of bladder distention. Other: Of incidental note is the presence of multiple large hyperechoic liver lesions. IMPRESSION: 1. Increased renal echogenicity, likely secondary to medical renal disease. 2. Simple renal cyst within the lower pole of the left kidney. 3. Large hyperechoic liver lesions. MRI correlation is recommended, as an underlying neoplastic process cannot be excluded. Electronically Signed   By: Virgina Norfolk M.D.   On: 05/15/2021 02:09   CT CHEST ABDOMEN PELVIS WO CONTRAST  Result Date: 05/19/2021 CLINICAL DATA:  Evaluate for metastatic disease EXAM: CT CHEST, ABDOMEN  AND PELVIS WITHOUT CONTRAST TECHNIQUE: Multidetector CT imaging of the chest, abdomen and pelvis was performed following the standard protocol without IV contrast. COMPARISON:  MR abdomen done on 05/15/2021 FINDINGS: CT CHEST FINDINGS Cardiovascular: Unremarkable. Mediastinum/Nodes: No significant lymphadenopathy seen in the mediastinum. Lungs/Pleura: There is no focal pulmonary consolidation. There are no discrete lung nodules. There is no pleural effusion or pneumothorax. CT ABDOMEN PELVIS FINDINGS Hepatobiliary: Liver measures 22.7 cm in length. There are multiple ill-defined low-density foci of varying sizes in the liver suggesting hepatic metastatic disease. There is no dilation of bile ducts. Gallbladder is unremarkable. Pancreas: No focal abnormality is seen. Spleen: Spleen measures 14.4 cm in maximum diameter. Adrenals/Urinary Tract: There is 2.1 cm nodule in the left adrenal. There is mild hyperplasia of right adrenal. There is no hydronephrosis. There are no renal or ureteral stones. There is 2.2 cm exophytic low-density lesion in the lower pole of left kidney, possibly a cyst. There is possible minimal calcification in the posterior margin of the cyst. Urinary bladder is distended without wall thickening. Stomach/Bowel: Stomach is unremarkable. Small bowel loops are not dilated. Appendix is not dilated. There is no significant wall thickening in the colon. There is no pericolic stranding. Large amount of stool is seen in the rectum. Transverse diameter of rectum measures 7.9 cm. Findings may suggest fecal impaction in the rectum. Moderate amount of stool is seen in colon. Vascular/Lymphatic: Vascular structures are essentially unremarkable.  There are enlarged lymph nodes in retroperitoneum and periportal region in the upper abdomen. Largest of the nodes measures 2.5 x 1.7 cm along the posterior margin of left lobe of liver. Reproductive: There is inhomogeneous attenuation in the enlarged uterus suggesting  uterine fibroids. Other: There is no ascites or pneumoperitoneum. Musculoskeletal: Degenerative changes are noted in the lower cervical spine with spinal stenosis and encroachment of neural foramina. There is minimal anterolisthesis at C7-T1 level. There is first-degree anterolisthesis at L4-L5 level. Spinal stenosis and encroachment of neural foramina is seen at L4-L5 level. Schmorl's node is seen in the upper endplate of body of W58 vertebra. No definite focal lytic or lesions are seen. IMPRESSION: Study was done without intravenous contrast limiting evaluation. Enlarged liver with multiple space-occupying lesions of varying sizes consistent with extensive hepatic metastatic disease. Enlarged spleen. There are abnormally enlarged lymph nodes in the retroperitoneum and periportal region in the epigastrium suggesting possible metastatic disease. There is 2.1 cm nodule in the left adrenal which may suggest incidental adenoma or metastatic disease. There is no significant lymphadenopathy in the mediastinum. There are no discrete lung nodules. There are no focal lytic or sclerotic lesions in bony structures. Cervical spondylosis with spinal stenosis and encroachment of neural foramina in the visualized lower cervical spine. Lumbar spondylosis with spinal stenosis and encroachment of neural foramina at L4-L5 level. Enlarged uterus with possible fibroids. There is fecal impaction in the rectum. Other findings as described in the body of the report. Electronically Signed   By: Elmer Picker M.D.   On: 05/19/2021 14:06    Subjective: She is alert, confuse  Discharge Exam: Vitals:   05/23/21 0533 05/23/21 0845  BP: 115/63 105/88  Pulse: 97 99  Resp: 17 18  Temp: 98.5 F (36.9 C) 99.4 F (37.4 C)  SpO2: 93% 97%     General: Pt is alert, awake, not in acute distress Cardiovascular: RRR, S1/S2 +, no rubs, no gallops Respiratory: CTA bilaterally, no wheezing, no rhonchi Abdominal: Soft, NT, ND, bowel  sounds + Extremities: no edema, no cyanosis    The results of significant diagnostics from this hospitalization (including imaging, microbiology, ancillary and laboratory) are listed below for reference.     Microbiology: Recent Results (from the past 240 hour(s))  Resp Panel by RT-PCR (Flu A&B, Covid) Nasopharyngeal Swab     Status: None   Collection Time: 05/15/21 12:10 AM   Specimen: Nasopharyngeal Swab; Nasopharyngeal(NP) swabs in vial transport medium  Result Value Ref Range Status   SARS Coronavirus 2 by RT PCR NEGATIVE NEGATIVE Final    Comment: (NOTE) SARS-CoV-2 target nucleic acids are NOT DETECTED.  The SARS-CoV-2 RNA is generally detectable in upper respiratory specimens during the acute phase of infection. The lowest concentration of SARS-CoV-2 viral copies this assay can detect is 138 copies/mL. A negative result does not preclude SARS-Cov-2 infection and should not be used as the sole basis for treatment or other patient management decisions. A negative result may occur with  improper specimen collection/handling, submission of specimen other than nasopharyngeal swab, presence of viral mutation(s) within the areas targeted by this assay, and inadequate number of viral copies(<138 copies/mL). A negative result must be combined with clinical observations, patient history, and epidemiological information. The expected result is Negative.  Fact Sheet for Patients:  EntrepreneurPulse.com.au  Fact Sheet for Healthcare Providers:  IncredibleEmployment.be  This test is no t yet approved or cleared by the [place] FDA and  has been authorized for detection and/or diagnosis of  SARS-CoV-2 by FDA under an Emergency Use Authorization (EUA). This EUA will remain  in effect (meaning this test can be used) for the duration of the COVID-19 declaration under Section 564(b)(1) of the Act, 21 U.S.C.section 360bbb-3(b)(1), unless the  authorization is terminated  or revoked sooner.       Influenza A by PCR NEGATIVE NEGATIVE Final   Influenza B by PCR NEGATIVE NEGATIVE Final    Comment: (NOTE) The Xpert Xpress SARS-CoV-2/FLU/RSV plus assay is intended as an aid in the diagnosis of influenza from Nasopharyngeal swab specimens and should not be used as a sole basis for treatment. Nasal washings and aspirates are unacceptable for Xpert Xpress SARS-CoV-2/FLU/RSV testing.  Fact Sheet for Patients: EntrepreneurPulse.com.au  Fact Sheet for Healthcare Providers: IncredibleEmployment.be  This test is not yet approved or cleared by the [place] FDA and has been authorized for detection and/or diagnosis of SARS-CoV-2 by FDA under an Emergency Use Authorization (EUA). This EUA will remain in effect (meaning this test can be used) for the duration of the COVID-19 declaration under Section 564(b)(1) of the Act, 21 U.S.C. section 360bbb-3(b)(1), unless the authorization is terminated or revoked.  Performed at [facility], [place] [address], [place], [place] [address]      Labs: BNP (last 3 results) No results for input(s): BNP in the last 8760 hours. Basic Metabolic Panel: Recent Labs  Lab 05/19/21 0444 05/20/21 0433 05/21/21 0745 05/22/21 0109 05/23/21 0724  NA 149* 151* 150* 143 141  K 4.1 4.5 4.0 4.0 3.7  CL 120* 121* 115* 110 108  CO2 20* 19* 23 22 21*  GLUCOSE 102* 117* 173* 119* 117*  BUN 9 14 14 14 10   CREATININE 1.60* 1.97* 2.28* 2.15* 2.12*  CALCIUM 8.9 9.4 10.1 9.5 9.4   Liver Function Tests: Recent Labs  Lab 05/17/21 0309 05/18/21 0951 05/19/21 0444 05/20/21 0433  AST 59* 52* 59* 53*  ALT 45* 42 45* 42  ALKPHOS 148* 143* 145* 162*  BILITOT 0.5 0.5 0.3 0.6  PROT 5.6* 5.9* 5.7* 6.1*  ALBUMIN 2.6* 2.6* 2.6* 2.9*   No results for input(s): LIPASE, AMYLASE in the last 168 hours. Recent Labs  Lab 05/17/21 0309  AMMONIA 33   CBC: Recent Labs   Lab 05/18/21 0951 05/19/21 0444 05/21/21 0745 05/22/21 0109  WBC 6.8 8.0 11.4* 10.6*  HGB 8.9* 8.9* 10.6* 10.4*  HCT 29.0* 29.5* 35.2* 33.0*  MCV 89.8 91.3 91.2 89.9  PLT 261 249 348 277   Cardiac Enzymes: No results for input(s): CKTOTAL, CKMB, CKMBINDEX, TROPONINI in the last 168 hours. BNP: Invalid input(s): POCBNP CBG: No results for input(s): GLUCAP in the last 168 hours. D-Dimer No results for input(s): DDIMER in the last 72 hours. Hgb A1c No results for input(s): HGBA1C in the last 72 hours. Lipid Profile No results for input(s): CHOL, HDL, LDLCALC, TRIG, CHOLHDL, LDLDIRECT in the last 72 hours. Thyroid function studies No results for input(s): TSH, T4TOTAL, T3FREE, THYROIDAB in the last 72 hours.  Invalid input(s): FREET3 Anemia work up No results for input(s): VITAMINB12, FOLATE, FERRITIN, TIBC, IRON, RETICCTPCT in the last 72 hours. Urinalysis    Component Value Date/Time   COLORURINE YELLOW 05/14/2021 1811   APPEARANCEUR CLEAR 05/14/2021 1811   LABSPEC 1.003 (L) 05/14/2021 1811   PHURINE 6.0 05/14/2021 1811   GLUCOSEU NEGATIVE 05/14/2021 1811   HGBUR SMALL (A) 05/14/2021 1811   BILIRUBINUR NEGATIVE 05/14/2021 1811   KETONESUR NEGATIVE 05/14/2021 1811   PROTEINUR NEGATIVE 05/14/2021 1811  NITRITE NEGATIVE 05/14/2021 1811   LEUKOCYTESUR TRACE (A) 05/14/2021 1811   Sepsis Labs Invalid input(s): PROCALCITONIN,  WBC,  LACTICIDVEN Microbiology Recent Results (from the past 240 hour(s))  Resp Panel by RT-PCR (Flu A&B, Covid) Nasopharyngeal Swab     Status: None   Collection Time: 05/15/21 12:10 AM   Specimen: Nasopharyngeal Swab; Nasopharyngeal(NP) swabs in vial transport medium  Result Value Ref Range Status   SARS Coronavirus 2 by RT PCR NEGATIVE NEGATIVE Final    Comment: (NOTE) SARS-CoV-2 target nucleic acids are NOT DETECTED.  The SARS-CoV-2 RNA is generally detectable in upper respiratory specimens during the acute phase of infection. The  lowest concentration of SARS-CoV-2 viral copies this assay can detect is 138 copies/mL. A negative result does not preclude SARS-Cov-2 infection and should not be used as the sole basis for treatment or other patient management decisions. A negative result may occur with  improper specimen collection/handling, submission of specimen other than nasopharyngeal swab, presence of viral mutation(s) within the areas targeted by this assay, and inadequate number of viral copies(<138 copies/mL). A negative result must be combined with clinical observations, patient history, and epidemiological information. The expected result is Negative.  Fact Sheet for Patients:  EntrepreneurPulse.com.au  Fact Sheet for Healthcare Providers:  IncredibleEmployment.be  This test is no t yet approved or cleared by the [place] FDA and  has been authorized for detection and/or diagnosis of SARS-CoV-2 by FDA under an Emergency Use Authorization (EUA). This EUA will remain  in effect (meaning this test can be used) for the duration of the COVID-19 declaration under Section 564(b)(1) of the Act, 21 U.S.C.section 360bbb-3(b)(1), unless the authorization is terminated  or revoked sooner.       Influenza A by PCR NEGATIVE NEGATIVE Final   Influenza B by PCR NEGATIVE NEGATIVE Final    Comment: (NOTE) The Xpert Xpress SARS-CoV-2/FLU/RSV plus assay is intended as an aid in the diagnosis of influenza from Nasopharyngeal swab specimens and should not be used as a sole basis for treatment. Nasal washings and aspirates are unacceptable for Xpert Xpress SARS-CoV-2/FLU/RSV testing.  Fact Sheet for Patients: EntrepreneurPulse.com.au  Fact Sheet for Healthcare Providers: IncredibleEmployment.be  This test is not yet approved or cleared by the [place] FDA and has been authorized for detection and/or diagnosis of SARS-CoV-2 by FDA under  an Emergency Use Authorization (EUA). This EUA will remain in effect (meaning this test can be used) for the duration of the COVID-19 declaration under Section 564(b)(1) of the Act, 21 U.S.C. section 360bbb-3(b)(1), unless the authorization is terminated or revoked.  Performed at [facility], [place] [address], [place], [place] [address]      Time coordinating discharge: 40 minutes  SIGNED:   Elmarie Shiley, MD  [facility]

## 2021-05-23 NOTE — Progress Notes (Signed)
Occupational Therapy Treatment Patient Details Name: Megan Miranda MRN: [id number] DOB: May 22, 1961 Today's Date: 05/23/2021   History of present illness Megan Miranda is a 60 y.o. female who presented 05/14/2021 to the ED for evaluation of generalized weakness/unstable gait and falls where legs gave out. Found to have acute kidney injury superimposed on chronic kidney disease, MRI brain negative for acute event. Significant medical history for bipolar 1 disorder, schizoaffective disorder, CKD stage IIIb   OT comments  Pt making slow progress with functional goals. Pt in bed upon arrival, restless and asking for a cup of ice and a cup of water separately. Session focused on sitting EOB, UB dressing to don clean gown, sit - stand with RW, SPT to [facility], clothing mgt and SPT back to bed. Pt still confused and with Poor safety awareness/lack of awareness of deficits. OT will continue to follow acutely to maximize level of function and safety   Recommendations for follow up therapy are one component of a multi-disciplinary discharge planning process, led by the attending physician.  Recommendations may be updated based on patient status, additional functional criteria and insurance authorization.    Follow Up Recommendations  Skilled nursing-short term rehab (<3 hours/day)    Assistance Recommended at Discharge Frequent or constant Supervision/Assistance  Equipment Recommendations  Other (comment) (TBD at SNF)    Recommendations for Other Services      Precautions / Restrictions Precautions Precautions: Fall Precaution Comments: impulsive Restrictions Weight Bearing Restrictions: No       Mobility Bed Mobility Overal bed mobility: Needs Assistance Bed Mobility: Supine to Sit;Sit to Supine     Supine to sit: Min assist Sit to supine: Min assist        Transfers Overall transfer level: Needs assistance Equipment used: Rolling walker (2 wheels) Transfers: Sit to/from Stand;Bed to  chair/wheelchair/BSC Sit to Stand: Mod assist Stand pivot transfers: Mod assist               Balance Overall balance assessment: Needs assistance Sitting-balance support: Feet supported Sitting balance-Leahy Scale: Fair Sitting balance - Comments: min A for safety Postural control: Posterior lean Standing balance support: Bilateral upper extremity supported;During functional activity Standing balance-Leahy Scale: Poor                             ADL either performed or assessed with clinical judgement   ADL Overall ADL's : Needs assistance/impaired Eating/Feeding: Set up;Sitting Eating/Feeding Details (indicate cue type and reason): seated EOB Grooming: Wash/dry face;Wash/dry hands;Set up;Cueing for sequencing;Sitting           Upper Body Dressing : Moderate assistance;Sitting Upper Body Dressing Details (indicate cue type and reason): posterior lean     Toilet Transfer: Moderate assistance;Stand-pivot;BSC/3in1;Rolling walker (2 wheels);Cueing for safety;Cueing for sequencing   Toileting- Clothing Manipulation and Hygiene: Maximal assistance;Sit to/from stand       Functional mobility during ADLs: Moderate assistance;Rolling walker (2 wheels);Cueing for safety;Cueing for sequencing      Extremity/Trunk Assessment Upper Extremity Assessment Upper Extremity Assessment: Generalized weakness   Lower Extremity Assessment Lower Extremity Assessment: Defer to PT evaluation   Cervical / Trunk Assessment Cervical / Trunk Assessment: Kyphotic    Vision Patient Visual Report: No change from baseline     Perception     Praxis      Cognition Arousal/Alertness: Awake/alert Behavior During Therapy: Restless;Impulsive Overall Cognitive Status: No family/caregiver present to determine baseline cognitive functioning Area of Impairment: Orientation;Memory;Attention;Safety/judgement;Following commands;Awareness;Problem  solving                  Orientation Level: Disoriented to;Time;Situation   Memory: Decreased short-term memory;Decreased recall of precautions Following Commands: Follows one step commands inconsistently;Follows one step commands with increased time Safety/Judgement: Decreased awareness of deficits;Decreased awareness of safety   Problem Solving: Slow processing;Decreased initiation;Difficulty sequencing;Requires verbal cues;Requires tactile cues            Exercises     Shoulder Instructions       General Comments      Pertinent Vitals/ Pain       Pain Assessment: No/denies pain Pain Score: 0-No pain Pain Intervention(s): Monitored during session;Repositioned  Home Living                                          Prior Functioning/Environment              Frequency  Min 2X/week        Progress Toward Goals  OT Goals(current goals can now be found in the care plan section)  Progress towards OT goals: Progressing toward goals     Plan Discharge plan remains appropriate    Co-evaluation                 AM-PAC OT "6 Clicks" Daily Activity     Outcome Measure   Help from another person eating meals?: None Help from another person taking care of personal grooming?: A Little Help from another person toileting, which includes using toliet, bedpan, or urinal?: A Lot Help from another person bathing (including washing, rinsing, drying)?: A Lot Help from another person to put on and taking off regular upper body clothing?: A Lot Help from another person to put on and taking off regular lower body clothing?: Total 6 Click Score: 14    End of Session Equipment Utilized During Treatment: Gait belt;Rolling walker (2 wheels);Other (comment) (BSC)  OT Visit Diagnosis: Unsteadiness on feet (R26.81);Other abnormalities of gait and mobility (R26.89);Muscle weakness (generalized) (M62.81);Pain;Other symptoms and signs involving cognitive function   Activity Tolerance  Patient tolerated treatment well   Patient Left with call bell/phone within reach;in bed;with bed alarm set   Nurse Communication          Time: 2992-4268 OT Time Calculation (min): 18 min  Charges: OT General Charges $OT Visit: 1 Visit OT Treatments $Self Care/Home Management : 8-22 mins   Britt Bottom 05/23/2021, 1:15 PM

## 2021-05-23 NOTE — Plan of Care (Signed)
  Problem: Clinical Measurements: Goal: Ability to maintain clinical measurements within normal limits will improve Outcome: Progressing   Problem: Nutrition: Goal: Adequate nutrition will be maintained Outcome: Progressing   Problem: Safety: Goal: Ability to remain free from injury will improve Outcome: Progressing   

## 2021-05-23 NOTE — TOC Progression Note (Signed)
Transition of Care [facility]) - Progression Note    Patient Details  Name: Megan Miranda MRN: [id number] Date of Birth: Jul 13, 1960  Transition of Care [facility]) CM/SW [place], [place] Phone Number: 05/23/2021, 2:36 PM  Clinical Narrative:     CSW spoke with [place] advised the faciliy of therapy recommendations and advised previous CSW notes indicates facility if agreeable to the patient returning. She states the patient would need assessment prior to discharge to ALF. However, since recommendation was made for short term rehab at SNF, the preference is for the patient to  go to rehab before returning to ALF. She confirmed the patient can return and they will hold her place at the ALF.   CSW called patient's brother,Jim- he remains agreeable to short term rehab at SNF- his preferred SNF choice is [facility] - CSW called and sent message to [facility]- waiting on response.  PSARR remains pending- will need PSARR approval before patient can d/c to SNF.  TOC will continue to follow and assist with discharge planning.  Thurmond Butts, MSW, LCSW Clinical Social Worker    Expected Discharge Plan: Skilled Nursing Facility Barriers to Discharge: Continued Medical Work up, Ship broker, SNF Pending bed offer  Expected Discharge Plan and Services Expected Discharge Plan: [place] In-house Referral: Clinical Social Work   Post Acute Care Choice: [place] Living arrangements for the past 2 months: [place] Expected Discharge Date: 05/23/21                                     Social Determinants of Health (SDOH) Interventions    Readmission Risk Interventions No flowsheet data found.

## 2021-05-24 DIAGNOSIS — F05 Delirium due to known physiological condition: Secondary | ICD-10-CM | POA: Diagnosis not present

## 2021-05-24 LAB — BASIC METABOLIC PANEL
Anion gap: 13 (ref 5–15)
BUN: 17 mg/dL (ref 8–23)
CO2: 22 mmol/L (ref 22–32)
Calcium: 9.7 mg/dL (ref 8.9–10.3)
Chloride: 105 mmol/L (ref 98–111)
Creatinine, Ser: 2.45 mg/dL — ABNORMAL HIGH (ref 0.44–1.00)
GFR, Estimated: 22 mL/min — ABNORMAL LOW (ref >60)
Glucose, Bld: 152 mg/dL — ABNORMAL HIGH (ref 70–99)
Potassium: 4.3 mmol/L (ref 3.5–5.1)
Sodium: 140 mmol/L (ref 135–145)

## 2021-05-24 MED ORDER — DEXTROSE 5 % IV SOLN: INTRAVENOUS | Status: AC

## 2021-05-24 NOTE — Plan of Care (Signed)
  Problem: Education: Goal: Knowledge of General Education information will improve Description: Including pain rating scale, medication(s)/side effects and non-pharmacologic comfort measures Outcome: Progressing   Problem: Clinical Measurements: Goal: Ability to maintain clinical measurements within normal limits will improve Outcome: Progressing   Problem: Activity: Goal: Risk for activity intolerance will decrease Outcome: Progressing   Problem: Nutrition: Goal: Adequate nutrition will be maintained Outcome: Progressing   Problem: Elimination: Goal: Will not experience complications related to bowel motility Outcome: Progressing   

## 2021-05-24 NOTE — Progress Notes (Signed)
PROGRESS NOTE    Megan Miranda  QVZ:[id number] DOB: 11/11/1960 DOA: 05/14/2021 PCP: Patient, No Pcp Per (Inactive)   Brief Narrative: 60 year old with past medical history significant for bipolar 1 disorder, schizoaffective disorder, CKD stage IIIb, who presents to the ED for evaluation of generalized weakness/unstable gait.  Over a period of few days prior to admission she has been feeling weaker than usual.  Patient was found to have acute kidney injury.  MRI of the brain did not show any acute findings.  Patient was hospitalized for further management.  Incidentally found to have liver lesion on renal ultrasound.  This prompted MRI of the liver which raise concern for metastatic disease.  He was subsequently found that patient was deemed incompetent in 2020 and her brother is her guardian.  Liver biopsy being pursued with patient is declining    Assessment & Plan:   Principal Problem:   Delirium due to another medical condition Active Problems:   Bipolar I disorder ([place])   Acute kidney injury superimposed on chronic kidney disease ([place])   Generalized weakness   AKI (acute kidney injury) ([place])   1-Acute kidney injury superimposed on chronic kidney disease a stage IIIb; -Creatinine baseline  appears to be 1.4--1.6 -Felt to be dehydrated on admission, creatinine peaked to 2.6 -Renal ultrasound negative for hydronephrosis -Cr down to 2.1. plan to continue with IV fluids.   2-Hypernatremia: Sodium continues to be elevated Increase free water, discussed with patient and nurse staff Resolved with D 5 IV fluids.    3-Liver lesion concerning for metastatic process, abnormal liver function test: MRI revealed  multiple lesions in the liver concerning for metastatic process. -CEA elevated at 21.  CA 19-9 alpha-fetoprotein normal -CT abdomen and pelvis did not show primary. Uterine fibroid.  -Dr. Pricilla Handler discussed with patient's brother, apparently patient has been adamantly refusing  biopsy.  Plan is to hold on liver biopsy for now.  Family will speak with patient's to convince her.  If Patient  still continue to refuse them plan is to stabilize her and send her to a SNF and involve palliative care in the community -Brother is legal guardian.  -Plan to refer to hematology oncology clinic.  - 4-fecal impaction with leakage: CT scan show evidence of fecal impaction in the rectal area Significant bowel movement with Fleet enema yesterday. Plan to repeat Fleet enema and might need manual disimpaction.  Discussed with nurse Had BM.  Continue with lactulose.   5-Generalized weakness/unsteady gait: -MRI of the brain did not show any acute finding. CK mildly elevated, B12 1110.  Folic acid normal TSH normal. Weakness improving. She will need skilled nursing facility for short-term  6-History of bipolar disorder/schizoaffective disorder: Continue with Lamictal, Zyprexa and Ativan Was evaluated by psychiatric, they have signed off  Normocytic anemia: Anemia panel unremarkable for any deficiency.  Monitor hemoglobin  Urine retention: Patient required in and out catheterization twice during this hospitalization.  She was a started on Flomax.  She has been able to void on her own.  Left  renal cyst: Noted on renal ultrasound      Estimated body mass index is 20.36 kg/m as calculated from the following:   Height as of this encounter: 5\' 5"  (1.651 m).   Weight as of this encounter: 55.5 kg.   DVT prophylaxis: Heparin  Code Status: Full code Family Communication: care discussed with patient and brother Disposition Plan:  Status is: Inpatient  Remains inpatient appropriate because: Awaiting SNF  Consultants:  IR  Procedures:    Antimicrobials:    Subjective: Alert, confuse, no new complaints.   Objective: Vitals:   05/23/21 2042 05/24/21 0419 05/24/21 0832 05/24/21 1026  BP: 109/64 126/64 102/79   Pulse: (!) 122 (!) 115 (!) 106 98  Resp: 18 18  18 20   Temp: 98.8 F (37.1 C) 98.8 F (37.1 C) 99.7 F (37.6 C)   TempSrc: Oral Oral Oral   SpO2: 97% 100% 98% 100%  Weight: 55.5 kg     Height:        Intake/Output Summary (Last 24 hours) at 05/24/2021 1420 Last data filed at 05/24/2021 0827 Gross per 24 hour  Intake 1055.05 ml  Output 1300 ml  Net -244.95 ml    Filed Weights   05/16/21 0534 05/17/21 0437 05/23/21 2042  Weight: 53 kg 55.4 kg 55.5 kg    Examination:  General exam: NAD Respiratory system: CTA Cardiovascular system: S 1, S 2 RRR Gastrointestinal system: BS present, soft nt Central nervous system: Alert Extremities: no edema   Data Reviewed: I have personally reviewed following labs and imaging studies  CBC: Recent Labs  Lab 05/18/21 0951 05/19/21 0444 05/21/21 0745 05/22/21 0109  WBC 6.8 8.0 11.4* 10.6*  HGB 8.9* 8.9* 10.6* 10.4*  HCT 29.0* 29.5* 35.2* 33.0*  MCV 89.8 91.3 91.2 89.9  PLT 261 249 348 734    Basic Metabolic Panel: Recent Labs  Lab 05/19/21 0444 05/20/21 0433 05/21/21 0745 05/22/21 0109 05/23/21 0724  NA 149* 151* 150* 143 141  K 4.1 4.5 4.0 4.0 3.7  CL 120* 121* 115* 110 108  CO2 20* 19* 23 22 21*  GLUCOSE 102* 117* 173* 119* 117*  BUN 9 14 14 14 10   CREATININE 1.60* 1.97* 2.28* 2.15* 2.12*  CALCIUM 8.9 9.4 10.1 9.5 9.4    GFR: Estimated Creatinine Clearance: 24.4 mL/min (A) (by C-G formula based on SCr of 2.12 mg/dL (H)). Liver Function Tests: Recent Labs  Lab 05/18/21 0951 05/19/21 0444 05/20/21 0433  AST 52* 59* 53*  ALT 42 45* 42  ALKPHOS 143* 145* 162*  BILITOT 0.5 0.3 0.6  PROT 5.9* 5.7* 6.1*  ALBUMIN 2.6* 2.6* 2.9*    No results for input(s): LIPASE, AMYLASE in the last 168 hours. No results for input(s): AMMONIA in the last 168 hours.  Coagulation Profile: Recent Labs  Lab 05/20/21 0433  INR 1.1    Cardiac Enzymes: No results for input(s): CKTOTAL, CKMB, CKMBINDEX, TROPONINI in the last 168 hours.  BNP (last 3 results) No results  for input(s): PROBNP in the last 8760 hours. HbA1C: No results for input(s): HGBA1C in the last 72 hours. CBG: No results for input(s): GLUCAP in the last 168 hours. Lipid Profile: No results for input(s): CHOL, HDL, LDLCALC, TRIG, CHOLHDL, LDLDIRECT in the last 72 hours. Thyroid Function Tests: No results for input(s): TSH, T4TOTAL, FREET4, T3FREE, THYROIDAB in the last 72 hours. Anemia Panel: No results for input(s): VITAMINB12, FOLATE, FERRITIN, TIBC, IRON, RETICCTPCT in the last 72 hours. Sepsis Labs: No results for input(s): PROCALCITON, LATICACIDVEN in the last 168 hours.  Recent Results (from the past 240 hour(s))  Resp Panel by RT-PCR (Flu A&B, Covid) Nasopharyngeal Swab     Status: None   Collection Time: 05/15/21 12:10 AM   Specimen: Nasopharyngeal Swab; Nasopharyngeal(NP) swabs in vial transport medium  Result Value Ref Range Status   SARS Coronavirus 2 by RT PCR NEGATIVE NEGATIVE Final    Comment: (NOTE) SARS-CoV-2 target nucleic acids are  NOT DETECTED.  The SARS-CoV-2 RNA is generally detectable in upper respiratory specimens during the acute phase of infection. The lowest concentration of SARS-CoV-2 viral copies this assay can detect is 138 copies/mL. A negative result does not preclude SARS-Cov-2 infection and should not be used as the sole basis for treatment or other patient management decisions. A negative result may occur with  improper specimen collection/handling, submission of specimen other than nasopharyngeal swab, presence of viral mutation(s) within the areas targeted by this assay, and inadequate number of viral copies(<138 copies/mL). A negative result must be combined with clinical observations, patient history, and epidemiological information. The expected result is Negative.  Fact Sheet for Patients:  EntrepreneurPulse.com.au  Fact Sheet for Healthcare Providers:  IncredibleEmployment.be  This test is no t yet  approved or cleared by the [place] FDA and  has been authorized for detection and/or diagnosis of SARS-CoV-2 by FDA under an Emergency Use Authorization (EUA). This EUA will remain  in effect (meaning this test can be used) for the duration of the COVID-19 declaration under Section 564(b)(1) of the Act, 21 U.S.C.section 360bbb-3(b)(1), unless the authorization is terminated  or revoked sooner.       Influenza A by PCR NEGATIVE NEGATIVE Final   Influenza B by PCR NEGATIVE NEGATIVE Final    Comment: (NOTE) The Xpert Xpress SARS-CoV-2/FLU/RSV plus assay is intended as an aid in the diagnosis of influenza from Nasopharyngeal swab specimens and should not be used as a sole basis for treatment. Nasal washings and aspirates are unacceptable for Xpert Xpress SARS-CoV-2/FLU/RSV testing.  Fact Sheet for Patients: EntrepreneurPulse.com.au  Fact Sheet for Healthcare Providers: IncredibleEmployment.be  This test is not yet approved or cleared by the [place] FDA and has been authorized for detection and/or diagnosis of SARS-CoV-2 by FDA under an Emergency Use Authorization (EUA). This EUA will remain in effect (meaning this test can be used) for the duration of the COVID-19 declaration under Section 564(b)(1) of the Act, 21 U.S.C. section 360bbb-3(b)(1), unless the authorization is terminated or revoked.  Performed at [facility], [place] [address], [place], [place] [address]           Radiology Studies: No results found.      Scheduled Meds:  heparin  5,000 Units Subcutaneous Q8H   lactulose  20 g Oral Daily   lamoTRIgine  200 mg Oral Daily   LORazepam  1 mg Oral BID   nicotine  14 mg Transdermal Daily   OLANZapine  10 mg Oral BID   tamsulosin  0.4 mg Oral Daily   Continuous Infusions:  dextrose 50 mL/hr at 05/24/21 1035     LOS: 9 days    Time spent: 35 minutes    Kyon Bentler A Yuleidy Rappleye, MD [facility]   If 7PM-7AM, please contact night-coverage [email or web address]  05/24/2021, 2:20 PM

## 2021-05-25 DIAGNOSIS — F05 Delirium due to known physiological condition: Secondary | ICD-10-CM | POA: Diagnosis not present

## 2021-05-25 NOTE — Progress Notes (Signed)
PROGRESS NOTE    Megan Miranda  ZOX:[id number] DOB: April 10, 1961 DOA: 05/14/2021 PCP: Patient, No Pcp Per (Inactive)   Brief Narrative: 61 year old with past medical history significant for bipolar 1 disorder, schizoaffective disorder, CKD stage IIIb, who presents to the ED for evaluation of generalized weakness/unstable gait.  Over a period of few days prior to admission she has been feeling weaker than usual.  Patient was found to have acute kidney injury.  MRI of the brain did not show any acute findings.  Patient was hospitalized for further management.  Incidentally found to have liver lesion on renal ultrasound.  This prompted MRI of the liver which raise concern for metastatic disease.  He was subsequently found that patient was deemed incompetent in 2020 and her brother is her guardian.  Liver biopsy being pursued with patient is declining    Assessment & Plan:   Principal Problem:   Delirium due to another medical condition Active Problems:   Bipolar I disorder ([place])   Acute kidney injury superimposed on chronic kidney disease ([place])   Generalized weakness   AKI (acute kidney injury) ([place])   1-Acute kidney injury superimposed on chronic kidney disease a stage IIIb; -Creatinine baseline  appears to be 1.4--1.6 -Felt to be dehydrated on admission, creatinine peaked to 2.6 -Renal ultrasound negative for hydronephrosis -cr increase today, will increase fluids. Will order bladder scan  2-Hypernatremia: Sodium continues to be elevated Increase free water, discussed with patient and nurse staff Resolved with D 5 IV fluids.    3-Liver lesion concerning for metastatic process, abnormal liver function test: MRI revealed  multiple lesions in the liver concerning for metastatic process. -CEA elevated at 21.  CA 19-9 alpha-fetoprotein normal -CT abdomen and pelvis did not show primary. Uterine fibroid.  -Dr. Pricilla Handler discussed with patient's brother, apparently patient has been  adamantly refusing biopsy.  Plan is to hold on liver biopsy for now.  Family will speak with patient's to convince her.  If Patient  still continue to refuse them plan is to stabilize her and send her to a SNF and involve palliative care in the community -Brother is legal guardian.  -Plan to refer to hematology oncology clinic.   4-fecal impaction with leakage: CT scan show evidence of fecal impaction in the rectal area Significant bowel movement with Fleet enema yesterday. Plan to repeat Fleet enema and might need manual disimpaction.  Discussed with nurse Had BM.  Continue with lactulose.   5-Generalized weakness/unsteady gait: -MRI of the brain did not show any acute finding. CK mildly elevated, B12 1110.  Folic acid normal TSH normal. Weakness improving. She will need skilled nursing facility for short-term  6-History of bipolar disorder/schizoaffective disorder: Continue with Lamictal, Zyprexa and Ativan Was evaluated by psychiatric, they have signed off  Normocytic anemia: Anemia panel unremarkable for any deficiency.  Monitor hemoglobin  Urine retention: Patient required in and out catheterization twice during this hospitalization.  She was a started on Flomax.  She has been able to void on her own.  Left  renal cyst: Noted on renal ultrasound      Estimated body mass index is 20.36 kg/m as calculated from the following:   Height as of this encounter: 5\' 5"  (1.651 m).   Weight as of this encounter: 55.5 kg.   DVT prophylaxis: Heparin  Code Status: Full code Family Communication: care discussed with patient and brother during this hospitalization  Disposition Plan:  Status is: Inpatient  Remains inpatient appropriate because: Awaiting SNF  Consultants:  IR  Procedures:    Antimicrobials:    Subjective: She is alert, no complaints. confuse  Objective: Vitals:   05/24/21 2236 05/25/21 0030 05/25/21 0439 05/25/21 0922  BP: 101/70 112/72 119/71  100/62  Pulse: (!) 115 (!) 101 (!) 110 73  Resp: 18 18 18 18   Temp: 99.1 F (37.3 C) 98.6 F (37 C) 99.4 F (37.4 C) 98.1 F (36.7 C)  TempSrc: Oral Oral Oral Oral  SpO2: 98% 97% 100% 100%  Weight:      Height:        Intake/Output Summary (Last 24 hours) at 05/25/2021 1230 Last data filed at 05/25/2021 0908 Gross per 24 hour  Intake 1997.43 ml  Output 1050 ml  Net 947.43 ml    Filed Weights   05/16/21 0534 05/17/21 0437 05/23/21 2042  Weight: 53 kg 55.4 kg 55.5 kg    Examination:  General exam: NAD Respiratory system: CTA Cardiovascular system: S 1, S 2 RRR Gastrointestinal system: BS present, soft, nt Central nervous system: alert.  Extremities: no edema   Data Reviewed: I have personally reviewed following labs and imaging studies  CBC: Recent Labs  Lab 05/19/21 0444 05/21/21 0745 05/22/21 0109  WBC 8.0 11.4* 10.6*  HGB 8.9* 10.6* 10.4*  HCT 29.5* 35.2* 33.0*  MCV 91.3 91.2 89.9  PLT 249 348 295    Basic Metabolic Panel: Recent Labs  Lab 05/20/21 0433 05/21/21 0745 05/22/21 0109 05/23/21 0724 05/24/21 1611  NA 151* 150* 143 141 140  K 4.5 4.0 4.0 3.7 4.3  CL 121* 115* 110 108 105  CO2 19* 23 22 21* 22  GLUCOSE 117* 173* 119* 117* 152*  BUN 14 14 14 10 17   CREATININE 1.97* 2.28* 2.15* 2.12* 2.45*  CALCIUM 9.4 10.1 9.5 9.4 9.7    GFR: Estimated Creatinine Clearance: 21.1 mL/min (A) (by C-G formula based on SCr of 2.45 mg/dL (H)). Liver Function Tests: Recent Labs  Lab 05/19/21 0444 05/20/21 0433  AST 59* 53*  ALT 45* 42  ALKPHOS 145* 162*  BILITOT 0.3 0.6  PROT 5.7* 6.1*  ALBUMIN 2.6* 2.9*    No results for input(s): LIPASE, AMYLASE in the last 168 hours. No results for input(s): AMMONIA in the last 168 hours.  Coagulation Profile: Recent Labs  Lab 05/20/21 0433  INR 1.1    Cardiac Enzymes: No results for input(s): CKTOTAL, CKMB, CKMBINDEX, TROPONINI in the last 168 hours.  BNP (last 3 results) No results for input(s):  PROBNP in the last 8760 hours. HbA1C: No results for input(s): HGBA1C in the last 72 hours. CBG: No results for input(s): GLUCAP in the last 168 hours. Lipid Profile: No results for input(s): CHOL, HDL, LDLCALC, TRIG, CHOLHDL, LDLDIRECT in the last 72 hours. Thyroid Function Tests: No results for input(s): TSH, T4TOTAL, FREET4, T3FREE, THYROIDAB in the last 72 hours. Anemia Panel: No results for input(s): VITAMINB12, FOLATE, FERRITIN, TIBC, IRON, RETICCTPCT in the last 72 hours. Sepsis Labs: No results for input(s): PROCALCITON, LATICACIDVEN in the last 168 hours.  No results found for this or any previous visit (from the past 240 hour(s)).         Radiology Studies: No results found.      Scheduled Meds:  heparin  5,000 Units Subcutaneous Q8H   lactulose  20 g Oral Daily   lamoTRIgine  200 mg Oral Daily   LORazepam  1 mg Oral BID   nicotine  14 mg Transdermal Daily   OLANZapine  10 mg Oral  BID   tamsulosin  0.4 mg Oral Daily   Continuous Infusions:     LOS: 10 days    Time spent: 35 minutes    Jarold Macomber A Joycelyn Liska, MD [facility]   If 7PM-7AM, please contact night-coverage [email or web address]  05/25/2021, 12:30 PM

## 2021-05-25 NOTE — Plan of Care (Signed)
Problem: Education: Goal: Knowledge of General Education information will improve Description: Including pain rating scale, medication(s)/side effects and non-pharmacologic comfort measures Outcome: Completed/Met   Problem: Clinical Measurements: Goal: Diagnostic test results will improve Outcome: Completed/Met Goal: Respiratory complications will improve Outcome: Completed/Met Goal: Cardiovascular complication will be avoided Outcome: Completed/Met   

## 2021-05-25 NOTE — Progress Notes (Signed)
°   05/25/21 1607  Vitals  Temp 98.5 F (36.9 C)  Temp Source Oral  BP 124/74  MAP (mmHg) 84  BP Location Left Leg  BP Method Automatic  Patient Position (if appropriate) Lying  Pulse Rate (!) 141  Pulse Rate Source Monitor  Resp 18  MEWS COLOR  MEWS Score Color Yellow  Oxygen Therapy  SpO2 96 %  MEWS Score  MEWS Temp 0  MEWS Systolic 0  MEWS Pulse 3  MEWS RR 0  MEWS LOC 0  MEWS Score 3   Patient appears to have an elevated pulse at this time.  Pulse taken manually by this RN and results is 100bpm.  Patient is cold and warm blankets offered to warm patient up. Will continue to monitor.

## 2021-05-26 ENCOUNTER — Inpatient Hospital Stay (HOSPITAL_COMMUNITY): Payer: BC Managed Care – PPO

## 2021-05-26 DIAGNOSIS — F05 Delirium due to known physiological condition: Secondary | ICD-10-CM | POA: Diagnosis not present

## 2021-05-26 LAB — URINALYSIS, ROUTINE W REFLEX MICROSCOPIC
Bilirubin Urine: NEGATIVE
Glucose, UA: NEGATIVE mg/dL
Ketones, ur: NEGATIVE mg/dL
Nitrite: NEGATIVE
Protein, ur: NEGATIVE mg/dL
Specific Gravity, Urine: 1.006 (ref 1.005–1.030)
WBC, UA: >50 WBC/hpf — ABNORMAL HIGH (ref 0–5)
pH: 5 (ref 5.0–8.0)

## 2021-05-26 LAB — CBC
HCT: 31.4 % — ABNORMAL LOW (ref 36.0–46.0)
Hemoglobin: 10.3 g/dL — ABNORMAL LOW (ref 12.0–15.0)
MCH: 28.3 pg (ref 26.0–34.0)
MCHC: 32.8 g/dL (ref 30.0–36.0)
MCV: 86.3 fL (ref 80.0–100.0)
Platelets: 264 10*3/uL (ref 150–400)
RBC: 3.64 MIL/uL — ABNORMAL LOW (ref 3.87–5.11)
RDW: 16.6 % — ABNORMAL HIGH (ref 11.5–15.5)
WBC: 15.8 10*3/uL — ABNORMAL HIGH (ref 4.0–10.5)
nRBC: 0 % (ref 0.0–0.2)

## 2021-05-26 LAB — BASIC METABOLIC PANEL
Anion gap: 15 (ref 5–15)
BUN: 42 mg/dL — ABNORMAL HIGH (ref 8–23)
CO2: 21 mmol/L — ABNORMAL LOW (ref 22–32)
Calcium: 9.1 mg/dL (ref 8.9–10.3)
Chloride: 101 mmol/L (ref 98–111)
Creatinine, Ser: 3.52 mg/dL — ABNORMAL HIGH (ref 0.44–1.00)
GFR, Estimated: 14 mL/min — ABNORMAL LOW (ref >60)
Glucose, Bld: 106 mg/dL — ABNORMAL HIGH (ref 70–99)
Potassium: 4.4 mmol/L (ref 3.5–5.1)
Sodium: 137 mmol/L (ref 135–145)

## 2021-05-26 LAB — CALCIUM: Calcium: 8.7 mg/dL — ABNORMAL LOW (ref 8.9–10.3)

## 2021-05-26 LAB — SODIUM, URINE, RANDOM: Sodium, Ur: 23 mmol/L

## 2021-05-26 LAB — CREATININE, URINE, RANDOM: Creatinine, Urine: 45.69 mg/dL

## 2021-05-26 LAB — CK: Total CK: 231 U/L (ref 38–234)

## 2021-05-26 LAB — PHOSPHORUS: Phosphorus: 5.4 mg/dL — ABNORMAL HIGH (ref 2.5–4.6)

## 2021-05-26 LAB — LITHIUM LEVEL: Lithium Lvl: <0.06 mmol/L — ABNORMAL LOW (ref 0.60–1.20)

## 2021-05-26 MED ORDER — SODIUM CHLORIDE 0.45 % IV BOLUS
250.0000 mL | Freq: Once | INTRAVENOUS | Status: AC
Start: 2021-05-26 — End: 2021-05-26
  Administered 2021-05-26: 250 mL via INTRAVENOUS

## 2021-05-26 MED ORDER — SODIUM CHLORIDE 0.45 % IV BOLUS
250.0000 mL | Freq: Once | INTRAVENOUS | Status: AC
  Administered 2021-05-26: 250 mL via INTRAVENOUS

## 2021-05-26 MED ORDER — DEXTROSE 5 % IV SOLN: INTRAVENOUS | Status: DC

## 2021-05-26 MED ORDER — SODIUM CHLORIDE 0.9 % IV SOLN
1.0000 g | INTRAVENOUS | Status: DC
  Administered 2021-05-26: 1 g via INTRAVENOUS
  Filled 2021-05-26: qty 10

## 2021-05-26 NOTE — Progress Notes (Signed)
PROGRESS NOTE    JACLYNN LAUMANN  YIR:[id number] DOB: Sep 09, 1960 DOA: 05/14/2021 PCP: Patient, No Pcp Per (Inactive)   Brief Narrative: 61 year old with past medical history significant for bipolar 1 disorder, schizoaffective disorder, CKD stage IIIb, who presents to the ED for evaluation of generalized weakness/unstable gait.  Over a period of few days prior to admission she has been feeling weaker than usual.  Patient was found to have acute kidney injury.  MRI of the brain did not show any acute findings.  Patient was hospitalized for further management.  Incidentally found to have liver lesion on renal ultrasound.  This prompted MRI of the liver which raise concern for metastatic disease.  He was subsequently found that patient was deemed incompetent in 2020 and her brother is her guardian.  Liver biopsy being pursued with patient is declining    Assessment & Plan:   Principal Problem:   Delirium due to another medical condition Active Problems:   Bipolar I disorder ([place])   Acute kidney injury superimposed on chronic kidney disease ([place])   Generalized weakness   AKI (acute kidney injury) ([place])   1-Acute kidney injury superimposed on chronic kidney disease a stage IIIb; -Creatinine baseline  appears to be 1.4--1.6 -Felt to be dehydrated on admission, creatinine peaked to 2.6 -She was off IV fluids yesterday. Cr worse today at 3.5. Repeated renal US 1/02; negative for hydronephrosis. Resume IV fluids. Will give IV bolus. Check UA and urine culture.  -Nephrologist has been consulted.  -I have consulted palliative care for goals of care.   2-Hypernatremia: Sodium continues to be elevated Increase free water, discussed with patient and nurse staff Resolved with D 5 IV fluids.    3-Liver lesion concerning for metastatic process, abnormal liver function test: MRI revealed  multiple lesions in the liver concerning for metastatic process. -CEA elevated at 21.  CA 19-9  alpha-fetoprotein normal -CT abdomen and pelvis did not show primary. Uterine fibroid.  -Dr. Pricilla Handler discussed with patient's brother, apparently patient has been adamantly refusing biopsy.  Plan is to hold on liver biopsy for now.  Family will speak with patient's to convince her.  If Patient  still continue to refuse them plan is to stabilize her and send her to a SNF and involve palliative care in the community -Brother is legal guardian.  -Plan to refer to hematology oncology clinic.  -Plan to consult palliative care for goals of care.   4-fecal impaction with leakage: CT scan show evidence of fecal impaction in the rectal area Significant bowel movement with Fleet enema yesterday. Plan to repeat Fleet enema and might need manual disimpaction.  Discussed with nurse Had BM.  Continue with lactulose.   5-Generalized weakness/unsteady gait: -MRI of the brain did not show any acute finding. CK mildly elevated, B12 1110.  Folic acid normal TSH normal. Weakness improving. She will need skilled nursing facility for short-term  6-History of bipolar disorder/schizoaffective disorder: Continue with Lamictal, Zyprexa and Ativan Was evaluated by psychiatric, they have signed off  Normocytic anemia: Anemia panel unremarkable for any deficiency.  Monitor hemoglobin  Urine retention: Patient required in and out catheterization twice during this hospitalization.  She was a started on Flomax.  She has been able to void on her own. Repeated bladder scan this am 400, but she was able to urinate after   Left  renal cyst: Noted on renal ultrasound  Leukocytosis; check UA, urine culture. No cough.      Estimated body mass index is  20.36 kg/m as calculated from the following:   Height as of this encounter: 5\' 5"  (1.651 m).   Weight as of this encounter: 55.5 kg.   DVT prophylaxis: Heparin  Code Status: Discussed with brother, patient wouldn't want to be resuscitated. Plan to consult  palliative care for goals of care.  Family Communication: care discussed with patient and brother  1/02 Disposition Plan:  Status is: Inpatient  Remains inpatient appropriate because: Awaiting SNF      Consultants:  IR  Procedures:    Antimicrobials:    Subjective: She denies pain, she is alert. She continue to be confused and does not wants to get Biopsy.   Objective: Vitals:   05/25/21 2015 05/26/21 0000 05/26/21 0438 05/26/21 0809  BP: 105/73 126/76 (!) 139/117 105/73  Pulse: (!) 132 (!) 126 (!) 123 (!) 124  Resp: 20 18 18 18   Temp: 98.8 F (37.1 C) 98.6 F (37 C) 98.4 F (36.9 C) 99.5 F (37.5 C)  TempSrc: Oral Oral Oral Oral  SpO2: 97% 96% 96% 97%  Weight:      Height:        Intake/Output Summary (Last 24 hours) at 05/26/2021 1151 Last data filed at 05/26/2021 1035 Gross per 24 hour  Intake 1080 ml  Output 1600 ml  Net -520 ml    Filed Weights   05/16/21 0534 05/17/21 0437 05/23/21 2042  Weight: 53 kg 55.4 kg 55.5 kg    Examination:  General exam: NAD Respiratory system: CTA Cardiovascular system: S 1, S 2 RRR Gastrointestinal system: BS present, soft, nt Central nervous system; Alert Extremities: No edema   Data Reviewed: I have personally reviewed following labs and imaging studies  CBC: Recent Labs  Lab 05/21/21 0745 05/22/21 0109 05/26/21 0810  WBC 11.4* 10.6* 15.8*  HGB 10.6* 10.4* 10.3*  HCT 35.2* 33.0* 31.4*  MCV 91.2 89.9 86.3  PLT 348 277 497    Basic Metabolic Panel: Recent Labs  Lab 05/21/21 0745 05/22/21 0109 05/23/21 0724 05/24/21 1611 05/26/21 0810  NA 150* 143 141 140 137  K 4.0 4.0 3.7 4.3 4.4  CL 115* 110 108 105 101  CO2 23 22 21* 22 21*  GLUCOSE 173* 119* 117* 152* 106*  BUN 14 14 10 17  42*  CREATININE 2.28* 2.15* 2.12* 2.45* 3.52*  CALCIUM 10.1 9.5 9.4 9.7 9.1    GFR: Estimated Creatinine Clearance: 14.7 mL/min (A) (by C-G formula based on SCr of 3.52 mg/dL (H)). Liver Function Tests: Recent Labs   Lab 05/20/21 0433  AST 53*  ALT 42  ALKPHOS 162*  BILITOT 0.6  PROT 6.1*  ALBUMIN 2.9*    No results for input(s): LIPASE, AMYLASE in the last 168 hours. No results for input(s): AMMONIA in the last 168 hours.  Coagulation Profile: Recent Labs  Lab 05/20/21 0433  INR 1.1    Cardiac Enzymes: No results for input(s): CKTOTAL, CKMB, CKMBINDEX, TROPONINI in the last 168 hours.  BNP (last 3 results) No results for input(s): PROBNP in the last 8760 hours. HbA1C: No results for input(s): HGBA1C in the last 72 hours. CBG: No results for input(s): GLUCAP in the last 168 hours. Lipid Profile: No results for input(s): CHOL, HDL, LDLCALC, TRIG, CHOLHDL, LDLDIRECT in the last 72 hours. Thyroid Function Tests: No results for input(s): TSH, T4TOTAL, FREET4, T3FREE, THYROIDAB in the last 72 hours. Anemia Panel: No results for input(s): VITAMINB12, FOLATE, FERRITIN, TIBC, IRON, RETICCTPCT in the last 72 hours. Sepsis Labs: No results for input(s):  PROCALCITON, LATICACIDVEN in the last 168 hours.  No results found for this or any previous visit (from the past 240 hour(s)).         Radiology Studies: US RENAL  Result Date: 05/26/2021 CLINICAL DATA:  Acute kidney injury. EXAM: RENAL / URINARY TRACT ULTRASOUND COMPLETE COMPARISON:  CT, 05/19/2021.  MRI, 05/15/2021. FINDINGS: Right Kidney: Renal measurements: 10.2 x 4.4 x 4.6 cm = volume: 107.7 mL. Increased parenchymal echogenicity. No mass or focal lesion. No stone or hydronephrosis. Left Kidney: Renal measurements: 10.1 x 4.6 x 4.8 cm = volume: 117.7 mL. Increased parenchymal echogenicity. Questionable echogenic mass along the superior margin. This is likely not a mass since there was no abnormality in this location on the previous CT or MRI. Lower pole cyst measuring 2.2 x 2.2 x 1.7 cm. No other masses, no stones and no hydronephrosis. Bladder: Distended.  Some dependent debris.  No wall thickening or mass. Other: None. IMPRESSION: 1.  No acute findings.  No hydronephrosis. 2. Bilateral echogenic kidneys consistent with medical renal disease. 3. 2.2 cm lower pole left renal cyst. Electronically Signed   By: Lajean Manes M.D.   On: 05/26/2021 11:29        Scheduled Meds:  heparin  5,000 Units Subcutaneous Q8H   lactulose  20 g Oral Daily   lamoTRIgine  200 mg Oral Daily   LORazepam  1 mg Oral BID   nicotine  14 mg Transdermal Daily   OLANZapine  10 mg Oral BID   tamsulosin  0.4 mg Oral Daily   Continuous Infusions:  dextrose 100 mL/hr at 05/26/21 0902   sodium chloride        LOS: 11 days    Time spent: 35 minutes    Coleton Woon A Autumnrose Yore, MD [facility]   If 7PM-7AM, please contact night-coverage [email or web address]  05/26/2021, 11:51 AM

## 2021-05-26 NOTE — TOC Progression Note (Signed)
Transition of Care [facility]) - Progression Note    Patient Details  Name: Megan Miranda MRN: [id number] Date of Birth: 28-Jul-1960  Transition of Care [facility]) CM/SW [place], [place] Phone Number: 05/26/2021, 10:17 AM  Clinical Narrative:    CSW received PASRR this morning ([id number]), and FL2 was updated. CSW followed up with [facility] as they had offered in the hub, [facility] is double checking they can accept the insurance, but do not foresee any issues. Pt is NMR, but they will follow and start auth when close to DC. CSW followed up with brother who is agreeable to [facility] and notes understanding of the update. TOC will continue to follow for DC needs.   Expected Discharge Plan: [place] Barriers to Discharge: Continued Medical Work up, Ship broker, SNF Pending bed offer  Expected Discharge Plan and Services Expected Discharge Plan: [place] In-house Referral: Clinical Social Work   Post Acute Care Choice: [place] Living arrangements for the past 2 months: [place] Expected Discharge Date: 05/23/21                                     Social Determinants of Health (SDOH) Interventions    Readmission Risk Interventions No flowsheet data found.

## 2021-05-26 NOTE — Consult Note (Signed)
Reason for Consult: Renal failure Referring Physician:  Dr. Tyrell Antonio  Chief Complaint: Weakness  Assessment/Plan: Acute kidney injury superimposed on CKD stage IIIb: Creatinine 2.68 on admission, previous baseline appears to be 1.4-1.6.  She was prerenal on admission. -Continue IV fluid hydration for now; I don't believe she is consuming much PO. -Renal ultrasound did not show hydronephrosis but the bladder was distended. -May also be secondary to ATN from [place] induced phosphate tubulopathy superimposed on prerenal + baseline CKD + advanced age. - Will also check a Li level; she has not been on Li during this hospitalization. - Fortunately no absolute indication for RRT; with all the hepatic lesions and LAD very reasonable to get palliative involved.  Generalized weakness/abnormal gait/fall at facility: Patient reports generalized weakness with falls at her facility.  No focal neurological deficit on admission.  Requiring 2 person assist to ambulate in the ED.  MRI brain negative for acute abnormality.   Bipolar 1/schizoaffective disorder: Per primary on  home Lamictal 200 mg daily, Zyprexa 10 mg twice daily, Ativan 1 mg twice daily.    HPI: Megan Miranda is an 61 y.o. female PMH bipolar disorder, HTN, CKD 3b presenting with an unstable gait and weakness from an assisted living facility. She has had falls also when her legs gave out but denied lightheadedness, dizziness, chest pain, dyspnea, nausea, vomiting. She was found in the ED to have a BUN/Cr of 15/2.68 with a baseline creatinine of 1.4-1.6. Urinalysis did not demonstrate an active sediment.  Patient was given 1L NS on 12/21 and then admitted. Patient found to have liver lesions on renal ultrasound -> MRI which confirmed numerous bilateral hepatic metastatic lesions largest centered in the medial segment LEFT hepatic lobe w/ bulky upper abdominal lymphadenopathy and periportal LAD. Patient is +1360 mL during this hospitalization  with  weight consistent at ~55.8kg. Patient also received FLEET x2 on 12/27 and 12/28. UOP has been decent in the 1L -1.9L range the past few days prior to consultation. She also had a hypotensive episode on 12/28 w/ pressures dropping from 132/72 to 98/59.   ROS Pertinent items are noted in HPI.  Chemistry and CBC: Creatinine, Ser  Date/Time Value Ref Range Status  05/26/2021 08:10 AM 3.52 (H) 0.44 - 1.00 mg/dL Final    Comment:    DELTA CHECK NOTED  05/24/2021 04:11 PM 2.45 (H) 0.44 - 1.00 mg/dL Final  05/23/2021 07:24 AM 2.12 (H) 0.44 - 1.00 mg/dL Final  05/22/2021 01:09 AM 2.15 (H) 0.44 - 1.00 mg/dL Final  05/21/2021 07:45 AM 2.28 (H) 0.44 - 1.00 mg/dL Final  05/20/2021 04:33 AM 1.97 (H) 0.44 - 1.00 mg/dL Final  05/19/2021 04:44 AM 1.60 (H) 0.44 - 1.00 mg/dL Final  05/18/2021 09:51 AM 1.75 (H) 0.44 - 1.00 mg/dL Final  05/17/2021 03:09 AM 1.65 (H) 0.44 - 1.00 mg/dL Final  05/16/2021 03:25 AM 1.93 (H) 0.44 - 1.00 mg/dL Final  05/15/2021 05:20 AM 2.14 (H) 0.44 - 1.00 mg/dL Final  05/14/2021 06:11 PM 2.68 (H) 0.44 - 1.00 mg/dL Final  12/06/2018 08:32 AM 1.44 (H) 0.44 - 1.00 mg/dL Final  12/03/2018 07:49 PM 1.55 (H) 0.44 - 1.00 mg/dL Final  12/03/2018 12:25 AM 1.52 (H) 0.44 - 1.00 mg/dL Final  11/28/2018 03:11 PM 1.48 (H) 0.44 - 1.00 mg/dL Final  10/17/2017 09:57 AM 1.61 (H) 0.44 - 1.00 mg/dL Final  10/16/2017 02:13 PM 1.67 (H) 0.44 - 1.00 mg/dL Final  10/16/2017 08:17 AM 1.85 (H) 0.44 - 1.00 mg/dL Final  10/16/2017  03:33 AM 1.86 (H) 0.44 - 1.00 mg/dL Final  10/15/2017 07:41 PM 2.00 (H) 0.44 - 1.00 mg/dL Final  10/15/2017 04:25 PM 1.86 (H) 0.44 - 1.00 mg/dL Final  10/15/2017 03:33 PM 2.00 (H) 0.44 - 1.00 mg/dL Final   Recent Labs  Lab 05/20/21 0433 05/21/21 0745 05/22/21 0109 05/23/21 0724 05/24/21 1611 05/26/21 0810  NA 151* 150* 143 141 140 137  K 4.5 4.0 4.0 3.7 4.3 4.4  CL 121* 115* 110 108 105 101  CO2 19* 23 22 21* 22 21*  GLUCOSE 117* 173* 119* 117* 152* 106*  BUN 14 14  14 10 17  42*  CREATININE 1.97* 2.28* 2.15* 2.12* 2.45* 3.52*  CALCIUM 9.4 10.1 9.5 9.4 9.7 9.1   Recent Labs  Lab 05/21/21 0745 05/22/21 0109 05/26/21 0810  WBC 11.4* 10.6* 15.8*  HGB 10.6* 10.4* 10.3*  HCT 35.2* 33.0* 31.4*  MCV 91.2 89.9 86.3  PLT 348 277 264   Liver Function Tests: Recent Labs  Lab 05/20/21 0433  AST 53*  ALT 42  ALKPHOS 162*  BILITOT 0.6  PROT 6.1*  ALBUMIN 2.9*   No results for input(s): LIPASE, AMYLASE in the last 168 hours. No results for input(s): AMMONIA in the last 168 hours. Cardiac Enzymes: No results for input(s): CKTOTAL, CKMB, CKMBINDEX, TROPONINI in the last 168 hours. Iron Studies: No results for input(s): IRON, TIBC, TRANSFERRIN, FERRITIN in the last 72 hours. PT/INR: @LABRCNTIP (inr:5)  Xrays/Other Studies: ) Results for orders placed or performed during the hospital encounter of 05/14/21 (from the past 48 hour(s))  Basic metabolic panel     Status: Abnormal   Collection Time: 05/24/21  4:11 PM  Result Value Ref Range   Sodium 140 135 - 145 mmol/L   Potassium 4.3 3.5 - 5.1 mmol/L   Chloride 105 98 - 111 mmol/L   CO2 22 22 - 32 mmol/L   Glucose, Bld 152 (H) 70 - 99 mg/dL    Comment: Glucose reference range applies only to samples taken after fasting for at least 8 hours.   BUN 17 8 - 23 mg/dL   Creatinine, Ser 2.45 (H) 0.44 - 1.00 mg/dL   Calcium 9.7 8.9 - 10.3 mg/dL   GFR, Estimated 22 (L) >60 mL/min    Comment: (NOTE) Calculated using the CKD-EPI Creatinine Equation (2021)    Anion gap 13 5 - 15    Comment: Performed at [facility] [address], [place], [place] [address]  CBC     Status: Abnormal   Collection Time: 05/26/21  8:10 AM  Result Value Ref Range   WBC 15.8 (H) 4.0 - 10.5 K/uL   RBC 3.64 (L) 3.87 - 5.11 MIL/uL   Hemoglobin 10.3 (L) 12.0 - 15.0 g/dL   HCT 31.4 (L) 36.0 - 46.0 %   MCV 86.3 80.0 - 100.0 fL   MCH 28.3 26.0 - 34.0 pg   MCHC 32.8 30.0 - 36.0 g/dL   RDW 16.6 (H) 11.5 - 15.5 %    Platelets 264 150 - 400 K/uL   nRBC 0.0 0.0 - 0.2 %    Comment: Performed at [facility] [address], [place], [place] [address]  Basic metabolic panel     Status: Abnormal   Collection Time: 05/26/21  8:10 AM  Result Value Ref Range   Sodium 137 135 - 145 mmol/L   Potassium 4.4 3.5 - 5.1 mmol/L   Chloride 101 98 - 111 mmol/L   CO2 21 (L) 22 -  32 mmol/L   Glucose, Bld 106 (H) 70 - 99 mg/dL    Comment: Glucose reference range applies only to samples taken after fasting for at least 8 hours.   BUN 42 (H) 8 - 23 mg/dL   Creatinine, Ser 3.52 (H) 0.44 - 1.00 mg/dL    Comment: DELTA CHECK NOTED   Calcium 9.1 8.9 - 10.3 mg/dL   GFR, Estimated 14 (L) >60 mL/min    Comment: (NOTE) Calculated using the CKD-EPI Creatinine Equation (2021)    Anion gap 15 5 - 15    Comment: Performed at [facility] [address] [place], [place] [address]   US RENAL  Result Date: 05/26/2021 CLINICAL DATA:  Acute kidney injury. EXAM: RENAL / URINARY TRACT ULTRASOUND COMPLETE COMPARISON:  CT, 05/19/2021.  MRI, 05/15/2021. FINDINGS: Right Kidney: Renal measurements: 10.2 x 4.4 x 4.6 cm = volume: 107.7 mL. Increased parenchymal echogenicity. No mass or focal lesion. No stone or hydronephrosis. Left Kidney: Renal measurements: 10.1 x 4.6 x 4.8 cm = volume: 117.7 mL. Increased parenchymal echogenicity. Questionable echogenic mass along the superior margin. This is likely not a mass since there was no abnormality in this location on the previous CT or MRI. Lower pole cyst measuring 2.2 x 2.2 x 1.7 cm. No other masses, no stones and no hydronephrosis. Bladder: Distended.  Some dependent debris.  No wall thickening or mass. Other: None. IMPRESSION: 1. No acute findings.  No hydronephrosis. 2. Bilateral echogenic kidneys consistent with medical renal disease. 3. 2.2 cm lower pole left renal cyst. Electronically Signed   By: Lajean Manes M.D.   On: 05/26/2021 11:29    PMH:   Past Medical History:  Diagnosis  Date   Bipolar disorder ([place])    Schizoaffective disorder ([facility])     PSH:  History reviewed. No pertinent surgical history.  Allergies:  Allergies  Allergen Reactions   Bee Venom Itching   Soap Rash    Medications:   Prior to Admission medications   Medication Sig Start Date End Date Taking? Authorizing Provider  ferrous sulfate 325 (65 FE) MG tablet Take 325 mg by mouth daily.   Yes [provider]  lamoTRIgine (LAMICTAL) 200 MG tablet Take 1 tablet (200 mg total) by mouth daily. 04/14/18  Yes Plovsky, Berneta Sages, MD  loratadine (CLARITIN) 10 MG tablet Take 10 mg by mouth daily.   Yes [provider]  lactulose (CHRONULAC) 10 GM/15ML solution Take 30 mLs (20 g total) by mouth daily. 05/24/21   Regalado, Jerald Kief A, MD  lithium carbonate 150 MG capsule Take 1 capsule (150 mg total) by mouth 2 (two) times daily with a meal. Patient not taking: Reported on 11/29/2018 03/09/18 03/09/19  Norma Fredrickson, MD  LORazepam (ATIVAN) 1 MG tablet Take 1 tablet (1 mg total) by mouth 2 (two) times daily. 05/23/21   Regalado, Belkys A, MD  OLANZapine (ZYPREXA) 20 MG tablet Take 0.5 tablets (10 mg total) by mouth 2 (two) times daily. Half tablet by mouth twice daily 05/23/21   Regalado, Belkys A, MD  tamsulosin (FLOMAX) 0.4 MG CAPS capsule Take 1 capsule (0.4 mg total) by mouth daily. 05/24/21   Regalado, Cassie Freer, MD    Discontinued Meds:   Medications Discontinued During This Encounter  Medication Reason   fluPHENAZine (PROLIXIN) 1 MG tablet    0.9 %  sodium chloride infusion    0.9 %  sodium chloride infusion    sodium phosphate (FLEET) 7-19 GM/118ML enema 1 enema  dextrose 5 % solution    OLANZapine (ZYPREXA) 20 MG tablet Reorder   LORazepam (ATIVAN) 1 MG tablet Reorder   dextrose 5 % solution     Social History:  reports that she has been smoking cigarettes. She has a 0.20 pack-year smoking history. She has never used smokeless tobacco. She reports that she does not drink  alcohol and does not use drugs.  Family History:  History reviewed. No pertinent family history.  Blood pressure 105/73, pulse (!) 124, temperature 99.5 F (37.5 C), temperature source Oral, resp. rate 18, height 5\' 5"  (1.651 m), weight 55.5 kg, SpO2 97 %. General appearance: slowed mentation Head: Normocephalic, without obvious abnormality, atraumatic Eyes: negative Neck: no adenopathy, no carotid bruit, no JVD, supple, symmetrical, trachea midline, and thyroid not enlarged, symmetric, no tenderness/mass/nodules Back: symmetric, no curvature. ROM normal. No CVA tenderness. Resp: clear to auscultation bilaterally Chest wall: no tenderness Cardio: regular rate and rhythm GI: soft, non-tender; bowel sounds normal; no masses,  no organomegaly Extremities: no edema, redness or tenderness in the calves or thighs Pulses: 2+ and symmetric       Juanjesus Pepperman, Hunt Oris, MD 05/26/2021, 12:26 PM

## 2021-05-27 ENCOUNTER — Inpatient Hospital Stay (HOSPITAL_COMMUNITY): Payer: BC Managed Care – PPO

## 2021-05-27 DIAGNOSIS — Z515 Encounter for palliative care: Secondary | ICD-10-CM

## 2021-05-27 DIAGNOSIS — N179 Acute kidney failure, unspecified: Secondary | ICD-10-CM | POA: Diagnosis not present

## 2021-05-27 DIAGNOSIS — F05 Delirium due to known physiological condition: Secondary | ICD-10-CM | POA: Diagnosis not present

## 2021-05-27 DIAGNOSIS — Z66 Do not resuscitate: Secondary | ICD-10-CM

## 2021-05-27 DIAGNOSIS — F319 Bipolar disorder, unspecified: Secondary | ICD-10-CM | POA: Diagnosis not present

## 2021-05-27 DIAGNOSIS — K769 Liver disease, unspecified: Secondary | ICD-10-CM | POA: Diagnosis not present

## 2021-05-27 LAB — CBC
HCT: 31.1 % — ABNORMAL LOW (ref 36.0–46.0)
Hemoglobin: 9.9 g/dL — ABNORMAL LOW (ref 12.0–15.0)
MCH: 27.7 pg (ref 26.0–34.0)
MCHC: 31.8 g/dL (ref 30.0–36.0)
MCV: 87.1 fL (ref 80.0–100.0)
Platelets: 256 10*3/uL (ref 150–400)
RBC: 3.57 MIL/uL — ABNORMAL LOW (ref 3.87–5.11)
RDW: 16.4 % — ABNORMAL HIGH (ref 11.5–15.5)
WBC: 15.5 10*3/uL — ABNORMAL HIGH (ref 4.0–10.5)
nRBC: 0 % (ref 0.0–0.2)

## 2021-05-27 LAB — BASIC METABOLIC PANEL
Anion gap: 15 (ref 5–15)
BUN: 46 mg/dL — ABNORMAL HIGH (ref 8–23)
CO2: 21 mmol/L — ABNORMAL LOW (ref 22–32)
Calcium: 8.9 mg/dL (ref 8.9–10.3)
Chloride: 98 mmol/L (ref 98–111)
Creatinine, Ser: 3.07 mg/dL — ABNORMAL HIGH (ref 0.44–1.00)
GFR, Estimated: 17 mL/min — ABNORMAL LOW (ref >60)
Glucose, Bld: 133 mg/dL — ABNORMAL HIGH (ref 70–99)
Potassium: 4.3 mmol/L (ref 3.5–5.1)
Sodium: 134 mmol/L — ABNORMAL LOW (ref 135–145)

## 2021-05-27 LAB — URINE CULTURE: Culture: <10000 — AB

## 2021-05-27 MED ORDER — LACTULOSE 10 GM/15ML PO SOLN: 20.0000 g | Freq: Three times a day (TID) | ORAL | Status: DC

## 2021-05-27 MED ORDER — LORAZEPAM 2 MG/ML IJ SOLN: 1.0000 mg | INTRAMUSCULAR | Status: DC | PRN

## 2021-05-27 MED ORDER — SENNA 8.6 MG PO TABS
1.0000 | ORAL_TABLET | Freq: Every day | ORAL | Status: DC
  Administered 2021-05-27 – 2021-06-06 (×11): 8.6 mg via ORAL
  Filled 2021-05-27 (×11): qty 1

## 2021-05-27 MED ORDER — MORPHINE SULFATE (PF) 2 MG/ML IV SOLN: 1.0000 mg | INTRAVENOUS | Status: DC | PRN

## 2021-05-27 NOTE — Plan of Care (Signed)
  Problem: Health Behavior/Discharge Planning: Goal: Ability to manage health-related needs will improve Outcome: Progressing   

## 2021-05-27 NOTE — Consult Note (Signed)
Consultation Note Date: 05/27/2021   Patient Name: Megan Miranda  DOB: Sep 10, 1960  MRN: [id number]  Age / Sex: 61 y.o., female  PCP: Patient, No Pcp Per (Inactive) Referring Physician: Elmarie Shiley, MD  Reason for Consultation: Establishing goals of care and Psychosocial/spiritual support  HPI/Patient Profile: 61 y.o. female  admitted on 05/14/2021 with  medical history significant for bipolar 1 disorder, schizoaffective disorder, CKD stage IIIb who presented to the ED for evaluation of generalized weakness/unstable gait.  Patient  currently living at an assisted living facility/ [facility].  Patient's brother is her legal guardian patient does not have capacity for medical decisions.  Patient found to have acute kidney injury/creatinine 2.68 on admission and incidentally found of multiple liver lesions on renal ultrasound, biopsy declined.  Patient admitted for treatment and stabilization.  Today is day 12 of this hospital stay   Patient has continues with weakness, poor oral intake; she is overall failing to thrive.  Family face treatment option decisions, advanced directive decisions and anticipatory care needs.   Clinical Assessment and Goals of Care:  This NP Wadie Lessen reviewed medical records, received report from team, assessed the patient and then meet at the patient's bedside along with her brother/legal guardian   to discuss diagnosis, prognosis, GOC, EOL wishes disposition and options.   Concept of Palliative Care was introduced as specialized medical care for people and their families living with serious illness.  If focuses on providing relief from the symptoms and stress of a serious illness.  The goal is to improve quality of life for both the patient and the family.  Values and goals of care important to patient and family were attempted to be elicited.  Created space and  opportunity for brother to explore thoughts and feelings regarding current medical situation.   He tells me his sister graduated from law school and was exceptional until unfortunately her mental health/schizophrenia took over, 25 yrs ago,  and has impacted her her life in a negative way ever since.   Her brother is her legal guardian and has secured care for her at [facility] for the past 2 years.  Ultimately he hopes for comfort and dignity for his sister.      A  discussion was had today regarding advanced directives.  Concepts specific to code status, artifical feeding and hydration, continued IV antibiotics and rehospitalization was had.    The difference between a aggressive medical intervention path  and a palliative comfort care path for this patient at this time was had.     Education offered on hospice; philosophy and eligibility.  MOST form completed to reflect comfort    Natural trajectory and expectations at EOL were discussed.  Questions and concerns addressed.  Family  encouraged to call with questions or concerns.     PMT will continue to support holistically.        LEGAL GUARDIAN/[name]/brother    SUMMARY OF RECOMMENDATIONS    Code Status/Advance Care Planning: DNR    Symptom  Management:  Agitation: Ativan 1 mg IV every 4 hours as needed Pain/dyspnea: Morphine 1 mg IV every 2 hours as needed  Palliative Prophylaxis:  Aspiration, Bowel Regimen, Delirium Protocol, Eye Care, Frequent Pain Assessment, and Oral Care  Additional Recommendations (Limitations, Scope, Preferences): Full Comfort Care Forego life prolonging measures and allow for natural death ultimate focus of care is comfort and dignity for Megan Miranda at this time.Marland Kitchen  Psycho-social/Spiritual:  Desire for further Chaplaincy support:yes Additional Recommendations: Education on Hospice  Prognosis:  PMT will evaluate patient in the next 24 to 48 hours for transition of care  options  Discharge Planning: To Be Determined      Primary Diagnoses: Present on Admission:  Acute kidney injury superimposed on chronic kidney disease ([place])  Bipolar I disorder ([place])  AKI (acute kidney injury) ([place])   I have reviewed the medical record, interviewed the patient and family, and examined the patient. The following aspects are pertinent.  Past Medical History:  Diagnosis Date   Bipolar disorder ([place])    Schizoaffective disorder ([place])    Social History   Socioeconomic History   Marital status: Married    Spouse name: Not on file   Number of children: Not on file   Years of education: Not on file   Highest education level: Not on file  Occupational History   Not on file  Tobacco Use   Smoking status: Some Days    Packs/day: 0.10    Years: 2.00    Pack years: 0.20    Types: Cigarettes   Smokeless tobacco: Never   Tobacco comments:    Smokes a few cigarettes a week.   Vaping Use   Vaping Use: Never used  Substance and Sexual Activity   Alcohol use: No   Drug use: No   Sexual activity: Never  Other Topics Concern   Not on file  Social History Narrative   Not on file   Social Determinants of Health   Financial Resource Strain: Not on file  Food Insecurity: Not on file  Transportation Needs: Not on file  Physical Activity: Not on file  Stress: Not on file  Social Connections: Not on file   History reviewed. No pertinent family history. Scheduled Meds:  heparin  5,000 Units Subcutaneous Q8H   lactulose  20 g Oral Daily   lamoTRIgine  200 mg Oral Daily   LORazepam  1 mg Oral BID   nicotine  14 mg Transdermal Daily   OLANZapine  10 mg Oral BID   tamsulosin  0.4 mg Oral Daily   Continuous Infusions:  cefTRIAXone (ROCEPHIN)  IV 1 g (05/26/21 2050)   dextrose 100 mL/hr at 05/27/21 0621   PRN Meds:.acetaminophen **OR** acetaminophen, ondansetron **OR** ondansetron (ZOFRAN) IV Medications Prior to Admission:  Prior to Admission medications    Medication Sig Start Date End Date Taking? Authorizing Provider  ferrous sulfate 325 (65 FE) MG tablet Take 325 mg by mouth daily.   Yes [provider]  lamoTRIgine (LAMICTAL) 200 MG tablet Take 1 tablet (200 mg total) by mouth daily. 04/14/18  Yes Plovsky, Berneta Sages, MD  loratadine (CLARITIN) 10 MG tablet Take 10 mg by mouth daily.   Yes [provider]  lactulose (CHRONULAC) 10 GM/15ML solution Take 30 mLs (20 g total) by mouth daily. 05/24/21   Regalado, Belkys A, MD  lithium carbonate 150 MG capsule Take 1 capsule (150 mg total) by mouth 2 (two) times daily with a meal. Patient not taking: Reported on  11/29/2018 03/09/18 03/09/19  Plovsky, Berneta Sages, MD  LORazepam (ATIVAN) 1 MG tablet Take 1 tablet (1 mg total) by mouth 2 (two) times daily. 05/23/21   Regalado, Belkys A, MD  OLANZapine (ZYPREXA) 20 MG tablet Take 0.5 tablets (10 mg total) by mouth 2 (two) times daily. Half tablet by mouth twice daily 05/23/21   Regalado, Belkys A, MD  tamsulosin (FLOMAX) 0.4 MG CAPS capsule Take 1 capsule (0.4 mg total) by mouth daily. 05/24/21   Regalado, Cassie Freer, MD   Allergies  Allergen Reactions   Bee Venom Itching   Soap Rash   Review of Systems  Unable to perform ROS: Psychiatric disorder   Physical Exam Cardiovascular:     Rate and Rhythm: Normal rate.  Pulmonary:     Effort: Pulmonary effort is normal.  Skin:    General: Skin is warm and dry.  Neurological:     Mental Status: She is alert.    Vital Signs: BP (!) 130/94 (BP Location: Left Arm)    Pulse (!) 118    Temp 98.2 F (36.8 C) (Oral)    Resp 20    Ht 5\' 5"  (1.651 m)    Wt 55.5 kg    SpO2 100%    BMI 20.36 kg/m  Pain Scale: 0-10   Pain Score: 0-No pain   SpO2: SpO2: 100 % O2 Device:SpO2: 100 % O2 Flow Rate: .   IO: Intake/output summary:  Intake/Output Summary (Last 24 hours) at 05/27/2021 0931 Last data filed at 05/27/2021 2563 Gross per 24 hour  Intake 2551.32 ml  Output 2600 ml  Net -48.68 ml    LBM:  Last BM Date: 05/24/21 Baseline Weight: Weight: 55.8 kg Most recent weight: Weight: 55.5 kg     Palliative Assessment/Data: 30 % at best   Discusssed with Dr Tyrell Antonio and bedside RN  High MDM  Signed by: Wadie Lessen, NP   Please contact Palliative Medicine Team phone at [phone] for questions and concerns.  For individual provider: See Shea Evans

## 2021-05-27 NOTE — Progress Notes (Addendum)
Physical Therapy Treatment Patient Details Name: Megan Miranda MRN: [id number] DOB: 1961-02-25 Today's Date: 05/27/2021   History of Present Illness Megan Miranda is a 61 y.o. female who presented 05/14/2021 to the ED for evaluation of generalized weakness/unstable gait and falls where legs gave out. Found to have acute kidney injury superimposed on chronic kidney disease, MRI brain negative for acute event. MRI revealed  multiple lesions in the liver concerning for metastatic process.Significant medical history for bipolar 1 disorder, schizoaffective disorder, CKD stage IIIb    PT Comments    Pt voicing paranoid and disorganized thought process. She thinks she is here because of the "world war and the Ice age." Difficult to redirect and does not follow commands well. Pt seems to have become quite debilitated due to decreased mobility. Requiring heavy two person assist for standing and pivoting over to the chair this date. Demonstrates right lateral lean in sitting. Continue to recommend SNF for ongoing Physical Therapy.      Recommendations for follow up therapy are one component of a multi-disciplinary discharge planning process, led by the attending physician.  Recommendations may be updated based on patient status, additional functional criteria and insurance authorization.  Follow Up Recommendations  Skilled nursing-short term rehab (<3 hours/day)     Assistance Recommended at Discharge Frequent or constant Supervision/Assistance  Patient can return home with the following     Equipment Recommendations  Other (comment) (defer)    Recommendations for Other Services       Precautions / Restrictions Precautions Precautions: Fall Precaution Comments: impulsive Restrictions Weight Bearing Restrictions: No     Mobility  Bed Mobility Overal bed mobility: Needs Assistance Bed Mobility: Supine to Sit     Supine to sit: Max assist     General bed mobility comments: Decreased  initiation by pt, mildly resistive, holding onto bed rail tightly    Transfers Overall transfer level: Needs assistance Equipment used: Rolling walker (2 wheels);None Transfers: Sit to/from Stand;Bed to chair/wheelchair/BSC Sit to Stand: Mod assist;+2 physical assistance Stand pivot transfers: Mod assist;+2 physical assistance         General transfer comment: Heavy modA + 2 to stand from edge of bed and pivot to recliner. Subsequently performed standing x 2 from recliner for doffing and donning of underwear    Ambulation/Gait                   Stairs             Wheelchair Mobility    Modified Rankin (Stroke Patients Only)       Balance Overall balance assessment: Needs assistance Sitting-balance support: Feet supported Sitting balance-Leahy Scale: Poor Sitting balance - Comments: heavy right lateral lean today, required max tactile cueing to correct. significant forward head posture Postural control: Right lateral lean Standing balance support: Bilateral upper extremity supported Standing balance-Leahy Scale: Poor                              Cognition Arousal/Alertness: Awake/alert Behavior During Therapy: Restless;Impulsive Overall Cognitive Status: No family/caregiver present to determine baseline cognitive functioning Area of Impairment: Orientation;Memory;Attention;Safety/judgement;Following commands;Awareness;Problem solving                 Orientation Level: Disoriented to;Time;Situation;Place Current Attention Level: Focused Memory: Decreased short-term memory;Decreased recall of precautions Following Commands: Follows one step commands inconsistently Safety/Judgement: Decreased awareness of deficits;Decreased awareness of safety Awareness: Intellectual Problem Solving: Slow processing;Decreased initiation;Difficulty sequencing;Requires  verbal cues;Requires tactile cues General Comments: Pt thinking she is here because of  "the world war and the Ice age." She continuously references having her lawyers come up to get her out and also asking for water. decreased command following        Exercises      General Comments        Pertinent Vitals/Pain Pain Assessment: No/denies pain Pain Score: 0-No pain Pain Intervention(s): Monitored during session    Home Living                          Prior Function            PT Goals (current goals can now be found in the care plan section) Acute Rehab PT Goals Patient Stated Goal: water PT Goal Formulation: With patient Time For Goal Achievement: 06/10/21 Potential to Achieve Goals: Fair Progress towards PT goals: Progressing toward goals    Frequency    Min 2X/week      PT Plan Current plan remains appropriate    Co-evaluation PT/OT/SLP Co-Evaluation/Treatment: Yes Reason for Co-Treatment: Complexity of the patient's impairments (multi-system involvement);Necessary to address cognition/behavior during functional activity;To address functional/ADL transfers;For patient/therapist safety PT goals addressed during session: Mobility/safety with mobility OT goals addressed during session: ADL's and self-care;Proper use of Adaptive equipment and DME      AM-PAC PT "6 Clicks" Mobility   Outcome Measure  Help needed turning from your back to your side while in a flat bed without using bedrails?: A Lot Help needed moving from lying on your back to sitting on the side of a flat bed without using bedrails?: A Lot Help needed moving to and from a bed to a chair (including a wheelchair)?: Total Help needed standing up from a chair using your arms (e.g., wheelchair or bedside chair)?: Total Help needed to walk in hospital room?: Total Help needed climbing 3-5 steps with a railing? : Total 6 Click Score: 8    End of Session Equipment Utilized During Treatment: Gait belt Activity Tolerance: Patient limited by fatigue Patient left: in chair;with  call bell/phone within reach;with chair alarm set Nurse Communication: Mobility status PT Visit Diagnosis: Other abnormalities of gait and mobility (R26.89);History of falling (Z91.81);Muscle weakness (generalized) (M62.81);Other symptoms and signs involving the nervous system (K16.010)     Time: 9323-5573 PT Time Calculation (min) (ACUTE ONLY): 33 min  Charges:  $Therapeutic Activity: 8-22 mins                     Wyona Almas, PT, DPT Acute Rehabilitation Services Pager [phone] Office [phone]    Deno Etienne 05/27/2021, 11:21 AM

## 2021-05-27 NOTE — Progress Notes (Signed)
Occupational Therapy Treatment Patient Details Name: Megan Miranda MRN: [id number] DOB: 1960-07-14 Today's Date: 05/27/2021   History of present illness Megan Miranda is a 61 y.o. female who presented 05/14/2021 to the ED for evaluation of generalized weakness/unstable gait and falls where legs gave out. Found to have acute kidney injury superimposed on chronic kidney disease, MRI brain negative for acute event. MRI revealed  multiple lesions in the liver concerning for metastatic process.Significant medical history for bipolar 1 disorder, schizoaffective disorder, CKD stage IIIb   OT comments  Pt sitting EOB with PT assisting he upon arrival. Pt with leaning heavily to R side and seemed to be paranoid with disorganized thought process, stating that she is here because of the "world war and the Ice age"; very difficult to redirect and does not follow commands well. Pt required heavy two person assist for SPTs and total A for LB dressing/clothing mgt. OT will continue to follow acutely to maximize level of function and safety   Recommendations for follow up therapy are one component of a multi-disciplinary discharge planning process, led by the attending physician.  Recommendations may be updated based on patient status, additional functional criteria and insurance authorization.    Follow Up Recommendations  Skilled nursing-short term rehab (<3 hours/day)    Assistance Recommended at Discharge Frequent or constant Supervision/Assistance  Patient can return home with the following      Equipment Recommendations  Other (comment) (TBd at SNF)    Recommendations for Other Services      Precautions / Restrictions Precautions Precautions: Fall Precaution Comments: impulsive Restrictions Weight Bearing Restrictions: No       Mobility Bed Mobility Overal bed mobility: Needs Assistance Bed Mobility: Supine to Sit     Supine to sit: Max assist     General bed mobility comments: upon  arrival, pt sitting EOB with PT assisting her    Transfers Overall transfer level: Needs assistance Equipment used: Rolling walker (2 wheels);None Transfers: Sit to/from Stand;Bed to chair/wheelchair/BSC Sit to Stand: Mod assist;+2 physical assistance Stand pivot transfers: Mod assist;+2 physical assistance         General transfer comment: Heavy modA + 2 to stand from edge of bed and pivot to recliner. Subsequently performed standing x 2 from recliner for doffing and donning of underwear     Balance Overall balance assessment: Needs assistance Sitting-balance support: Feet supported Sitting balance-Leahy Scale: Poor Sitting balance - Comments: heavy right lateral lean today, required max tactile cueing to correct. significant forward head posture Postural control: Right lateral lean Standing balance support: Bilateral upper extremity supported Standing balance-Leahy Scale: Poor                             ADL either performed or assessed with clinical judgement   ADL Overall ADL's : Needs assistance/impaired     Grooming: Wash/dry face;Wash/dry hands;Set up;Cueing for sequencing;Sitting           Upper Body Dressing : Moderate assistance;Sitting Upper Body Dressing Details (indicate cue type and reason): right and posterior lean Lower Body Dressing: Total assistance Lower Body Dressing Details (indicate cue type and reason): doffing and donning clean underwear Toilet Transfer: Moderate assistance;+2 for physical assistance;+2 for safety/equipment;Stand-pivot;Cueing for safety;Cueing for sequencing Toilet Transfer Details (indicate cue type and reason): simulated bed to recliner Toileting- Clothing Manipulation and Hygiene: Maximal assistance;Sit to/from stand Toileting - Clothing Manipulation Details (indicate cue type and reason): Mod A +2 sit<>stand  Functional mobility during ADLs: Moderate assistance;Rolling walker (2 wheels);Cueing for safety;Cueing  for sequencing;+2 for physical assistance      Extremity/Trunk Assessment Upper Extremity Assessment Upper Extremity Assessment: Generalized weakness   Lower Extremity Assessment Lower Extremity Assessment: Defer to PT evaluation        Vision Baseline Vision/History: 1 Wears glasses Patient Visual Report: No change from baseline     Perception     Praxis      Cognition Arousal/Alertness: Awake/alert Behavior During Therapy: Restless;Impulsive Overall Cognitive Status: No family/caregiver present to determine baseline cognitive functioning Area of Impairment: Orientation;Memory;Attention;Safety/judgement;Following commands;Awareness;Problem solving                 Orientation Level: Disoriented to;Time;Situation;Place Current Attention Level: Focused Memory: Decreased short-term memory;Decreased recall of precautions Following Commands: Follows one step commands inconsistently Safety/Judgement: Decreased awareness of deficits;Decreased awareness of safety Awareness: Intellectual Problem Solving: Slow processing;Decreased initiation;Difficulty sequencing;Requires verbal cues;Requires tactile cues General Comments: Pt thinking she is here because of "the world war and the Ice age." She continuously references having her lawyers come up to get her out and also asking for water. decreased command following          Exercises     Shoulder Instructions       General Comments      Pertinent Vitals/ Pain       Pain Assessment: No/denies pain Pain Score: 0-No pain Faces Pain Scale: No hurt Pain Intervention(s): Monitored during session;Repositioned  Home Living                                          Prior Functioning/Environment              Frequency  Min 2X/week        Progress Toward Goals  OT Goals(current goals can now be found in the care plan section)  Progress towards OT goals: OT to reassess next treatment     Plan  Discharge plan remains appropriate    Co-evaluation    PT/OT/SLP Co-Evaluation/Treatment: Yes Reason for Co-Treatment: Necessary to address cognition/behavior during functional activity;For patient/therapist safety;To address functional/ADL transfers;Complexity of the patient's impairments (multi-system involvement) PT goals addressed during session: Mobility/safety with mobility OT goals addressed during session: ADL's and self-care;Proper use of Adaptive equipment and DME      AM-PAC OT "6 Clicks" Daily Activity     Outcome Measure   Help from another person eating meals?: None Help from another person taking care of personal grooming?: A Little Help from another person toileting, which includes using toliet, bedpan, or urinal?: A Lot Help from another person bathing (including washing, rinsing, drying)?: A Lot Help from another person to put on and taking off regular upper body clothing?: A Lot Help from another person to put on and taking off regular lower body clothing?: Total 6 Click Score: 14    End of Session Equipment Utilized During Treatment: Gait belt;Rolling walker (2 wheels)  OT Visit Diagnosis: Unsteadiness on feet (R26.81);Other abnormalities of gait and mobility (R26.89);Muscle weakness (generalized) (M62.81);Pain;Other symptoms and signs involving cognitive function   Activity Tolerance Patient tolerated treatment well   Patient Left with call bell/phone within reach;in chair;with chair alarm set   Nurse Communication          Time: [phone] OT Time Calculation (min): 25 min  Charges: OT General Charges $OT Visit: 1 Visit OT Treatments $  Self Care/Home Management : 8-22 mins    Emmit Alexanders [facility] 05/27/2021, 1:49 PM

## 2021-05-27 NOTE — Progress Notes (Addendum)
PROGRESS NOTE    Megan Miranda  ZOX:[id number] DOB: [birth year] DOA: 05/14/2021 PCP: Patient, No Pcp Per (Inactive)   Brief Narrative: 61 year old with past medical history significant for bipolar 1 disorder, schizoaffective disorder, CKD stage IIIb, who presents to the ED for evaluation of generalized weakness/unstable gait.  Over a period of few days prior to admission she has been feeling weaker than usual.  Patient was found to have acute kidney injury.  MRI of the brain did not show any acute findings.  Patient was hospitalized for further management.  Incidentally found to have multiples liver lesion on renal ultrasound.  This prompted MRI of the liver which raise concern for metastatic disease. We  subsequently found that patient was deemed incompetent in 2020 and her brother is her guardian.  Liver biopsy was not  pursued with patient is declining, and brother does not want to proceed with biopsy for now, plan was to stabilize patient and follow up out patient.   Patient develops worsening renal function. Repeated renal US negative for hydronephrosis. Worsening renal function presume related to poor oral intake, Vs ATN. Renal function improved with fluids.   Palliative care consulted for goals of care.     Assessment & Plan:   Principal Problem:   Delirium due to another medical condition Active Problems:   Bipolar I disorder ([place])   Acute kidney injury superimposed on chronic kidney disease ([place])   Generalized weakness   AKI (acute kidney injury) ([place])   1-Acute kidney injury superimposed on chronic kidney disease a stage IIIb; -Creatinine baseline  appears to be 1.4--1.6 -Felt to be dehydrated on admission, creatinine peaked to 2.6 -She was off IV fluids for 24 hours.  Cr worse on 1/02:  at 3.5. Repeated renal US 1/02; negative for hydronephrosis. Resume IV fluids.  -IV fluids resume.  -Nephrologist has been consulted.  Per nephrologist worsening renal function could be in  the setting of poor oral intake, versus ATN from Fleet induced phosphate tubulopathy.  -I have consulted palliative care for goals of care.   2-Hypernatremia: Sodium continues to be elevated Increase free water, discussed with patient and nurse staff Resolved with D 5 IV fluids.    3-Liver lesion concerning for metastatic process, abnormal liver function test: MRI revealed  multiple lesions in the liver concerning for metastatic process. -CEA elevated at 21.  CA 19-9 alpha-fetoprotein normal -CT abdomen and pelvis did not show primary. Uterine fibroid.  -Dr. Pricilla Handler discussed with patient's brother, apparently patient has been adamantly refusing biopsy.  Plan is to hold on liver biopsy for now.  If Patient  still continue to refuse them plan is to stabilize her and send her to a SNF and involve palliative care in the community. -Brother is legal guardian.  -referred out patient  hematology oncology clinic.  - Palliative care for goals of care.  Updated: palliative met with brother, decision was to proceed with full comfort care. Palliative will re-evaluate patient in 24--48 hours to determine if patient qualifies for residential hospice.   4-fecal impaction with leakage: CT scan show evidence of fecal impaction in the rectal area Significant bowel movement with Fleet enema yesterday. Plan to repeat Fleet enema and might need manual disimpaction.  Discussed with nurse Had BM.  Continue with lactulose.  Repeated KUB with large amount of stool colon. Will increase lactulose does.   5-Generalized weakness/unsteady gait: -MRI of the brain did not show any acute finding. CK mildly elevated, B12 1110.  Folic acid normal  TSH normal. Weakness improving. She will need skilled nursing facility for short-term  6-History of bipolar disorder/schizoaffective disorder: Continue with Lamictal, Zyprexa and Ativan Was evaluated by psychiatric, they have signed off  Normocytic anemia: Anemia panel  unremarkable for any deficiency.  Monitor hemoglobin  Urine retention: Patient required in and out catheterization twice during this hospitalization.  She was a started on Flomax.  She has been able to void on her own.   Left  renal cyst: Noted on renal ultrasound  Leukocytosis; urine culture insignificant growth.      Estimated body mass index is 20.36 kg/m as calculated from the following:   Height as of this encounter: '5\' 5"'  (1.651 m).   Weight as of this encounter: 55.5 kg.   DVT prophylaxis: Heparin  Code Status: DNR Family Communication: care discussed with patient and brother  1/02 Disposition Plan:  Status is: Inpatient  Remains inpatient appropriate because: Awaiting SNF      Consultants:  IR  Procedures:    Antimicrobials:    Subjective: She remain confuse, denies pain.   Objective: Vitals:   05/26/21 2030 05/27/21 0029 05/27/21 0415 05/27/21 0900  BP: 108/73 114/72 (!) 130/94 128/88  Pulse: (!) 119 (!) 124 (!) 118 100  Resp: '18 20 20 18  ' Temp: 98.7 F (37.1 C) 99 F (37.2 C) 98.2 F (36.8 C) 98.1 F (36.7 C)  TempSrc: Oral  Oral Oral  SpO2: 95% 97% 100% 100%  Weight:      Height:        Intake/Output Summary (Last 24 hours) at 05/27/2021 1453 Last data filed at 05/27/2021 1300 Gross per 24 hour  Intake 2371.32 ml  Output 1975 ml  Net 396.32 ml    Filed Weights   05/16/21 0534 05/17/21 0437 05/23/21 2042  Weight: 53 kg 55.4 kg 55.5 kg    Examination:  General exam: NAD, chronic ill appearing.  Respiratory system: CTA Cardiovascular system: S 1, S 2 RRR Gastrointestinal system: BS present, soft, nt Central nervous system; Alert Extremities: no edema   Data Reviewed: I have personally reviewed following labs and imaging studies  CBC: Recent Labs  Lab 05/21/21 0745 05/22/21 0109 05/26/21 0810 05/27/21 0220  WBC 11.4* 10.6* 15.8* 15.5*  HGB 10.6* 10.4* 10.3* 9.9*  HCT 35.2* 33.0* 31.4* 31.1*  MCV 91.2 89.9 86.3 87.1  PLT  348 277 264 169    Basic Metabolic Panel: Recent Labs  Lab 05/22/21 0109 05/23/21 0724 05/24/21 1611 05/26/21 0810 05/26/21 1402 05/27/21 0220  NA 143 141 140 137  --  134*  K 4.0 3.7 4.3 4.4  --  4.3  CL 110 108 105 101  --  98  CO2 22 21* 22 21*  --  21*  GLUCOSE 119* 117* 152* 106*  --  133*  BUN '14 10 17 ' 42*  --  46*  CREATININE 2.15* 2.12* 2.45* 3.52*  --  3.07*  CALCIUM 9.5 9.4 9.7 9.1 8.7* 8.9  PHOS  --   --   --  5.4*  --   --     GFR: Estimated Creatinine Clearance: 16.9 mL/min (A) (by C-G formula based on SCr of 3.07 mg/dL (H)). Liver Function Tests: No results for input(s): AST, ALT, ALKPHOS, BILITOT, PROT, ALBUMIN in the last 168 hours.  No results for input(s): LIPASE, AMYLASE in the last 168 hours. No results for input(s): AMMONIA in the last 168 hours.  Coagulation Profile: No results for input(s): INR, PROTIME in the last 168 hours.  Cardiac Enzymes: Recent Labs  Lab 05/26/21 0810  CKTOTAL 231    BNP (last 3 results) No results for input(s): PROBNP in the last 8760 hours. HbA1C: No results for input(s): HGBA1C in the last 72 hours. CBG: No results for input(s): GLUCAP in the last 168 hours. Lipid Profile: No results for input(s): CHOL, HDL, LDLCALC, TRIG, CHOLHDL, LDLDIRECT in the last 72 hours. Thyroid Function Tests: No results for input(s): TSH, T4TOTAL, FREET4, T3FREE, THYROIDAB in the last 72 hours. Anemia Panel: No results for input(s): VITAMINB12, FOLATE, FERRITIN, TIBC, IRON, RETICCTPCT in the last 72 hours. Sepsis Labs: No results for input(s): PROCALCITON, LATICACIDVEN in the last 168 hours.  Recent Results (from the past 240 hour(s))  Urine Culture     Status: Abnormal   Collection Time: 05/26/21 11:51 AM   Specimen: Urine, Clean Catch  Result Value Ref Range Status   Specimen Description URINE, CLEAN CATCH  Final   Special Requests NONE  Final   Culture (A)  Final    <10,000 COLONIES/mL INSIGNIFICANT GROWTH Performed at  [facility], [address] [place], [place] [address]    Report Status 05/27/2021 FINAL  Final           Radiology Studies: DG Abd 1 View  Result Date: 05/27/2021 CLINICAL DATA:  Abdominal pain, constipation EXAM: ABDOMEN - 1 VIEW COMPARISON:  None. FINDINGS: Bowel gas pattern is nonspecific. Large amount of stool is seen in colon. Lower portion of pelvis is not included in the radiograph limiting evaluation of rectum. No abnormal masses or calcifications are seen. Kidneys are partly obscured by bowel contents. IMPRESSION: Large amount of stool is seen in the colon. Bowel gas pattern is otherwise unremarkable. Electronically Signed   By: Elmer Picker M.D.   On: 05/27/2021 10:28   US RENAL  Result Date: 05/26/2021 CLINICAL DATA:  Acute kidney injury. EXAM: RENAL / URINARY TRACT ULTRASOUND COMPLETE COMPARISON:  CT, 05/19/2021.  MRI, 05/15/2021. FINDINGS: Right Kidney: Renal measurements: 10.2 x 4.4 x 4.6 cm = volume: 107.7 mL. Increased parenchymal echogenicity. No mass or focal lesion. No stone or hydronephrosis. Left Kidney: Renal measurements: 10.1 x 4.6 x 4.8 cm = volume: 117.7 mL. Increased parenchymal echogenicity. Questionable echogenic mass along the superior margin. This is likely not a mass since there was no abnormality in this location on the previous CT or MRI. Lower pole cyst measuring 2.2 x 2.2 x 1.7 cm. No other masses, no stones and no hydronephrosis. Bladder: Distended.  Some dependent debris.  No wall thickening or mass. Other: None. IMPRESSION: 1. No acute findings.  No hydronephrosis. 2. Bilateral echogenic kidneys consistent with medical renal disease. 3. 2.2 cm lower pole left renal cyst. Electronically Signed   By: Lajean Manes M.D.   On: 05/26/2021 11:29        Scheduled Meds:  heparin  5,000 Units Subcutaneous Q8H   lactulose  20 g Oral TID   lamoTRIgine  200 mg Oral Daily   LORazepam  1 mg Oral BID   nicotine  14 mg Transdermal Daily   OLANZapine   10 mg Oral BID   senna  1 tablet Oral Daily   tamsulosin  0.4 mg Oral Daily   Continuous Infusions:  cefTRIAXone (ROCEPHIN)  IV 1 g (05/26/21 2050)   dextrose 100 mL/hr at 05/27/21 0621      LOS: 12 days    Time spent: 35 minutes    Mccormick Macon A Drusilla Wampole, MD [facility]   If 7PM-7AM,  please contact night-coverage [email or web address]  05/27/2021, 2:53 PM

## 2021-05-27 NOTE — Progress Notes (Signed)
Megan Miranda Progress Note   61 y.o. female PMH bipolar disorder, HTN, CKD 3b presenting with an unstable gait and weakness from an assisted living facility.  She was found in the ED to have a BUN/Cr of 15/2.68 with a baseline creatinine of 1.4-1.6. Urinalysis did not demonstrate an active sediment.  Patient was given 1L NS on 12/21 and then admitted. Patient found to have liver lesions on renal ultrasound -> MRI which confirmed numerous bilateral hepatic metastatic lesions largest centered in the medial segment LEFT hepatic lobe w/ bulky upper abdominal lymphadenopathy and periportal LAD.  Pt also received FLEET x2 on 12/27 and 12/28. UOP has been decent in the 1L -1.9L range the past few days prior to consultation. She also had a hypotensive episode as well  Assessment/ Plan:   Acute kidney injury superimposed on CKD stage IIIb: Creatinine 2.68 on admission, previous baseline appears to be 1.4-1.6.  She was prerenal on admission. - Would continue IV fluid hydration if she's not  consuming much PO. She does have an appetite. -Renal ultrasound did not show hydronephrosis but the bladder was distended. -May also be secondary to ATN from [place] induced phosphate tubulopathy superimposed on prerenal + baseline CKD + advanced age. - Li level <0.06 and she has not been on Li during this hospitalization. - Fortunately no absolute indication for RRT; with all the hepatic lesions and LAD very reasonable to get palliative involved.  She has an appetite and fortunately renal function is slowly improving. Hopefully that will represent a trend.  -Monitor Daily I/Os, Daily weight  -Maintain MAP>65 for optimal renal perfusion.  -Avoid nephrotoxic medications including NSAIDs    Generalized weakness/abnormal gait/fall at facility: Patient reports generalized weakness with falls at her facility.  No focal neurological deficit on admission.  Requiring 2 person assist to ambulate in the ED.  MRI  brain negative for acute abnormality.    Bipolar 1/schizoaffective disorder: Per primary on  home Lamictal 200 mg daily, Zyprexa 10 mg twice daily, Ativan 1 mg twice daily.  Subjective:   Feels better. Denies f/c/n/v/ dyspnea.   Objective:   BP 128/88 (BP Location: Left Arm)    Pulse 100    Temp 98.1 F (36.7 C) (Oral)    Resp 18    Ht 5\' 5"  (1.651 m)    Wt 55.5 kg    SpO2 100%    BMI 20.36 kg/m   Intake/Output Summary (Last 24 hours) at 05/27/2021 1017 Last data filed at 05/27/2021 0900 Gross per 24 hour  Intake 2431.32 ml  Output 2775 ml  Net -343.68 ml   Weight change:   Physical Exam: GEN: NAD, much more alert today NCAT HEENT: No conjunctival pallor, EOMI NECK: Supple, no thyromegaly LUNGS: CTA B/L no rales, rhonchi or wheezing CV: RRR, No M/R/G ABD: SNDNT +BS  EXT: No lower extremity edema, tremors present   Imaging: US RENAL  Result Date: 05/26/2021 CLINICAL DATA:  Acute kidney injury. EXAM: RENAL / URINARY TRACT ULTRASOUND COMPLETE COMPARISON:  CT, 05/19/2021.  MRI, 05/15/2021. FINDINGS: Right Kidney: Renal measurements: 10.2 x 4.4 x 4.6 cm = volume: 107.7 mL. Increased parenchymal echogenicity. No mass or focal lesion. No stone or hydronephrosis. Left Kidney: Renal measurements: 10.1 x 4.6 x 4.8 cm = volume: 117.7 mL. Increased parenchymal echogenicity. Questionable echogenic mass along the superior margin. This is likely not a mass since there was no abnormality in this location on the previous CT or MRI. Lower pole cyst measuring 2.2 x 2.2  x 1.7 cm. No other masses, no stones and no hydronephrosis. Bladder: Distended.  Some dependent debris.  No wall thickening or mass. Other: None. IMPRESSION: 1. No acute findings.  No hydronephrosis. 2. Bilateral echogenic kidneys consistent with medical renal disease. 3. 2.2 cm lower pole left renal cyst. Electronically Signed   By: Lajean Manes M.D.   On: 05/26/2021 11:29    Labs: BMET Recent Labs  Lab 05/21/21 0745 05/22/21 0109  05/23/21 0724 05/24/21 1611 05/26/21 0810 05/26/21 1402 05/27/21 0220  NA 150* 143 141 140 137  --  134*  K 4.0 4.0 3.7 4.3 4.4  --  4.3  CL 115* 110 108 105 101  --  98  CO2 23 22 21* 22 21*  --  21*  GLUCOSE 173* 119* 117* 152* 106*  --  133*  BUN 14 14 10 17  42*  --  46*  CREATININE 2.28* 2.15* 2.12* 2.45* 3.52*  --  3.07*  CALCIUM 10.1 9.5 9.4 9.7 9.1 8.7* 8.9  PHOS  --   --   --   --  5.4*  --   --    CBC Recent Labs  Lab 05/21/21 0745 05/22/21 0109 05/26/21 0810 05/27/21 0220  WBC 11.4* 10.6* 15.8* 15.5*  HGB 10.6* 10.4* 10.3* 9.9*  HCT 35.2* 33.0* 31.4* 31.1*  MCV 91.2 89.9 86.3 87.1  PLT 348 277 264 256    Medications:     heparin  5,000 Units Subcutaneous Q8H   lactulose  20 g Oral Daily   lamoTRIgine  200 mg Oral Daily   LORazepam  1 mg Oral BID   nicotine  14 mg Transdermal Daily   OLANZapine  10 mg Oral BID   tamsulosin  0.4 mg Oral Daily      Otelia Santee, MD 05/27/2021, 10:17 AM

## 2021-05-28 DIAGNOSIS — N179 Acute kidney failure, unspecified: Secondary | ICD-10-CM | POA: Diagnosis not present

## 2021-05-28 DIAGNOSIS — N189 Chronic kidney disease, unspecified: Secondary | ICD-10-CM | POA: Diagnosis not present

## 2021-05-28 DIAGNOSIS — F05 Delirium due to known physiological condition: Secondary | ICD-10-CM | POA: Diagnosis not present

## 2021-05-28 DIAGNOSIS — K769 Liver disease, unspecified: Secondary | ICD-10-CM | POA: Diagnosis not present

## 2021-05-28 DIAGNOSIS — R531 Weakness: Secondary | ICD-10-CM | POA: Diagnosis not present

## 2021-05-28 DIAGNOSIS — F319 Bipolar disorder, unspecified: Secondary | ICD-10-CM | POA: Diagnosis not present

## 2021-05-28 DIAGNOSIS — R627 Adult failure to thrive: Secondary | ICD-10-CM

## 2021-05-28 MED ORDER — LORAZEPAM 1 MG PO TABS: 1.0000 mg | ORAL_TABLET | ORAL | Status: DC | PRN

## 2021-05-28 MED ORDER — POLYETHYLENE GLYCOL 3350 17 G PO PACK
17.0000 g | PACK | Freq: Every day | ORAL | Status: DC | PRN
  Administered 2021-05-28 – 2021-06-05 (×4): 17 g via ORAL
  Filled 2021-05-28 (×6): qty 1

## 2021-05-28 MED ORDER — MORPHINE SULFATE 10 MG/5ML PO SOLN: 5.0000 mg | ORAL | Status: DC | PRN

## 2021-05-28 MED ORDER — SODIUM CHLORIDE 0.45 % IV SOLN: INTRAVENOUS | Status: DC

## 2021-05-28 NOTE — Progress Notes (Signed)
Patient is A&O x 1. Patient continues to refuse lunch. Continue to encourage snacks and fluids.

## 2021-05-28 NOTE — Progress Notes (Signed)
Patient ID: Megan Miranda, female   DOB: 1960-06-24, 61 y.o.   MRN: [id number]    Progress Note from the Palliative Medicine Team at [facility]   Patient Name: Megan Miranda        Date: 05/28/2021 DOB: 09-04-1960  Age: 61 y.o. MRN#: [id number] Attending Physician: Dwyane Dee, MD Primary Care Physician: Patient, No Pcp Per (Inactive) Admit Date: 05/14/2021   Medical records reviewed   61 y.o. female  admitted on 05/14/2021 with  medical history significant for bipolar 1 disorder, schizoaffective disorder, CKD stage IIIb who presented to the ED for evaluation of generalized weakness/unstable gait.  Patient  currently living at an assisted living facility/ [facility].  Patient's brother is her legal guardian,  patient does not have capacity for medical decisions.   Patient found to have acute kidney injury/creatinine 2.68 on admission and incidentally found of multiple liver lesions on renal ultrasound, biopsy declined.   Patient admitted for treatment and stabilization.  This NP visited patient at the bedside as a follow up to  yesterday's [place] and to assess for palliative medicine needs and emotional support.  Patient has continues with weakness, poor oral intake; she is overall failing to thrive.  Yesterday decision was made by family to shift to full comfort focusing on comfort and dignity.  Spoke to brother/legal [name] ,  and also with Geriatric Advocate/Jennifer  Harriss # [phone].  [name] has been working with this patient for several years.    According to advocate patient has had continued physical and functional decline over the last few years.  She has had poor p.o. intake and weight loss.      Patient has always refused medical treatment in the past , and continues to not be interested in medical interventions or life prolonging measures of any kind.   Detailed education offered today to both patient's brother/[name] and advocate  [name] detailing the difference between an aggressive medical intervention path and a palliative comfort path for this patient at this time in this situation.  Education offered on the natural trajectory and expectations at EOL.  Education offered specifically on natural dehydration in the dying process.   As reflected on  completed MOST form there is no desire for life prolonging measures,  focus is to allow for a natural death. -DNR/DNI -No artificial feeding now or in the future -No antibiotic use unless for comfort -No IV fluids -No further diagnostics or life prolonging measures -Continue to evaluate for appropriate transition of care.  Patient will be unable to return to her assisted living, education offered on the difference between skilled nursing facility for custodial care versus residential hospice for end-of-life care.  Questions and concerns addressed   Discussed with Dr Sabino Gasser and Lind Covert LCSW  PMT will continue to support holistically  Wadie Lessen NP  Palliative Medicine Team Team Phone # [phone] Pager [phone]

## 2021-05-28 NOTE — Progress Notes (Signed)
[facility] Progress Note   61 y.o. female PMH bipolar disorder, HTN, CKD 3b presenting with an unstable gait and weakness from an assisted living facility.  She was found in the ED to have a BUN/Cr of 15/2.68 with a baseline creatinine of 1.4-1.6. Urinalysis did not demonstrate an active sediment.  Patient was given 1L NS on 12/21 and then admitted. Patient found to have liver lesions on renal ultrasound -> MRI which confirmed numerous bilateral hepatic metastatic lesions largest centered in the medial segment LEFT hepatic lobe w/ bulky upper abdominal lymphadenopathy and periportal LAD.  Pt also received FLEET x2 on 12/27 and 12/28. UOP has been decent in the 1L -1.9L range the past few days prior to consultation. She also had a hypotensive episode as well  Assessment/ Plan:   Acute kidney injury superimposed on CKD stage IIIb: Creatinine 2.68 on admission, previous baseline appears to be 1.4-1.6.  She was prerenal on admission. Renal ultrasound did not show hydronephrosis but the bladder was distended. Kidney injury likely from early ATN + prerenal azotemia. Li level <0.06 and she has not been on Li during this hospitalization. - She does have an appetite. UOP is very good. Can give trial off IVF which is currently off. - Fortunately no absolute indication for RRT; with all the hepatic lesions and LAD very reasonable to get palliative involved.  She has an appetite and fortunately renal function was slowly improving yest. Hopefully that will represent a trend.  -Monitor Daily I/Os, Daily weight  -Maintain MAP>65 for optimal renal perfusion.  -Avoid nephrotoxic medications including NSAIDs    Generalized weakness/abnormal gait/fall at facility: Patient reports generalized weakness with falls at her facility.  No focal neurological deficit on admission.  Requiring 2 person assist to ambulate in the ED.  MRI brain negative for acute abnormality.    Bipolar 1/schizoaffective  disorder: Per primary on  home Lamictal 200 mg daily, Zyprexa 10 mg twice daily, Ativan 1 mg twice daily.  Subjective:   Feels better. Denies f/c/n/v/ dyspnea. She ate her french toast and some bacon this am and is thirsty.   Objective:   BP 101/70 (BP Location: Left Arm)    Pulse (!) 126    Temp 98 F (36.7 C) (Oral)    Resp 19    Ht 5\' 5"  (1.651 m)    Wt 55.5 kg    SpO2 95%    BMI 20.36 kg/m   Intake/Output Summary (Last 24 hours) at 05/28/2021 0355 Last data filed at 05/28/2021 0700 Gross per 24 hour  Intake 860.75 ml  Output 1350 ml  Net -489.25 ml   Weight change:   Physical Exam: GEN: NAD, much more alert today NCAT HEENT: No conjunctival pallor, EOMI NECK: Supple, no thyromegaly LUNGS: CTA B/L no rales, rhonchi or wheezing CV: RRR, No M/R/G ABD: SNDNT +BS  EXT: No lower extremity edema, tremors present   Imaging: DG Abd 1 View  Result Date: 05/27/2021 CLINICAL DATA:  Abdominal pain, constipation EXAM: ABDOMEN - 1 VIEW COMPARISON:  None. FINDINGS: Bowel gas pattern is nonspecific. Large amount of stool is seen in colon. Lower portion of pelvis is not included in the radiograph limiting evaluation of rectum. No abnormal masses or calcifications are seen. Kidneys are partly obscured by bowel contents. IMPRESSION: Large amount of stool is seen in the colon. Bowel gas pattern is otherwise unremarkable. Electronically Signed   By: Elmer Picker M.D.   On: 05/27/2021 10:28   US RENAL  Result  Date: 05/26/2021 CLINICAL DATA:  Acute kidney injury. EXAM: RENAL / URINARY TRACT ULTRASOUND COMPLETE COMPARISON:  CT, 05/19/2021.  MRI, 05/15/2021. FINDINGS: Right Kidney: Renal measurements: 10.2 x 4.4 x 4.6 cm = volume: 107.7 mL. Increased parenchymal echogenicity. No mass or focal lesion. No stone or hydronephrosis. Left Kidney: Renal measurements: 10.1 x 4.6 x 4.8 cm = volume: 117.7 mL. Increased parenchymal echogenicity. Questionable echogenic mass along the superior margin. This is  likely not a mass since there was no abnormality in this location on the previous CT or MRI. Lower pole cyst measuring 2.2 x 2.2 x 1.7 cm. No other masses, no stones and no hydronephrosis. Bladder: Distended.  Some dependent debris.  No wall thickening or mass. Other: None. IMPRESSION: 1. No acute findings.  No hydronephrosis. 2. Bilateral echogenic kidneys consistent with medical renal disease. 3. 2.2 cm lower pole left renal cyst. Electronically Signed   By: Lajean Manes M.D.   On: 05/26/2021 11:29    Labs: BMET Recent Labs  Lab 05/21/21 0745 05/22/21 0109 05/23/21 0724 05/24/21 1611 05/26/21 0810 05/26/21 1402 05/27/21 0220  NA 150* 143 141 140 137  --  134*  K 4.0 4.0 3.7 4.3 4.4  --  4.3  CL 115* 110 108 105 101  --  98  CO2 23 22 21* 22 21*  --  21*  GLUCOSE 173* 119* 117* 152* 106*  --  133*  BUN 14 14 10 17  42*  --  46*  CREATININE 2.28* 2.15* 2.12* 2.45* 3.52*  --  3.07*  CALCIUM 10.1 9.5 9.4 9.7 9.1 8.7* 8.9  PHOS  --   --   --   --  5.4*  --   --    CBC Recent Labs  Lab 05/21/21 0745 05/22/21 0109 05/26/21 0810 05/27/21 0220  WBC 11.4* 10.6* 15.8* 15.5*  HGB 10.6* 10.4* 10.3* 9.9*  HCT 35.2* 33.0* 31.4* 31.1*  MCV 91.2 89.9 86.3 87.1  PLT 348 277 264 256    Medications:     lamoTRIgine  200 mg Oral Daily   LORazepam  1 mg Oral BID   nicotine  14 mg Transdermal Daily   OLANZapine  10 mg Oral BID   senna  1 tablet Oral Daily   tamsulosin  0.4 mg Oral Daily      Otelia Santee, MD 05/28/2021, 7:27 AM

## 2021-05-28 NOTE — TOC Progression Note (Signed)
Transition of Care [facility]) - Initial/Assessment Note    Patient Details  Name: Megan Miranda MRN: [id number] Date of Birth: May 27, 1960  [facility] CM/SW Contact:    Milinda Antis, [place] Phone Number: 05/28/2021, 8:59 AM  Clinical Narrative:                 Patient's chart reviewed.  The patient's legal guardian has decided on full comfort care for the patient.  Palliative will follow up with the family in 24-48 hours to determine whether the patient will qualify for residential hospice.    TOC will continue to follow for d/c needs.    Expected Discharge Plan: Skilled Nursing Facility Barriers to Discharge: Continued Medical Work up, Ship broker, SNF Pending bed offer   Patient Goals and CMS Choice Patient states their goals for this hospitalization and ongoing recovery are:: Rehab CMS Medicare.gov Compare Post Acute Care list provided to:: Patient Represenative (must comment) Choice offered to / list presented to : [facility] POA / Guardian, Sibling (Brother)  Expected Discharge Plan and Services Expected Discharge Plan: [place] In-house Referral: Clinical Social Work   Post Acute Care Choice: [place] Living arrangements for the past 2 months: [place] Expected Discharge Date: 05/23/21                                    Prior Living Arrangements/Services Living arrangements for the past 2 months: [place] Lives with:: Facility Resident Patient language and need for interpreter reviewed:: Yes Do you feel safe going back to the place where you live?: Yes      Need for Family Participation in Patient Care: Yes (Comment) Care giver support system in place?: Yes (comment)   Criminal Activity/Legal Involvement Pertinent to Current Situation/Hospitalization: No - Comment as needed  Activities of Daily Living Home Assistive Devices/Equipment: Bedside commode/3-in-1 ADL Screening (condition  at time of admission) Patient's cognitive ability adequate to safely complete daily activities?: No Is the patient deaf or have difficulty hearing?: No Does the patient have difficulty seeing, even when wearing glasses/contacts?: No Does the patient have difficulty concentrating, remembering, or making decisions?: No Patient able to express need for assistance with ADLs?: Yes Does the patient have difficulty dressing or bathing?: No Independently performs ADLs?: Yes (appropriate for developmental age) Does the patient have difficulty walking or climbing stairs?: No Weakness of Legs: None Weakness of Arms/Hands: None  Permission Sought/Granted Permission sought to share information with : Facility Sport and exercise psychologist, Family Supports Permission granted to share information with : Yes, Verbal Permission Granted  Share Information with NAME: [name]  Permission granted to share info w AGENCY: [facility]  Permission granted to share info w Relationship: Brother/Legal Guardian  Permission granted to share info w Contact Information: [phone]  Emotional Assessment Appearance:: Appears stated age     Orientation: : Oriented to Self, Oriented to Place, Oriented to  Time, Oriented to Situation Alcohol / Substance Use: Not Applicable Psych Involvement: No (comment)  Admission diagnosis:  Unsteady gait [R26.81] AKI (acute kidney injury) ([place]) [N17.9] Acute kidney injury superimposed on chronic kidney disease ([place]) [N17.9, N18.9] Patient Active Problem List   Diagnosis Date Noted   Delirium due to another medical condition 05/16/2021   Generalized weakness 05/15/2021   AKI (acute kidney injury) ([place]) 05/15/2021   Acute kidney injury superimposed on chronic kidney disease ([place]) 05/14/2021   Schizoaffective  disorder, manic type ([place]) 12/03/2018   SIRS (systemic inflammatory response syndrome) ([place]) 10/16/2017   Lactic acidosis 10/16/2017   Abnormal TSH 10/16/2017   History of  alcohol abuse 10/16/2017   Lithium toxicity 10/15/2017   Bipolar I disorder ([place]) 07/21/2007   PCP:  Patient, No Pcp Per (Inactive) Pharmacy:   [facility] [place], [place] [address] Phone: [phone] Fax: [phone]     Social Determinants of Health (SDOH) Interventions    Readmission Risk Interventions No flowsheet data found.

## 2021-05-28 NOTE — Progress Notes (Signed)
Progress Note    TAYAH IDROVO   PJS:[id number]  DOB: [birth year]  DOA: 05/14/2021     13 PCP: Patient, No Pcp Per (Inactive)  Initial CC: weakness  Hospital Course: Ms. Mulvehill is a 61 yo female with PMH bipolar 1 disorder, schizoaffective disorder, CKD3b who presented to the ER with generalized weakness.  After work-up she was found to have AKI.  She also underwent MRI brain due to worsening mentation which was negative for acute stroke or other acute abnormalities and showed mildly advanced cerebral atrophy. Further work-up revealed liver lesions with concern for metastatic disease.  Patient's brother is her legal guardian due to being deemed incompetent and 2020.  Palliative care was consulted during hospitalization and helped facilitate [place] discussions.  Interval History:  No events overnight.  Patient resting in bed with lunch tray in front of her but was not hungry.  Unable to answer all my questions but she was able to state her regimen she takes for her underlying bipolar.  Assessment & Plan:  1-Acute kidney injury superimposed on chronic kidney disease a stage IIIb -Creatinine baseline  appears to be 1.4--1.6 -Felt to be dehydrated on admission, creatinine peaked to 2.6 - Repeated renal US 1/02; negative for hydronephrosis. -No further fluids after [place] discussions with palliative care   2-Hypernatremia: -Due to poor oral intake - KVO rate of half-normal saline infusing -Continue comfort measures otherwise    3-Liver lesion concerning for metastatic process, abnormal liver function test: MRI revealed multiple lesions in the liver concerning for metastatic process. -CEA elevated at 21.  CA 19-9 alpha-fetoprotein normal -CT abdomen and pelvis did not show primary. Uterine fibroid.  -Dr. Pricilla Handler discussed with patient's brother, apparently patient has been adamantly refusing biopsy.  Plan is to hold on liver biopsy for now.  -Brother is legal guardian.  -referred out  patient hematology oncology clinic.   - Palliative care for goals of care.  Updated: palliative met with brother, decision was to proceed with full comfort care. Palliative will re-evaluate patient in 24--48 hours to determine if patient qualifies for residential hospice.    4-fecal impaction with leakage: CT scan show evidence of fecal impaction in the rectal area -Continue laxative regimen   5-Generalized weakness/unsteady gait: -MRI of the brain did not show any acute finding. CK mildly elevated, B12 1110.  Folic acid normal TSH normal. Weakness improving. -May be discharging to residential hospice   6-History of bipolar disorder/schizoaffective disorder: Continue with Lamictal, Zyprexa and Ativan Was evaluated by psychiatric, they have signed off   Normocytic anemia: Anemia panel unremarkable for any deficiency.  Monitor hemoglobin   Urine retention: Patient required in and out catheterization twice during this hospitalization.  She was a started on Flomax.  She has been able to void on her own.   Left  renal cyst: Noted on renal ultrasound   Leukocytosis; urine culture insignificant growth.     Old records reviewed in assessment of this patient  Antimicrobials:   DVT prophylaxis: SCD  Code Status:   Code Status: DNR  Disposition Plan:  Possible residential hospice Status is: Inpt  Objective: Blood pressure 101/70, pulse (!) 126, temperature 98 F (36.7 C), temperature source Oral, resp. rate 19, height '5\' 5"'  (1.651 m), weight 55.5 kg, SpO2 95 %.  Examination:  Physical Exam Constitutional:      Comments: Awake alert and following commands but appears chronically ill  HENT:     Head: Normocephalic and atraumatic.     Mouth/Throat:  Mouth: Mucous membranes are moist.  Eyes:     Extraocular Movements: Extraocular movements intact.  Cardiovascular:     Rate and Rhythm: Normal rate and regular rhythm.  Pulmonary:     Effort: Pulmonary effort is normal.      Breath sounds: Normal breath sounds.  Abdominal:     General: Bowel sounds are normal. There is no distension.     Palpations: Abdomen is soft.     Tenderness: There is no abdominal tenderness.  Musculoskeletal:        General: Normal range of motion.     Cervical back: Normal range of motion and neck supple.  Skin:    General: Skin is warm and dry.  Neurological:     Mental Status: Mental status is at baseline.  Psychiatric:        Behavior: Behavior normal.     Consultants:  Palliative care Oncology  Procedures:    Data Reviewed: I have personally reviewed labs and imaging studies    LOS: 13 days  Time spent: Greater than 50% of the 35 minute visit was spent in counseling/coordination of care for the patient as laid out in the A&P.   Dwyane Dee, MD [facility] 05/28/2021, 3:56 PM

## 2021-05-28 NOTE — Hospital Course (Signed)
Ms. Alaimo is a 61 yo female with PMH bipolar 1 disorder, schizoaffective disorder, CKD3b who presented to the ER with generalized weakness.  After work-up she was found to have AKI.  She also underwent MRI brain due to worsening mentation which was negative for acute stroke or other acute abnormalities and showed mildly advanced cerebral atrophy. Further work-up revealed liver lesions with concern for metastatic disease.  Patient's brother is her legal guardian due to being deemed incompetent and 2020.  Palliative care was consulted during hospitalization and helped facilitate [place] discussions.

## 2021-05-29 DIAGNOSIS — N179 Acute kidney failure, unspecified: Secondary | ICD-10-CM | POA: Diagnosis not present

## 2021-05-29 DIAGNOSIS — R531 Weakness: Secondary | ICD-10-CM | POA: Diagnosis not present

## 2021-05-29 DIAGNOSIS — F05 Delirium due to known physiological condition: Secondary | ICD-10-CM | POA: Diagnosis not present

## 2021-05-29 DIAGNOSIS — F319 Bipolar disorder, unspecified: Secondary | ICD-10-CM | POA: Diagnosis not present

## 2021-05-29 DIAGNOSIS — N189 Chronic kidney disease, unspecified: Secondary | ICD-10-CM | POA: Diagnosis not present

## 2021-05-29 LAB — BASIC METABOLIC PANEL
Anion gap: 14 (ref 5–15)
BUN: 51 mg/dL — ABNORMAL HIGH (ref 8–23)
CO2: 21 mmol/L — ABNORMAL LOW (ref 22–32)
Calcium: 9.2 mg/dL (ref 8.9–10.3)
Chloride: 100 mmol/L (ref 98–111)
Creatinine, Ser: 2.73 mg/dL — ABNORMAL HIGH (ref 0.44–1.00)
GFR, Estimated: 19 mL/min — ABNORMAL LOW (ref >60)
Glucose, Bld: 163 mg/dL — ABNORMAL HIGH (ref 70–99)
Potassium: 3.6 mmol/L (ref 3.5–5.1)
Sodium: 135 mmol/L (ref 135–145)

## 2021-05-29 NOTE — Plan of Care (Signed)
  Problem: Activity: Goal: Risk for activity intolerance will decrease Outcome: Not Progressing   

## 2021-05-29 NOTE — Progress Notes (Signed)
[facility] Progress Note   61 y.o. female PMH bipolar disorder, HTN, CKD 3b presenting with an unstable gait and weakness from an assisted living facility.  She was found in the ED to have a BUN/Cr of 15/2.68 with a baseline creatinine of 1.4-1.6. Urinalysis did not demonstrate an active sediment.  Patient was given 1L NS on 12/21 and then admitted. Patient found to have liver lesions on renal ultrasound -> MRI which confirmed numerous bilateral hepatic metastatic lesions largest centered in the medial segment LEFT hepatic lobe w/ bulky upper abdominal lymphadenopathy and periportal LAD.  Pt also received FLEET x2 on 12/27 and 12/28. UOP has been decent in the 1L -1.9L range the past few days prior to consultation. She also had a hypotensive episode as well  Assessment/ Plan:   Acute kidney injury superimposed on CKD stage IIIb: Creatinine 2.68 on admission, previous baseline appears to be 1.4-1.6.  She was prerenal on admission. Renal ultrasound did not show hydronephrosis but the bladder was distended. Kidney injury likely from early ATN + prerenal azotemia. Li level <0.06 and she has not been on Li during this hospitalization. - She does have an appetite. UOP is good; she's drinking a lot of water. - Fortunately no absolute indication for RRT; with all the hepatic lesions and LAD very reasonable to get palliative involved.  She has an appetite and fortunately renal function was slowly improving as of 2 days ago; will order a chemistry panel for today.  -Monitor Daily I/Os, Daily weight  -Maintain MAP>65 for optimal renal perfusion.  -Avoid nephrotoxic medications including NSAIDs    Generalized weakness/abnormal gait/fall at facility: Patient reports generalized weakness with falls at her facility.  No focal neurological deficit on admission.  Requiring 2 person assist to ambulate in the ED.  MRI brain negative for acute abnormality.    Bipolar 1/schizoaffective  disorder: Per primary on  home Lamictal 200 mg daily, Zyprexa 10 mg twice daily, Ativan 1 mg twice daily.  Subjective:   Feels better. Denies f/c/n/v/ dyspnea. Has an appetite but states too much food on the tray. Drinking a lot of water.    Objective:   BP 110/67 (BP Location: Right Arm)    Pulse (!) 108    Temp 97.9 F (36.6 C)    Resp 19    Ht 5\' 5"  (1.651 m)    Wt 55.5 kg    SpO2 94%    BMI 20.36 kg/m   Intake/Output Summary (Last 24 hours) at 05/29/2021 1191 Last data filed at 05/29/2021 4782 Gross per 24 hour  Intake 980 ml  Output 975 ml  Net 5 ml   Weight change:   Physical Exam: GEN: NAD, much more alert today NCAT HEENT: No conjunctival pallor, EOMI NECK: Supple, no thyromegaly LUNGS: CTA B/L no rales, rhonchi or wheezing CV: RRR, No M/R/G ABD: SNDNT +BS  EXT: No lower extremity edema, tremors present   Imaging: DG Abd 1 View  Result Date: 05/27/2021 CLINICAL DATA:  Abdominal pain, constipation EXAM: ABDOMEN - 1 VIEW COMPARISON:  None. FINDINGS: Bowel gas pattern is nonspecific. Large amount of stool is seen in colon. Lower portion of pelvis is not included in the radiograph limiting evaluation of rectum. No abnormal masses or calcifications are seen. Kidneys are partly obscured by bowel contents. IMPRESSION: Large amount of stool is seen in the colon. Bowel gas pattern is otherwise unremarkable. Electronically Signed   By: Elmer Picker M.D.   On: 05/27/2021 10:28  Labs: BMET Recent Labs  Lab 05/23/21 0724 05/24/21 1611 05/26/21 0810 05/26/21 1402 05/27/21 0220  NA 141 140 137  --  134*  K 3.7 4.3 4.4  --  4.3  CL 108 105 101  --  98  CO2 21* 22 21*  --  21*  GLUCOSE 117* 152* 106*  --  133*  BUN 10 17 42*  --  46*  CREATININE 2.12* 2.45* 3.52*  --  3.07*  CALCIUM 9.4 9.7 9.1 8.7* 8.9  PHOS  --   --  5.4*  --   --    CBC Recent Labs  Lab 05/26/21 0810 05/27/21 0220  WBC 15.8* 15.5*  HGB 10.3* 9.9*  HCT 31.4* 31.1*  MCV 86.3 87.1  PLT 264  256    Medications:     lamoTRIgine  200 mg Oral Daily   LORazepam  1 mg Oral BID   nicotine  14 mg Transdermal Daily   OLANZapine  10 mg Oral BID   senna  1 tablet Oral Daily   tamsulosin  0.4 mg Oral Daily      Otelia Santee, MD 05/29/2021, 7:11 AM

## 2021-05-29 NOTE — Progress Notes (Signed)
°Progress Note ° ° ° °Megan Miranda   °MRN:[id number]  °DOB: 05/14/1961  °DOA: 05/14/2021     14 °PCP: Patient, No Pcp Per (Inactive) ° °Initial CC: weakness ° °Hospital Course: °Ms. Megan Miranda is a 60 yo female with PMH bipolar 1 disorder, schizoaffective disorder, CKD3b who presented to the ER with generalized weakness.  After work-up she was found to have AKI.  She also underwent MRI brain due to worsening mentation which was negative for acute stroke or other acute abnormalities and showed mildly advanced cerebral atrophy. °Further work-up revealed liver lesions with concern for metastatic disease.  Patient's brother is her legal guardian due to being deemed incompetent and 2020.  Palliative care was consulted during hospitalization and helped facilitate GOC discussions. ° °Interval History:  °No events overnight.  Lunch tray noted during rounds and she had not eaten any of her sandwich.  She had no complaints when asked. ° °Assessment & Plan: ° °1-Acute kidney injury superimposed on chronic kidney disease a stage IIIb °-Creatinine baseline  appears to be 1.4--1.6 °-Felt to be dehydrated on admission, creatinine peaked to 3.52 °- Repeated renal US 1/02; negative for hydronephrosis. °- No further fluids after GOC discussions with palliative care °  °2-Hypernatremia: resolved °-Due to poor oral intake °- d/c KVO fluid °-Continue comfort measures otherwise  °  °3-Liver lesion concerning for metastatic process, abnormal liver function test: °MRI revealed multiple lesions in the liver concerning for metastatic process. °-CEA elevated at 21.  CA 19-9 alpha-fetoprotein normal °-CT abdomen and pelvis did not show primary. Uterine fibroid.  °-Dr. Krishna discussed with patient's brother, apparently patient has been adamantly refusing biopsy.  Plan is to hold on liver biopsy for now.  °-Brother is legal guardian.  °-referred out patient hematology oncology clinic.  ° °Palliative care for goals of care.  °Updated: palliative  met with brother, decision was to proceed with full comfort care. Palliative will continue to follow to determine if patient qualifies for residential hospice.  °  °4-fecal impaction with leakage: °CT scan show evidence of fecal impaction in the rectal area °-Continue laxative regimen °  °5-Generalized weakness/unsteady gait: °-MRI of the brain did not show any acute finding. °CK mildly elevated, B12 1110.  Folic acid normal TSH normal. °Weakness improving. °-May be discharging to residential hospice °  °6-History of bipolar disorder/schizoaffective disorder: °Continue with Lamictal, Zyprexa and Ativan °Was evaluated by psychiatric, they have signed off °  °Normocytic anemia: Anemia panel unremarkable for any deficiency.  Monitor hemoglobin °  °Urine retention: Patient required in and out catheterization twice during this hospitalization.  She was a started on Flomax.  She has been able to void on her own. °  °Left  renal cyst: Noted on renal ultrasound °  °Leukocytosis; urine culture insignificant growth.  °  ° °Old records reviewed in assessment of this patient ° °Antimicrobials: ° ° °DVT prophylaxis: SCD ° °Code Status:   Code Status: DNR ° °Disposition Plan:  Possible residential hospice °Status is: Inpt ° °Objective: °Blood pressure 112/72, pulse (!) 102, temperature 98 °F (36.7 °C), temperature source Oral, resp. rate 18, height 5' 5" (1.651 m), weight 55.5 kg, SpO2 96 %.  °Examination:  °Physical Exam °Constitutional:   °   Comments: Awake alert and following commands but appears chronically ill  °HENT:  °   Head: Normocephalic and atraumatic.  °   Mouth/Throat:  °   Mouth: Mucous membranes are moist.  °Eyes:  °   Extraocular Movements: Extraocular movements   movements intact.  Cardiovascular:     Rate and Rhythm: Normal rate and regular rhythm.  Pulmonary:     Effort: Pulmonary effort is normal.     Breath sounds: Normal breath sounds.  Abdominal:     General: Bowel sounds are normal. There is no distension.      Palpations: Abdomen is soft.     Tenderness: There is no abdominal tenderness.  Musculoskeletal:        General: Normal range of motion.     Cervical back: Normal range of motion and neck supple.  Skin:    General: Skin is warm and dry.  Neurological:     Mental Status: Mental status is at baseline.  Psychiatric:        Behavior: Behavior normal.     Consultants:  Palliative care Oncology  Procedures:    Data Reviewed: I have personally reviewed labs and imaging studies    LOS: 14 days  Time spent: Greater than 50% of the 35 minute visit was spent in counseling/coordination of care for the patient as laid out in the A&P.   Megan Dee, MD [facility] 05/29/2021, 3:10 PM

## 2021-05-29 NOTE — Progress Notes (Signed)
Patient ID: ESPYN RADWAN, female   DOB: 05-01-1961, 62 y.o.   MRN: [id number]    Progress Note from the Palliative Medicine Team at [facility]   Patient Name: Megan Miranda        Date: 05/29/2021 DOB: 16-Dec-1960  Age: 61 y.o. MRN#: [id number] Attending Physician: Dwyane Dee, MD Primary Care Physician: Patient, No Pcp Per (Inactive) Admit Date: 05/14/2021   Medical records reviewed   61 y.o. female  admitted on 05/14/2021 with  medical history significant for bipolar 1 disorder, schizoaffective disorder, CKD stage IIIb who presented to the ED for evaluation of generalized weakness/unstable gait.  Patient  currently living at an assisted living facility/ [facility].  Patient's brother is her legal guardian,  patient does not have capacity for medical decisions.   Patient found to have acute kidney injury/creatinine 2.68 on admission and incidentally found of multiple liver lesions on renal ultrasound, biopsy declined.   Patient admitted for treatment and stabilization.  This NP visited patient at the bedside as a follow up for palliative medicine needs and emotional support. Patient has continues with weakness, poor oral intake; she is overall failing to thrive.   Decision has been made by legal guardian to shift to full comfort focusing on comfort and dignity.  Spoke to brother/legal [employer], by telephone today .    He is comfortable with decision for comfort at this time in patient's life.  Again today education offered detailing the difference between an aggressive medical intervention path and a palliative comfort path for this patient at this time in this situation.  Education offered on the natural trajectory and expectations at EOL.  Education offered specifically on natural dehydration in the dying process.   As reflected on  completed MOST form there is no desire for life prolonging measures,  focus is to allow for a natural death. -DNR/DNI -No artificial  feeding now or in the future -No antibiotic use unless for comfort -No IV fluids -No further diagnostics or life prolonging measures -Continue to evaluate for appropriate transition of care.  Education offered on hospice benefit philosophy and eligibility.   Patient will be unable to return to her assisted living, education offered on the difference between skilled nursing facility for custodial care versus residential hospice for end-of-life care.  As discussed with transition of care team patient will be a difficult placement.  For now monitor patient over the next few days, disposition dependent on outcomes.  Questions and concerns addressed   Discussed with Dr Sabino Gasser and Lind Covert LCSW  This nurse practitioner informed  the family and the nursing staff/TOC that I will be out of the hospital until Monday morning.   I will follow-up at that time.  Call palliative medicine team phone # [phone] with questions or concerns in the interim.   PMT will continue to support holistically  Wadie Lessen NP  Palliative Medicine Team Team Phone # [phone] Pager [phone]

## 2021-05-30 DIAGNOSIS — F319 Bipolar disorder, unspecified: Secondary | ICD-10-CM | POA: Diagnosis not present

## 2021-05-30 DIAGNOSIS — F05 Delirium due to known physiological condition: Secondary | ICD-10-CM | POA: Diagnosis not present

## 2021-05-30 DIAGNOSIS — R627 Adult failure to thrive: Secondary | ICD-10-CM | POA: Diagnosis not present

## 2021-05-30 DIAGNOSIS — R531 Weakness: Secondary | ICD-10-CM | POA: Diagnosis not present

## 2021-05-30 NOTE — Progress Notes (Signed)
[employer]ufacturing [employer] [facility]) Community Based Palliative Care     This patient is enrolled in our palliative care services in the community.  [facility] will continue to follow for any discharge planning needs and to coordinate continuation of palliative care.  If you have questions or need assistance, please call [phone] or contact the hospital Liaison listed on AMION.      Farrel Gordon, RN, [facility] Liaison

## 2021-05-30 NOTE — Plan of Care (Signed)
  Problem: Activity: Goal: Risk for activity intolerance will decrease Outcome: Progressing   Problem: Nutrition: Goal: Adequate nutrition will be maintained Outcome: Progressing   Problem: Coping: Goal: Level of anxiety will decrease Outcome: Progressing   

## 2021-05-30 NOTE — TOC Progression Note (Addendum)
Transition of Care [facility]) - Initial/Assessment Note    Patient Details  Name: Megan Miranda MRN: [id number] Date of Birth: 12-12-1960  Transition of Care [facility]) CM/SW Contact:    Milinda Antis, [place] Phone Number: 05/30/2021, 12:57 PM  Clinical Narrative:                 CSW spoke with [name] at [facility] and was informed that the patient can return to the ALF with hospice.    CSW attempted to contact the patient's brother/legal guardian to inform of this information and receive his consent.   There was no answer.  CSW left a VM asking for a returned call.  CSW was contacted by the son's representative as the son is traveling on business and unable to talk.  The representative, [name] [phone]) stated that the family was informed by [name], director of resident care coordination that the patient could not return.  CSW contacted [facility] with admissions and informed her of this information.  [name] will speak with Imane and inform CSW of the outcome.    Expected Discharge Plan: Skilled Nursing Facility Barriers to Discharge: Continued Medical Work up, Ship broker, SNF Pending bed offer   Patient Goals and CMS Choice Patient states their goals for this hospitalization and ongoing recovery are:: Rehab CMS Medicare.gov Compare Post Acute Care list provided to:: Patient Represenative (must comment) Choice offered to / list presented to : [facility] POA / Guardian, Sibling (Brother)  Expected Discharge Plan and Services Expected Discharge Plan: [place] In-house Referral: Clinical Social Work   Post Acute Care Choice: [place] Living arrangements for the past 2 months: [place] Expected Discharge Date: 05/23/21                                    Prior Living Arrangements/Services Living arrangements for the past 2 months: [place] Lives with:: Facility Resident Patient language and need for  interpreter reviewed:: Yes Do you feel safe going back to the place where you live?: Yes      Need for Family Participation in Patient Care: Yes (Comment) Care giver support system in place?: Yes (comment)   Criminal Activity/Legal Involvement Pertinent to Current Situation/Hospitalization: No - Comment as needed  Activities of Daily Living Home Assistive Devices/Equipment: Bedside commode/3-in-1 ADL Screening (condition at time of admission) Patient's cognitive ability adequate to safely complete daily activities?: No Is the patient deaf or have difficulty hearing?: No Does the patient have difficulty seeing, even when wearing glasses/contacts?: No Does the patient have difficulty concentrating, remembering, or making decisions?: No Patient able to express need for assistance with ADLs?: Yes Does the patient have difficulty dressing or bathing?: No Independently performs ADLs?: Yes (appropriate for developmental age) Does the patient have difficulty walking or climbing stairs?: No Weakness of Legs: None Weakness of Arms/Hands: None  Permission Sought/Granted Permission sought to share information with : Facility Sport and exercise psychologist, Family Supports Permission granted to share information with : Yes, Verbal Permission Granted  Share Information with NAME: [name]  Permission granted to share info w AGENCY: [facility]  Permission granted to share info w Relationship: Brother/Legal Guardian  Permission granted to share info w Contact Information: [phone]  Emotional Assessment Appearance:: Appears stated age     Orientation: : Oriented to Self, Oriented to Place, Oriented to  Time, Oriented to Situation Alcohol / Substance Use: Not Applicable  Psych Involvement: No (comment)  Admission diagnosis:  Unsteady gait [R26.81] AKI (acute kidney injury) ([place]) [N17.9] Acute kidney injury superimposed on chronic kidney disease ([place]) [N17.9, N18.9] Patient Active Problem List    Diagnosis Date Noted   Delirium due to another medical condition 05/16/2021   Generalized weakness 05/15/2021   AKI (acute kidney injury) ([place]) 05/15/2021   Acute kidney injury superimposed on chronic kidney disease ([place]) 05/14/2021   Schizoaffective disorder, manic type ([place]) 12/03/2018   SIRS (systemic inflammatory response syndrome) ([place]) 10/16/2017   Lactic acidosis 10/16/2017   Abnormal TSH 10/16/2017   History of alcohol abuse 10/16/2017   Lithium toxicity 10/15/2017   Bipolar I disorder ([place]) 07/21/2007   PCP:  Patient, No Pcp Per (Inactive) Pharmacy:   [facility] [address] [place] [address] Phone: [phone] Fax: [phone]     Social Determinants of Health (SDOH) Interventions    Readmission Risk Interventions No flowsheet data found.

## 2021-05-30 NOTE — Progress Notes (Signed)
Progress Note    Megan Miranda   KPT:[id number]  DOB: 04-21-1961  DOA: 05/14/2021     15 PCP: Patient, No Pcp Per (Inactive)  Initial CC: weakness  Hospital Course: Megan Miranda is a 61 yo female with PMH bipolar 1 disorder, schizoaffective disorder, CKD3b who presented to the ER with generalized weakness.  After work-up she was found to have AKI.  She also underwent MRI brain due to worsening mentation which was negative for acute stroke or other acute abnormalities and showed mildly advanced cerebral atrophy. Further work-up revealed liver lesions with concern for metastatic disease.  Patient's brother is her legal guardian due to being deemed incompetent and 2020.  Palliative care was consulted during hospitalization and helped facilitate [place] discussions.  Interval History:  No events overnight.  Resting in bed this morning awake and alert.  She denies any complaints of pain or otherwise discomfort. Palliative care spoke with family yesterday and patient is now considered on full comfort care with hospice.  Assessment & Plan:  1-Acute kidney injury superimposed on chronic kidney disease stage IIIb -Creatinine baseline  appears to be 1.4--1.6 -Felt to be dehydrated on admission, creatinine peaked to 3.52 - Repeated renal US 1/02; negative for hydronephrosis. - No further fluids after [place] discussions with palliative care   2-Hypernatremia: resolved -Due to poor oral intake; continues to have very poor meal intake - d/c KVO fluid -Continue comfort measures otherwise    3-Liver lesion concerning for metastatic process, abnormal liver function test: MRI revealed multiple lesions in the liver concerning for metastatic process. -CEA elevated at 21.  CA 19-9 alpha-fetoprotein normal -CT abdomen and pelvis did not show primary. Uterine fibroid.  -Dr. Pricilla Handler discussed with patient's brother, apparently patient has been adamantly refusing biopsy.  Plan is to hold on liver biopsy for now.   -Brother is legal guardian.  -referred out patient hematology oncology clinic.   Palliative care for goals of care.  Updated: palliative met with brother, decision was to proceed with full comfort care. Palliative will continue to follow to determine if patient qualifies for residential hospice.  She may be able to return to her ALF with hospice   4-fecal impaction with leakage: CT scan show evidence of fecal impaction in the rectal area -Continue laxative regimen   5-Generalized weakness/unsteady gait: -MRI of the brain did not show any acute finding. CK mildly elevated, B12 1110.  Folic acid normal TSH normal. Weakness improving. -May be discharging to residential hospice vs ALF with hospice    6-History of bipolar disorder/schizoaffective disorder: Continue with Lamictal, Zyprexa and Ativan Was evaluated by psychiatric, they have signed off   Normocytic anemia: Anemia panel unremarkable for any deficiency.  Monitor hemoglobin   Urine retention: Patient required in and out catheterization twice during this hospitalization.  She was a started on Flomax.  She has been able to void on her own.   Left  renal cyst: Noted on renal ultrasound   Leukocytosis; urine culture insignificant growth.     Old records reviewed in assessment of this patient  Antimicrobials:   DVT prophylaxis: SCD  Code Status:   Code Status: DNR  Disposition Plan:  Possible residential hospice Status is: Inpt  Objective: Blood pressure 98/62, pulse (!) 116, temperature 98 F (36.7 C), resp. rate 18, height '5\' 5"'  (1.651 m), weight 55.5 kg, SpO2 98 %.  Examination:  Physical Exam Constitutional:      Comments: Awake alert and following commands but appears chronically ill  HENT:     Head: Normocephalic and atraumatic.     Mouth/Throat:     Mouth: Mucous membranes are moist.  Eyes:     Extraocular Movements: Extraocular movements intact.  Cardiovascular:     Rate and Rhythm: Normal rate and  regular rhythm.  Pulmonary:     Effort: Pulmonary effort is normal.     Breath sounds: Normal breath sounds.  Abdominal:     General: Bowel sounds are normal. There is no distension.     Palpations: Abdomen is soft.     Tenderness: There is no abdominal tenderness.  Musculoskeletal:        General: Normal range of motion.     Cervical back: Normal range of motion and neck supple.  Skin:    General: Skin is warm and dry.  Neurological:     Mental Status: Mental status is at baseline.  Psychiatric:        Behavior: Behavior normal.     Consultants:  Palliative care Oncology  Procedures:    Data Reviewed: I have personally reviewed labs and imaging studies    LOS: 15 days  Time spent: 51 min  Dwyane Dee, MD [facility] 05/30/2021, 1:03 PM

## 2021-05-31 DIAGNOSIS — F05 Delirium due to known physiological condition: Secondary | ICD-10-CM | POA: Diagnosis not present

## 2021-05-31 DIAGNOSIS — R531 Weakness: Secondary | ICD-10-CM | POA: Diagnosis not present

## 2021-05-31 NOTE — Progress Notes (Signed)
Progress Note    Megan Miranda   [id number]:[id number]  DOB: [birth year]  DOA: 05/14/2021     16 PCP: Patient, No Pcp Per (Inactive)  Initial CC: weakness  Hospital Course: Megan Miranda is a 60 yo female with PMH bipolar 1 disorder, schizoaffective disorder, CKD3b who presented to the ER with generalized weakness.  After work-up she was found to have AKI.  She also underwent MRI brain due to worsening mentation which was negative for acute stroke or other acute abnormalities and showed mildly advanced cerebral atrophy. Further work-up revealed liver lesions with concern for metastatic disease.  Patient's brother is her legal guardian due to being deemed incompetent and 2020.  Palliative care was consulted during hospitalization and helped facilitate [place] discussions.  Interval History:  No events overnight.  Eating some lunch when seen this afternoon.  Otherwise she had no complaints as usual.  Assessment & Plan:  1-Acute kidney injury superimposed on chronic kidney disease stage IIIb -Creatinine baseline  appears to be 1.4--1.6 -Felt to be dehydrated on admission, creatinine peaked to 3.52 - Repeated renal US 1/02; negative for hydronephrosis. - No further fluids after [place] discussions with palliative care   2-Hypernatremia: resolved -Due to poor oral intake; continues to have very poor meal intake - d/c fluids -Continue comfort measures otherwise    3-Liver lesion concerning for metastatic process, abnormal liver function test: MRI revealed multiple lesions in the liver concerning for metastatic process. -CEA elevated at 21.  CA 19-9 alpha-fetoprotein normal -CT abdomen and pelvis did not show primary. Uterine fibroid.  -Dr. Pricilla Handler discussed with patient's brother, apparently patient has been adamantly refusing biopsy.  Plan is to hold on liver biopsy for now.  -Brother is legal guardian.  -referred out patient hematology oncology clinic.   Palliative care for goals of care.   Updated: palliative met with brother, decision was to proceed with full comfort care. Palliative will continue to follow to determine if patient qualifies for residential hospice.  She may be able to return to her ALF with hospice   4-fecal impaction with leakage: CT scan show evidence of fecal impaction in the rectal area -Continue laxative regimen   5-Generalized weakness/unsteady gait: -MRI of the brain did not show any acute finding. CK mildly elevated, B12 1110.  Folic acid normal TSH normal. Weakness improving. -May be discharging to residential hospice vs ALF with hospice    6-History of bipolar disorder/schizoaffective disorder: Continue with Lamictal, Zyprexa and Ativan Was evaluated by psychiatric, they have signed off   Normocytic anemia: Anemia panel unremarkable for any deficiency.  Monitor hemoglobin   Urine retention: Patient required in and out catheterization twice during this hospitalization.  She was a started on Flomax.  She has been able to void on her own.   Left  renal cyst: Noted on renal ultrasound   Leukocytosis; urine culture insignificant growth.     Old records reviewed in assessment of this patient  Antimicrobials:   DVT prophylaxis: SCD  Code Status:   Code Status: DNR  Disposition Plan:  Possible residential hospice Status is: Inpt  Objective: Blood pressure 102/62, pulse 100, temperature 98 F (36.7 C), temperature source Oral, resp. rate 16, height '5\' 5"'  (1.651 m), weight 55.5 kg, SpO2 98 %.  Examination:  Physical Exam Constitutional:      Comments: Awake alert and following commands but appears chronically ill  HENT:     Head: Normocephalic and atraumatic.     Mouth/Throat:     Mouth:  Mucous membranes are moist.  Eyes:     Extraocular Movements: Extraocular movements intact.  Cardiovascular:     Rate and Rhythm: Normal rate and regular rhythm.  Pulmonary:     Effort: Pulmonary effort is normal.     Breath sounds: Normal  breath sounds.  Abdominal:     General: Bowel sounds are normal. There is no distension.     Palpations: Abdomen is soft.     Tenderness: There is no abdominal tenderness.  Musculoskeletal:        General: Normal range of motion.     Cervical back: Normal range of motion and neck supple.  Skin:    General: Skin is warm and dry.  Neurological:     Mental Status: Mental status is at baseline.  Psychiatric:        Behavior: Behavior normal.     Consultants:  Palliative care Oncology  Procedures:    Data Reviewed: I have personally reviewed labs and imaging studies    LOS: 16 days  Time spent: 86 min  Dwyane Dee, MD [facility] 05/31/2021, 2:19 PM

## 2021-06-01 DIAGNOSIS — Z7189 Other specified counseling: Secondary | ICD-10-CM

## 2021-06-01 DIAGNOSIS — F05 Delirium due to known physiological condition: Secondary | ICD-10-CM | POA: Diagnosis not present

## 2021-06-01 DIAGNOSIS — R338 Other retention of urine: Secondary | ICD-10-CM | POA: Diagnosis not present

## 2021-06-01 NOTE — Progress Notes (Signed)
Progress Note    Megan Miranda   ZSM:[id number]  DOB: February 23, 1961  DOA: 05/14/2021     17 PCP: Patient, No Pcp Per (Inactive)  Initial CC: weakness  Hospital Course: Megan Miranda is a 61 yo female with PMH bipolar 1 disorder, schizoaffective disorder, CKD3b who presented to the ER with generalized weakness.  After work-up she was found to have AKI.  She also underwent MRI brain due to worsening mentation which was negative for acute stroke or other acute abnormalities and showed mildly advanced cerebral atrophy. Further work-up revealed liver lesions with concern for metastatic disease.  Patient's brother is her legal guardian due to being deemed incompetent and 2020.  Palliative care was consulted during hospitalization and helped facilitate [place] discussions.  Interval History:  No events overnight.  Resting in bed in no distress.  Denied any complaints when asked this morning.  She appears comfortable.  Assessment & Plan:  AKI on CKD3b -Creatinine baseline  appears to be 1.4--1.6 -Felt to be dehydrated on admission, creatinine peaked to 3.52 - Repeated renal US 1/02; negative for hydronephrosis. - No further fluids after [place] discussions with palliative care   Hypernatremia: resolved -Due to poor oral intake; continues to have very poor meal intake - d/c fluids -Continue comfort measures otherwise    Liver lesion concerning for metastatic process, abnormal liver function test MRI revealed multiple lesions in the liver concerning for metastatic process. -CEA elevated at 21.  CA 19-9 alpha-fetoprotein normal -CT abdomen and pelvis did not show primary. Uterine fibroid.  -Dr. Pricilla Handler discussed with patient's brother, apparently patient has been adamantly refusing biopsy.  Plan is to hold on liver biopsy for now.  -Brother is legal guardian.  -referred out patient hematology oncology clinic.   Palliative care for goals of care. Updated: palliative met with brother, decision was to  proceed with full comfort care. Palliative will continue to follow to determine if patient qualifies for residential hospice.  She may be able to return to her ALF with hospice   Constipation CT scan show evidence of fecal impaction in the rectal area -Continue laxative regimen   Generalized weakness/unsteady gait -MRI of the brain did not show any acute finding. CK mildly elevated, B12 1110.  Folic acid normal TSH normal. Weakness improving. -May be discharging to residential hospice vs ALF with hospice    History of bipolar disorder/schizoaffective disorder Continue with Lamictal, Zyprexa and Ativan Was evaluated by psychiatric, they have signed off   Normocytic anemia - Anemia panel unremarkable for any deficiency   Urine retention -Indwelling Foley placed in setting of pursuing hospice/comfort care   Left  renal cyst: Noted on renal ultrasound   Leukocytosis; urine culture insignificant growth.     Old records reviewed in assessment of this patient  Antimicrobials:   DVT prophylaxis: SCD  Code Status:   Code Status: DNR  Disposition Plan:  Possible residential hospice vs ALF with hospice in place Status is: Inpt  Objective: Blood pressure 107/73, pulse 100, temperature 98 F (36.7 C), temperature source Axillary, resp. rate 18, height _0  (1.651 m), weight 55.5 kg, SpO2 100 %.  Examination:  Physical Exam Constitutional:      Comments: Awake alert and following commands but appears chronically ill  HENT:     Head: Normocephalic and atraumatic.     Mouth/Throat:     Mouth: Mucous membranes are moist.  Eyes:     Extraocular Movements: Extraocular movements intact.  Cardiovascular:     Rate  and Rhythm: Normal rate and regular rhythm.  Pulmonary:     Effort: Pulmonary effort is normal.     Breath sounds: Normal breath sounds.  Abdominal:     General: Bowel sounds are normal. There is no distension.     Palpations: Abdomen is soft.     Tenderness: There is  no abdominal tenderness.  Musculoskeletal:        General: Normal range of motion.     Cervical back: Normal range of motion and neck supple.  Skin:    General: Skin is warm and dry.  Neurological:     Mental Status: Mental status is at baseline.  Psychiatric:        Behavior: Behavior normal.     Consultants:  Palliative care Oncology  Procedures:    Data Reviewed: I have personally reviewed labs and imaging studies    LOS: 17 days  Time spent: 9 min  Dwyane Dee, MD [facility] 06/01/2021, 1:14 PM

## 2021-06-02 DIAGNOSIS — N179 Acute kidney failure, unspecified: Secondary | ICD-10-CM | POA: Diagnosis not present

## 2021-06-02 DIAGNOSIS — R1310 Dysphagia, unspecified: Secondary | ICD-10-CM

## 2021-06-02 DIAGNOSIS — F05 Delirium due to known physiological condition: Secondary | ICD-10-CM | POA: Diagnosis not present

## 2021-06-02 DIAGNOSIS — Z7189 Other specified counseling: Secondary | ICD-10-CM | POA: Diagnosis not present

## 2021-06-02 DIAGNOSIS — F319 Bipolar disorder, unspecified: Secondary | ICD-10-CM | POA: Diagnosis not present

## 2021-06-02 DIAGNOSIS — N1832 Chronic kidney disease, stage 3b: Secondary | ICD-10-CM

## 2021-06-02 DIAGNOSIS — K769 Liver disease, unspecified: Secondary | ICD-10-CM | POA: Diagnosis not present

## 2021-06-02 DIAGNOSIS — R627 Adult failure to thrive: Secondary | ICD-10-CM | POA: Diagnosis not present

## 2021-06-02 NOTE — Progress Notes (Signed)
Progress Note    ROSHELL BRIGHAM   [id number]  DOB: 1960-08-26  DOA: 05/14/2021     18 PCP: Patient, No Pcp Per (Inactive)  Initial CC: weakness  Hospital Course: Ms. Herne is a 61 yo female with PMH bipolar 1 disorder, schizoaffective disorder, CKD3b who presented to the ER with generalized weakness.  After work-up she was found to have AKI.  She also underwent MRI brain due to worsening mentation which was negative for acute stroke or other acute abnormalities and showed mildly advanced cerebral atrophy. Further work-up revealed liver lesions with concern for metastatic disease.  Patient's brother is her legal guardian due to being deemed incompetent and 2020.  Palliative care was consulted during hospitalization and helped facilitate [place] discussions.  Interval History:  No events overnight.  Resting in bed in no distress.   Awake for me as usual. Never has any complaints; just comfortable in bed.  Reviewed her meal intake over the past few days, she is very labile with this sometimes eating 50% sometimes eating 0%.  Assessment & Plan:  Palliative care for goals of care. - palliative met with brother, decision was to proceed with full comfort care. Palliative will continue to follow to determine if patient qualifies for residential hospice.   - appears she cannot return to her ALF (requiring higher level of care); CM/SW assisting still to see if could possibly return with 24/7 care and/or hospice in place; if she is unable to be placed then she will require hospitalization until she is accepted by residential hospice   AKI on CKD3b -Creatinine baseline  appears to be 1.4--1.6 -Felt to be dehydrated on admission, creatinine peaked to 3.52 - Repeated renal US 1/02; negative for hydronephrosis. - No further fluids after [place] discussions with palliative care   Hypernatremia: resolved -Due to poor oral intake; continues to have very poor meal intake - d/c fluids -Continue comfort  measures otherwise    Liver lesion concerning for metastatic process, abnormal liver function test MRI revealed multiple lesions in the liver concerning for metastatic process. -CEA elevated at 21.  CA 19-9 alpha-fetoprotein normal -CT abdomen and pelvis did not show primary. Uterine fibroid.  -Dr. Pricilla Handler discussed with patient's brother, apparently patient has been adamantly refusing biopsy.  Plan is to hold on liver biopsy for now.  -Brother is legal guardian.  -referred out patient hematology oncology clinic.    Constipation CT scan show evidence of fecal impaction in the rectal area -Continue laxative regimen   Generalized weakness/unsteady gait -MRI of the brain did not show any acute finding. CK mildly elevated, B12 1110.  Folic acid normal TSH normal. Weakness improving. -May be discharging to residential hospice vs ALF with hospice    History of bipolar disorder/schizoaffective disorder Continue with Lamictal, Zyprexa and Ativan Was evaluated by psychiatric, they have signed off   Normocytic anemia - Anemia panel unremarkable for any deficiency   Urine retention -Indwelling Foley placed in setting of pursuing hospice/comfort care   Left  renal cyst: Noted on renal ultrasound   Leukocytosis; urine culture insignificant growth.     Old records reviewed in assessment of this patient  Antimicrobials:   DVT prophylaxis: SCD  Code Status:   Code Status: DNR  Disposition Plan:  Possible residential hospice vs ALF with hospice in place Status is: Inpt  Objective: Blood pressure (!) 88/62, pulse 98, temperature 97.7 F (36.5 C), temperature source Oral, resp. rate 17, height '5\' 5"'  (1.651 m), weight 55.5 kg, SpO2  97 %.  Examination:  Physical Exam Constitutional:      Comments: Awake alert and following commands but appears chronically ill  HENT:     Head: Normocephalic and atraumatic.     Mouth/Throat:     Mouth: Mucous membranes are moist.  Eyes:      Extraocular Movements: Extraocular movements intact.  Cardiovascular:     Rate and Rhythm: Normal rate and regular rhythm.  Pulmonary:     Effort: Pulmonary effort is normal.     Breath sounds: Normal breath sounds.  Abdominal:     General: Bowel sounds are normal. There is no distension.     Palpations: Abdomen is soft.     Tenderness: There is no abdominal tenderness.  Musculoskeletal:        General: Normal range of motion.     Cervical back: Normal range of motion and neck supple.  Skin:    General: Skin is warm and dry.  Neurological:     Mental Status: Mental status is at baseline.  Psychiatric:        Behavior: Behavior normal.     Consultants:  Palliative care Oncology  Procedures:    Data Reviewed: I have personally reviewed labs and imaging studies    LOS: 18 days  Time spent: 57 min  Dwyane Dee, MD [facility] 06/02/2021, 12:52 PM

## 2021-06-02 NOTE — Plan of Care (Signed)
°  Problem: Health Behavior/Discharge Planning: Goal: Ability to manage health-related needs will improve Outcome: Progressing   Problem: Clinical Measurements: Goal: Ability to maintain clinical measurements within normal limits will improve Outcome: Progressing Goal: Will remain free from infection Outcome: Progressing   Problem: Activity: Goal: Risk for activity intolerance will decrease Outcome: Progressing   Problem: Nutrition: Goal: Adequate nutrition will be maintained Outcome: Progressing   Problem: Coping: Goal: Level of anxiety will decrease Outcome: Progressing   Problem: Elimination: Goal: Will not experience complications related to bowel motility Outcome: Progressing Goal: Will not experience complications related to urinary retention Outcome: Progressing   Problem: Safety: Goal: Ability to remain free from injury will improve Outcome: Progressing   Problem: Skin Integrity: Goal: Risk for impaired skin integrity will decrease Outcome: Progressing

## 2021-06-02 NOTE — TOC Progression Note (Signed)
Transition of Care [facility]) - Progression Note    Patient Details  Name: GWYNEVERE LIZANA MRN: [id number] Date of Birth: 06-Feb-1961  Transition of Care [facility]) CM/SW [place], [place] Phone Number: 06/02/2021, 11:08 AM  Clinical Narrative:     CSW followed up with several SNF facilities to discuss pt being placed. Pt does not have LTC insurance and it is unlikely that pt can be placed with regular insurance as she has no skillable need, and is on comfort measures. CSW updated primary CSW who is currently working with hospice and ALF to determine disposition. TOC will continue to follow.  Expected Discharge Plan: [place] Barriers to Discharge: Continued Medical Work up, Ship broker, SNF Pending bed offer  Expected Discharge Plan and Services Expected Discharge Plan: [place] In-house Referral: Clinical Social Work   Post Acute Care Choice: [place] Living arrangements for the past 2 months: [place] Expected Discharge Date: 05/23/21                                     Social Determinants of Health (SDOH) Interventions    Readmission Risk Interventions No flowsheet data found.

## 2021-06-02 NOTE — Progress Notes (Addendum)
[id number]   [facility]) Hospital Liaison Note  Referral received for interest in [facility]. Chart being reviewed by [facility] physician for eligibility.   Unfortunately, she is not appropriate for [facility] at this time. If she declines, please reach out to [place].   Please call with questions or concerns. Thank you  Roselee Nova, [place] Hospital Liaison [phone]

## 2021-06-02 NOTE — Progress Notes (Signed)
Patient ID: [name], female   DOB: [birth year], 61 y.o.   MRN: [id number]    Progress Note from the Palliative Medicine Team at [facility]   Patient Name: Megan Miranda        Date: 06/02/2021 DOB: 11/13/1960  Age: 61 y.o. MRN#: [id number] Attending Physician: Dwyane Dee, MD Primary Care Physician: Patient, No Pcp Per (Inactive) Admit Date: 05/14/2021   Medical records reviewed   61 y.o. female  admitted on 05/14/2021 with  medical history significant for bipolar 1 disorder, schizoaffective disorder, CKD stage IIIb who presented to the ED for evaluation of generalized weakness/unstable gait.  Patient  previously was  living at an assisted living facility/ [facility].  Patient's brother is her legal guardian,  patient does not have capacity for medical decisions.   Patient found to have acute kidney injury/creatinine 2.68 on admission and incidentally found of multiple liver lesions on renal ultrasound, biopsy declined.   Patient continued to decline and objected to all medical interventions.  Decision  made by legal guardian to shift to full comfort focusing on comfort and dignity.  This NP visited patient at the bedside as a follow up for palliative medicine needs and emotional support.  Patient has continues with weakness, poor oral intake; she seems to be tolerating liquids somewhat better but she continues to fail to thrive.     Spoke to brother/legal [employer], by telephone today, he is on his way home from the [facility] and will be back in town tomorrow.       He is comfortable with decision for comfort at this time in patient's life.  Patient is in a difficult transition of care space;  she is not meeting residential  hospice eligibly and we are checking to see if [facility] can provide services for her with hospice in place.    Will continue to monitor patient and when she declares herself eligible for residential hospice we will make that  transition.  Education offered on natural trajectory and expectations at end-of-life.   As reflected on  completed MOST form there is no desire for life prolonging measures,  focus is to allow for a natural death. -DNR/DNI -No artificial feeding now or in the future -No antibiotic use unless for comfort -No IV fluids -No further diagnostics or life prolonging measures -Continue to evaluate for appropriate transition of care.  Education offered on hospice benefit philosophy and eligibility.   Patient will be unable to return to her assisted living, education offered on the difference between skilled nursing facility for custodial care versus residential hospice for end-of-life care.  Questions and concerns addressed   Discussed with Dr Sabino Gasser and Lind Covert LCSW   PMT will continue to support holistically  Wadie Lessen NP  Palliative Medicine Team Team Phone # [phone] Pager [phone]

## 2021-06-02 NOTE — TOC Progression Note (Addendum)
Transition of Care [facility]) - Initial/Assessment Note    Patient Details  Name: Megan Miranda MRN: [id number] Date of Birth: 1960-12-18  Transition of Care [facility]) CM/SW Contact:    Milinda Antis, [place] Phone Number: 06/02/2021, 11:17 AM  Clinical Narrative:                 CSW was contacted [place] with [facility] and informed that the patient would need a higher level of care and that the family has already given a move out notice.  CSW inquired about whether the patient can return with 24 hour nursing assistance?  [name] will speak with the assistant director and inform CW of the outcome.    CSW messaged the attending and palliative NP inquiring as to whether the patient now qualifies for residential hospice.   11:30-  CSW contacted the patient's brother.  He reports that he will be back in town on Wednesday and would like for [facility] to be the facility when the patient is medically ready for residential hospice.  CSW contacted [facility] and put in a referral.  11:50-  CSW received a message from MD that patient is still inconsistently and "too labile"  at this time.   Pending contact from [facility] in reference to whether the patient can return with 24/hour nursing care. CSW will ask if family can provide if the facility agrees.    13:50-  Hospice denied patient.  MD and palliative NP made aware.  CSW was informed that [name] with [facility] is still waiting on an answer in reference to possible 24/7 care at the ALF.    Expected Discharge Plan: Skilled Nursing Facility Barriers to Discharge: Continued Medical Work up, Ship broker, SNF Pending bed offer   Patient Goals and CMS Choice Patient states their goals for this hospitalization and ongoing recovery are:: Rehab CMS Medicare.gov Compare Post Acute Care list provided to:: Patient Represenative (must comment) Choice offered to / list presented to : [facility] POA / Guardian, Sibling (Brother)  Expected  Discharge Plan and Services Expected Discharge Plan: [place] In-house Referral: Clinical Social Work   Post Acute Care Choice: [place] Living arrangements for the past 2 months: [place] Expected Discharge Date: 05/23/21                                    Prior Living Arrangements/Services Living arrangements for the past 2 months: [place] Lives with:: Facility Resident Patient language and need for interpreter reviewed:: Yes Do you feel safe going back to the place where you live?: Yes      Need for Family Participation in Patient Care: Yes (Comment) Care giver support system in place?: Yes (comment)   Criminal Activity/Legal Involvement Pertinent to Current Situation/Hospitalization: No - Comment as needed  Activities of Daily Living Home Assistive Devices/Equipment: Bedside commode/3-in-1 ADL Screening (condition at time of admission) Patient's cognitive ability adequate to safely complete daily activities?: No Is the patient deaf or have difficulty hearing?: No Does the patient have difficulty seeing, even when wearing glasses/contacts?: No Does the patient have difficulty concentrating, remembering, or making decisions?: No Patient able to express need for assistance with ADLs?: Yes Does the patient have difficulty dressing or bathing?: No Independently performs ADLs?: Yes (appropriate for developmental age) Does the patient have difficulty walking or climbing stairs?: No Weakness of Legs: None Weakness of Arms/Hands:  None  Permission Sought/Granted Permission sought to share information with : Facility Sport and exercise psychologist, Family Supports Permission granted to share information with : Yes, Verbal Permission Granted  Share Information with NAME: Megan Miranda  Permission granted to share info w AGENCY: [place] granted to share info w Relationship: Brother/Legal Guardian  Permission  granted to share info w Contact Information: [phone]  Emotional Assessment Appearance:: Appears stated age     Orientation: : Oriented to Self, Oriented to Place, Oriented to  Time, Oriented to Situation Alcohol / Substance Use: Not Applicable Psych Involvement: No (comment)  Admission diagnosis:  Unsteady gait [R26.81] AKI (acute kidney injury) ([place]) [N17.9] Acute kidney injury superimposed on chronic kidney disease ([place]) [N17.9, N18.9] Patient Active Problem List   Diagnosis Date Noted   Goals of care, counseling/discussion 06/01/2021   Acute urinary retention 06/01/2021   Delirium due to another medical condition 05/16/2021   Generalized weakness 05/15/2021   AKI (acute kidney injury) ([place]) 05/15/2021   Acute renal failure superimposed on stage 3b chronic kidney disease ([place]) 05/14/2021   Schizoaffective disorder, manic type ([place]) 12/03/2018   SIRS (systemic inflammatory response syndrome) ([place]) 10/16/2017   Lactic acidosis 10/16/2017   Abnormal TSH 10/16/2017   History of alcohol abuse 10/16/2017   Lithium toxicity 10/15/2017   Bipolar I disorder ([place]) 07/21/2007   PCP:  Patient, No Pcp Per (Inactive) Pharmacy:   [facility] DRUG STORE [place], [place] AT [address] [place] [address] Phone: [phone] Fax: [phone]     Social Determinants of Health (SDOH) Interventions    Readmission Risk Interventions No flowsheet data found.

## 2021-06-03 DIAGNOSIS — F05 Delirium due to known physiological condition: Secondary | ICD-10-CM | POA: Diagnosis not present

## 2021-06-03 NOTE — Progress Notes (Signed)
PROGRESS NOTE    Megan Miranda  SAY:[id number] DOB: 1960-12-28 DOA: 05/14/2021 PCP: Patient, No Pcp Per (Inactive)   Chief Complaint  Patient presents with   Weakness   Brief Narrative/Hospital Course: Megan Miranda, 61 y.o. female with PMH of  bipolar 1 disorder, schizoaffective disorder, CKD3b who presented to the ER with generalized weakness.  Patient  was found to have AKI.  She had worsening mentation underwent MRI brain negative for acute stroke or other acute abnormalities and showed mildly advanced cerebral atrophy.  Further work-up revealed liver lesions with concern for metastatic disease.  Patient's brother is her legal guardian due to being deemed incompetent and 2020.  Palliative care was consulted during hospitalization and had goals of care and ultimately patient was transitioned to comfort measures.     Subjective: Seen and examined this morning.  She is alert awake able to tell me her name she says she is in some sort of facility, not able to have other meaningful conversation  Assessment & Plan:  Goals of care: After family discussion with palliative care transition to comfort measures.  Unable to return to ALF as he needs higher level of care TOC involved, [facility] versus SNF with hospice  Delirium due to another medical condition: Appears alert awake communicative  Liver lesion concerning for metastatic process, abnormal liver function test MRI revealed multiple lesions in the liver concerning for metastatic process. -CEA elevated at 21.  CA 19-9 alpha-fetoprotein normal -CT abdomen and pelvis did not show primary. Uterine fibroid.  -Dr. Pricilla Miranda discussed with patient's brother, apparently patient has been adamantly refusing biopsy.  Plan is to hold on liver biopsy for now.  -Brother is legal guardian.  -referred out patient hematology oncology clinic.  Hypernatremia due to poor oral intake  Bipolar I disorder /Schizoaffective disorder-on Lamictal and Zyprexa  Ativan seen by psych  AKI CKD IIIb: Last creatinine 2.7, no mre blood work Recent [employer] 05/29/21 0734  BUN 51*  CREATININE 2.73*    Normocytic anemia -last hemoglobin 9.9 g Urine retention: Now with Foley catheter in place-keep in place for comfort Left renal cyst Leukocytosis at 15k Constipation-supportive care laxatives Generalized weakness/unsteady gait/debility MRI negative B12, folate and TSH normal.  Skilled nursing facility upon disposition  DVT prophylaxis: none for comfort Code Status:   Code Status: DNR Family Communication: plan of care discussed with patient's Rn Status is: Inpatient Remains inpatient appropriate because: Unsafe disposition Disposition: Currently is medically stable for discharge. Anticipated Disposition: TBD SNF with hospice versus hospice  Total time spent in the care of this patient 25 MINUTES Objective: Vitals last 24 hrs: Vitals:   06/02/21 0900 06/02/21 1659 06/03/21 0631 06/03/21 0920  BP: (!) 88/62 110/67 107/80 109/71  Pulse: 98 (!) 109 (!) 108 (!) 105  Resp: 17 16 17 17   Temp: 97.7 F (36.5 C) 98.2 F (36.8 C) 98.5 F (36.9 C) 98.4 F (36.9 C)  TempSrc: Oral Oral Oral Oral  SpO2: 97% 100% 98% 98%  Weight:      Height:       Weight change:   Intake/Output Summary (Last 24 hours) at 06/03/2021 1050 Last data filed at 06/03/2021 0900 Gross per 24 hour  Intake 1540 ml  Output 1800 ml  Net -260 ml   Net IO Since Admission: -4,332.13 mL [06/03/21 1050]   Physical Examination: General exam: Aa0x1-2, weak,older than stated age. HEENT:Oral mucosa moist, Ear/Nose WNL grossly,dentition normal. Respiratory system: B/l clear BS, no use of accessory  muscle, non tender. Cardiovascular system: S1 & S2 +,No JVD. Gastrointestinal system: Abdomen soft, NT,ND, BS+. Nervous System:Alert, awake, moving extremities. Extremities: edema none, distal peripheral pulses palpable.  Skin: No rashes, no icterus. MSK: Small muscle bulk, tone,  power. Foley catheter in place  Medications reviewed:  Scheduled Meds:  lamoTRIgine  200 mg Oral Daily   LORazepam  1 mg Oral BID   nicotine  14 mg Transdermal Daily   OLANZapine  10 mg Oral BID   senna  1 tablet Oral Daily   tamsulosin  0.4 mg Oral Daily   Continuous Infusions:  Diet Order             Diet - low sodium heart healthy           Diet regular Room service appropriate? Yes; Fluid consistency: Thin  Diet effective now                          Weight change:   Wt Readings from Last 3 Encounters:  05/23/21 55.5 kg  04/14/18 55.8 kg  10/16/17 72.6 kg     Consultants:see note  Procedures:see note Antimicrobials: Anti-infectives (From admission, onward)    Start     Dose/Rate Route Frequency Ordered Stop   05/26/21 1615  cefTRIAXone (ROCEPHIN) 1 g in sodium chloride 0.9 % 100 mL IVPB  Status:  Discontinued        1 g 200 mL/hr over 30 Minutes Intravenous Every 24 hours 05/26/21 1526 05/27/21 1601      Culture/Microbiology    Component Value Date/Time   SDES URINE, CLEAN CATCH 05/26/2021 1151   SPECREQUEST NONE 05/26/2021 1151   CULT (A) 05/26/2021 1151    <10,000 COLONIES/mL INSIGNIFICANT GROWTH Performed at [facility] [address] [place], [place] [address]    REPTSTATUS 05/27/2021 FINAL 05/26/2021 1151    Other culture-see note  Unresulted Labs (From admission, onward)    None     Data Reviewed: I have personally reviewed following labs and imaging studies CBC: No results for input(s): WBC, NEUTROABS, HGB, HCT, MCV, PLT in the last 168 hours. Basic Metabolic Panel: Recent Labs  Lab 05/29/21 0734  NA 135  K 3.6  CL 100  CO2 21*  GLUCOSE 163*  BUN 51*  CREATININE 2.73*  CALCIUM 9.2   GFR: Estimated Creatinine Clearance: 19 mL/min (A) (by C-G formula based on SCr of 2.73 mg/dL (H)). Liver Function Tests: No results for input(s): AST, ALT, ALKPHOS, BILITOT, PROT, ALBUMIN in the last 168 hours. No results for  input(s): LIPASE, AMYLASE in the last 168 hours. No results for input(s): AMMONIA in the last 168 hours. Coagulation Profile: No results for input(s): INR, PROTIME in the last 168 hours. Cardiac Enzymes: No results for input(s): CKTOTAL, CKMB, CKMBINDEX, TROPONINI in the last 168 hours. BNP (last 3 results) No results for input(s): PROBNP in the last 8760 hours. HbA1C: No results for input(s): HGBA1C in the last 72 hours. CBG: No results for input(s): GLUCAP in the last 168 hours. Lipid Profile: No results for input(s): CHOL, HDL, LDLCALC, TRIG, CHOLHDL, LDLDIRECT in the last 72 hours. Thyroid Function Tests: No results for input(s): TSH, T4TOTAL, FREET4, T3FREE, THYROIDAB in the last 72 hours. Anemia Panel: No results for input(s): VITAMINB12, FOLATE, FERRITIN, TIBC, IRON, RETICCTPCT in the last 72 hours. Sepsis Labs: No results for input(s): PROCALCITON, LATICACIDVEN in the last 168 hours.  Recent Results (from the past 240 hour(s))  Urine  Culture     Status: Abnormal   Collection Time: 05/26/21 11:51 AM   Specimen: Urine, Clean Catch  Result Value Ref Range Status   Specimen Description URINE, CLEAN CATCH  Final   Special Requests NONE  Final   Culture (A)  Final    <10,000 COLONIES/mL INSIGNIFICANT GROWTH Performed at [facility], [address], [place], [place] [address]    Report Status 05/27/2021 FINAL  Final     Radiology Studies: No results found.   LOS: 19 days   Antonieta Pert, MD [facility]  06/03/2021, 10:50 AM

## 2021-06-03 NOTE — TOC Progression Note (Addendum)
Transition of Care [facility]) - Initial/Assessment Note    Patient Details  Name: Megan Miranda MRN: [id number] Date of Birth: November 30, 1960  [facility]) CM/SW Contact:    Milinda Antis, LCSWA Phone Number: 06/03/2021, 10:57 AM  Clinical Narrative:                 10:55-  CSW contacted [name] with [facility] to inquire about the patient's ability to return to the ALF with Hospice and possibly a sitter.  There was no answer.  CSW left a secure VM requesting a returned call.    14:30-  CSW notified that [facility] is unable to accept the patient back in the facility.   Expected Discharge Plan: Skilled Nursing Facility Barriers to Discharge: Continued Medical Work up, Ship broker, SNF Pending bed offer   Patient Goals and CMS Choice Patient states their goals for this hospitalization and ongoing recovery are:: Rehab CMS Medicare.gov Compare Post Acute Care list provided to:: Patient Represenative (must comment) Choice offered to / list presented to : [facility] POA / Guardian, Sibling (Brother)  Expected Discharge Plan and Services Expected Discharge Plan: [place] In-house Referral: Clinical Social Work   Post Acute Care Choice: [place] Living arrangements for the past 2 months: [place] Expected Discharge Date: 05/23/21                                    Prior Living Arrangements/Services Living arrangements for the past 2 months: [place] Lives with:: Facility Resident Patient language and need for interpreter reviewed:: Yes Do you feel safe going back to the place where you live?: Yes      Need for Family Participation in Patient Care: Yes (Comment) Care giver support system in place?: Yes (comment)   Criminal Activity/Legal Involvement Pertinent to Current Situation/Hospitalization: No - Comment as needed  Activities of Daily Living Home Assistive Devices/Equipment: Bedside  commode/3-in-1 ADL Screening (condition at time of admission) Patient's cognitive ability adequate to safely complete daily activities?: No Is the patient deaf or have difficulty hearing?: No Does the patient have difficulty seeing, even when wearing glasses/contacts?: No Does the patient have difficulty concentrating, remembering, or making decisions?: No Patient able to express need for assistance with ADLs?: Yes Does the patient have difficulty dressing or bathing?: No Independently performs ADLs?: Yes (appropriate for developmental age) Does the patient have difficulty walking or climbing stairs?: No Weakness of Legs: None Weakness of Arms/Hands: None  Permission Sought/Granted Permission sought to share information with : Facility Sport and exercise psychologist, Family Supports Permission granted to share information with : Yes, Verbal Permission Granted  Share Information with NAME: [name]  Permission granted to share info w AGENCY: [facility]  Permission granted to share info w Relationship: Brother/Legal Guardian  Permission granted to share info w Contact Information: [phone]  Emotional Assessment Appearance:: Appears stated age     Orientation: : Oriented to Self, Oriented to Place, Oriented to  Time, Oriented to Situation Alcohol / Substance Use: Not Applicable Psych Involvement: No (comment)  Admission diagnosis:  Unsteady gait [R26.81] AKI (acute kidney injury) ([place]) [N17.9] Acute kidney injury superimposed on chronic kidney disease ([place]) [N17.9, N18.9] Patient Active Problem List   Diagnosis Date Noted   Goals of care, counseling/discussion 06/01/2021   Acute urinary retention 06/01/2021   Delirium due to another medical condition 05/16/2021   Generalized weakness 05/15/2021  AKI (acute kidney injury) ([place]) 05/15/2021   Acute renal failure superimposed on stage 3b chronic kidney disease ([place]) 05/14/2021   Schizoaffective disorder, manic type ([place]) 12/03/2018    SIRS (systemic inflammatory response syndrome) ([place]) 10/16/2017   Lactic acidosis 10/16/2017   Abnormal TSH 10/16/2017   History of alcohol abuse 10/16/2017   Lithium toxicity 10/15/2017   Bipolar I disorder ([place]) 07/21/2007   PCP:  Patient, No Pcp Per (Inactive) Pharmacy:   [facility] DR[facility] STORE [place], [place] [address] Phone: [phone] Fax: [phone]     Social Determinants of Health (SDOH) Interventions    Readmission Risk Interventions No flowsheet data found.

## 2021-06-04 DIAGNOSIS — F05 Delirium due to known physiological condition: Secondary | ICD-10-CM | POA: Diagnosis not present

## 2021-06-04 NOTE — Progress Notes (Signed)
Physical Therapy Treatment Patient Details Name: Megan Miranda MRN: [id number] DOB: 1960-12-22 Today's Date: 06/04/2021   History of Present Illness Megan Miranda is a 61 y.o. female who presented 05/14/2021 to the ED for evaluation of generalized weakness/unstable gait and falls where legs gave out. Found to have acute kidney injury superimposed on chronic kidney disease, MRI brain negative for acute event. MRI revealed  multiple lesions in the liver concerning for metastatic process.Significant medical history for bipolar 1 disorder, schizoaffective disorder, CKD stage IIIb    PT Comments    Pt sleeping upon PT arrival to room, wakes and agreeable to mobility. Pt overall requiring mod-max assist for moving to/from EOB, pt tolerating EOB sitting for ~5 minutes with frequent PT assist to correct preference for R lateral leaning. Pt follows all commands today, is very pleasant and tolerates LE HEP well. PT to continue to follow acutely, SNF for rehab remains appropriate disposition.     Recommendations for follow up therapy are one component of a multi-disciplinary discharge planning process, led by the attending physician.  Recommendations may be updated based on patient status, additional functional criteria and insurance authorization.  Follow Up Recommendations  Skilled nursing-short term rehab (<3 hours/day)     Assistance Recommended at Discharge Frequent or constant Supervision/Assistance  Patient can return home with the following A lot of help with walking and/or transfers;Assistance with cooking/housework;A lot of help with bathing/dressing/bathroom   Equipment Recommendations  Other (comment) (defer to next venue)    Recommendations for Other Services       Precautions / Restrictions Precautions Precautions: Fall Restrictions Weight Bearing Restrictions: No     Mobility  Bed Mobility Overal bed mobility: Needs Assistance Bed Mobility: Supine to Sit;Sit to Supine      Supine to sit: Max assist Sit to supine: Mod assist   General bed mobility comments: max assist for supine>sit for LE progression to EOB, trunk elevation but pt assists when cued. Mod assist for sit>supine for trunk lower, completing LEs into bed.    Transfers                   General transfer comment: NT - pt with difficulty gaining seated balance    Ambulation/Gait                   Stairs             Wheelchair Mobility    Modified Rankin (Stroke Patients Only)       Balance Overall balance assessment: Needs assistance Sitting-balance support: Feet supported Sitting balance-Leahy Scale: Fair Sitting balance - Comments: heavy R lateral leaning, corrects with mod truncal PT assist. Pt with periods of min guard sitting when using LUE to support self on bedrail Postural control: Right lateral lean                                  Cognition Arousal/Alertness: Awake/alert Behavior During Therapy: Restless;Impulsive Overall Cognitive Status: No family/caregiver present to determine baseline cognitive functioning Area of Impairment: Orientation;Memory;Attention;Safety/judgement;Following commands;Awareness;Problem solving                 Orientation Level: Disoriented to;Time;Situation;Place Current Attention Level: Focused Memory: Decreased short-term memory Following Commands: Follows one step commands with increased time   Awareness: Intellectual Problem Solving: Slow processing;Decreased initiation;Difficulty sequencing;Requires verbal cues;Requires tactile cues General Comments: Pt following one-step commands well, especially when combined with tactile cuing.  Pt difficult to understand today, vocalizations often incoherent. Does ask for water x2, and states "yes" she wants to get up.        Exercises General Exercises - Lower Extremity Heel Slides: AAROM;Both;5 reps;Supine Hip ABduction/ADduction: AAROM;Both;Supine;5  reps Toe Raises: AROM;Both;10 reps;Seated    General Comments        Pertinent Vitals/Pain Pain Assessment: No/denies pain Faces Pain Scale: No hurt Pain Intervention(s): Monitored during session    Home Living                          Prior Function            PT Goals (current goals can now be found in the care plan section) Acute Rehab PT Goals Patient Stated Goal: water PT Goal Formulation: With patient Time For Goal Achievement: 06/10/21 Potential to Achieve Goals: Fair Progress towards PT goals: Progressing toward goals    Frequency    Min 2X/week      PT Plan Current plan remains appropriate    Co-evaluation              AM-PAC PT "6 Clicks" Mobility   Outcome Measure  Help needed turning from your back to your side while in a flat bed without using bedrails?: A Lot Help needed moving from lying on your back to sitting on the side of a flat bed without using bedrails?: A Lot Help needed moving to and from a bed to a chair (including a wheelchair)?: A Lot Help needed standing up from a chair using your arms (e.g., wheelchair or bedside chair)?: A Lot Help needed to walk in hospital room?: Total Help needed climbing 3-5 steps with a railing? : Total 6 Click Score: 10    End of Session   Activity Tolerance: Patient limited by fatigue Patient left: with call bell/phone within reach;in bed;with bed alarm set Nurse Communication: Mobility status PT Visit Diagnosis: Other abnormalities of gait and mobility (R26.89);History of falling (Z91.81);Muscle weakness (generalized) (M62.81);Other symptoms and signs involving the nervous system (R29.898)     Time: 6203-5597 PT Time Calculation (min) (ACUTE ONLY): 15 min  Charges:  $Therapeutic Activity: 8-22 mins                     Stacie Glaze, PT DPT Acute Rehabilitation Services Pager [phone]  Office [phone]    Roxine Caddy E Ruffin Pyo 06/04/2021, 3:16 PM

## 2021-06-04 NOTE — Progress Notes (Signed)
Occupational Therapy Treatment Patient Details Name: Megan Miranda MRN: [id number] DOB: Apr 26, 1961 Today's Date: 06/04/2021   History of present illness Megan Miranda is a 61 y.o. female who presented 05/14/2021 to the ED for evaluation of generalized weakness/unstable gait and falls where legs gave out. Found to have acute kidney injury superimposed on chronic kidney disease, MRI brain negative for acute event. MRI revealed  multiple lesions in the liver concerning for metastatic process.Significant medical history for bipolar 1 disorder, schizoaffective disorder, CKD stage IIIb   OT comments  [name] is progressing incrementally towards her acute OT goals. She was perseverating on needing a smoothie this session and difficult to re-direct, otherwise her verbiage was mainly incoherent. Overall she required mod-max for bed mobility with physical assist and multimodal cues required. Once sitting EOB pt had extreme R lateral lean that improved with bimanual midline task (opening straw wrappers & locating items on tray table), but overall required mod A to correct. Attempted sit<>stand with max A, pt resisting to put herself back into bed. D/c recommendation remain appropriate, OT to continue to follow acutely    Recommendations for follow up therapy are one component of a multi-disciplinary discharge planning process, led by the attending physician.  Recommendations may be updated based on patient status, additional functional criteria and insurance authorization.    Follow Up Recommendations  Skilled nursing-short term rehab (<3 hours/day)    Assistance Recommended at Discharge Frequent or constant Supervision/Assistance  Patient can return home with the following  Two people to help with walking and/or transfers;Two people to help with bathing/dressing/bathroom   Equipment Recommendations  Other (comment)       Precautions / Restrictions Precautions Precautions: Fall Precaution Comments:  impulsive Restrictions Weight Bearing Restrictions: No       Mobility Bed Mobility Overal bed mobility: Needs Assistance Bed Mobility: Supine to Sit;Sit to Supine     Supine to sit: Max assist Sit to supine: Mod assist   General bed mobility comments: max A to get to EOB as pt was resisting by pushing against rail    Transfers Overall transfer level: Needs assistance     Sit to Stand: Max assist           General transfer comment: max A for brief sit<>stand with minimal hip clearance.     Balance Overall balance assessment: Needs assistance Sitting-balance support: Feet supported Sitting balance-Leahy Scale: Fair Sitting balance - Comments: heavy R lateral leaning.assist. periods of min G-mod A for midline balance, pt has better posture when focusing on bimanual midline task Postural control: Right lateral lean                                 ADL either performed or assessed with clinical judgement   ADL Overall ADL's : Needs assistance/impaired Eating/Feeding: Set up;Sitting Eating/Feeding Details (indicate cue type and reason): seated EOB Grooming: Brushing hair;Minimal assistance;Sitting Grooming Details (indicate cue type and reason): min A for verbal cues and to initiate the task                   Toilet Transfer Details (indicate cue type and reason): attempted transfer to a chair this session, pt resisting and attempting to put herself back into the bed         Functional mobility during ADLs: Moderate assistance;+2 for physical assistance;+2 for safety/equipment General ADL Comments: limited to EOB this session with brief sit<>stand. pt  resisting most movement and attempts for OOB. groomed while sitting EOB    Extremity/Trunk Assessment Upper Extremity Assessment Upper Extremity Assessment: Difficult to assess due to impaired cognition   Lower Extremity Assessment Lower Extremity Assessment: Defer to PT evaluation         Vision   Additional Comments: difficult to asssess, pt locating items on her tray that are directly in front of her, required multimodial cues to scan L&R          Cognition Arousal/Alertness: Awake/alert Behavior During Therapy: Restless;Impulsive Overall Cognitive Status: No family/caregiver present to determine baseline cognitive functioning Area of Impairment: Orientation;Memory;Attention;Safety/judgement;Following commands;Awareness;Problem solving                 Orientation Level: Disoriented to;Time;Situation;Place Current Attention Level: Focused Memory: Decreased short-term memory Following Commands: Follows one step commands with increased time Safety/Judgement: Decreased awareness of deficits;Decreased awareness of safety Awareness: Intellectual Problem Solving: Slow processing;Decreased initiation;Difficulty sequencing;Requires verbal cues;Requires tactile cues General Comments: pt follows one step commands with incrased time and multimodial cues. verbalizing need of water suction? and a smoothie throughout session, other verbalizations are incoherent.          Exercises General Exercises - Lower Extremity Heel Slides: AAROM;Both;5 reps;Supine Hip ABduction/ADduction: AAROM;Both;Supine;5 reps Toe Raises: AROM;Both;10 reps;Seated   Shoulder Instructions       General Comments VSS on RA    Pertinent Vitals/ Pain       Pain Assessment: Faces Faces Pain Scale: No hurt Pain Intervention(s): Monitored during session   Frequency  Min 2X/week        Progress Toward Goals  OT Goals(current goals can now be found in the care plan section)  Progress towards OT goals: Progressing toward goals  Acute Rehab OT Goals Patient Stated Goal: to get a smoothie OT Goal Formulation: With patient Time For Goal Achievement: 05/29/21 Potential to Achieve Goals: Good ADL Goals Pt Will Perform Grooming: with supervision;standing;with set-up Pt Will Perform  Upper Body Bathing: with set-up;with supervision;sitting;standing Pt Will Perform Lower Body Bathing: with set-up;with supervision;sit to/from stand Pt Will Perform Upper Body Dressing: with supervision;with set-up;sitting Pt Will Perform Lower Body Dressing: with set-up;with supervision;sit to/from stand Pt Will Transfer to Toilet: with supervision;ambulating;bedside commode Pt Will Perform Toileting - Clothing Manipulation and hygiene: with supervision;sit to/from stand Additional ADL Goal #1: Pt will be Independent in and OOB for basic ADLs  Plan Discharge plan remains appropriate       AM-PAC OT "6 Clicks" Daily Activity     Outcome Measure   Help from another person eating meals?: None Help from another person taking care of personal grooming?: A Little Help from another person toileting, which includes using toliet, bedpan, or urinal?: A Lot Help from another person bathing (including washing, rinsing, drying)?: A Lot Help from another person to put on and taking off regular upper body clothing?: A Lot Help from another person to put on and taking off regular lower body clothing?: Total 6 Click Score: 14    End of Session Equipment Utilized During Treatment: Gait belt  OT Visit Diagnosis: Unsteadiness on feet (R26.81);Other abnormalities of gait and mobility (R26.89);Muscle weakness (generalized) (M62.81);Pain;Other symptoms and signs involving cognitive function Pain - Right/Left: Right Pain - part of body: Leg   Activity Tolerance Patient tolerated treatment well   Patient Left with call bell/phone within reach;in chair;with chair alarm set   Nurse Communication Mobility status        Time: 4742-5956 OT Time Calculation (min): 23 min  Charges: OT General Charges $OT Visit: 1 Visit OT Treatments $Therapeutic Activity: 23-37 mins   Lizandro Spellman A Larin Weissberg 06/04/2021, 4:57 PM

## 2021-06-04 NOTE — Progress Notes (Signed)
PROGRESS NOTE    TAMANA HATFIELD  LTJ:[id number] DOB: 1961-05-09 DOA: 05/14/2021 PCP: Patient, No Pcp Per (Inactive)   Chief Complaint  Patient presents with   Weakness   Brief Narrative/Hospital Course: [name], 61 y.o. female with PMH of  bipolar 1 disorder, schizoaffective disorder, CKD3b who presented to the ER with generalized weakness.  Patient  was found to have AKI.  She had worsening mentation underwent MRI brain negative for acute stroke or other acute abnormalities and showed mildly advanced cerebral atrophy.  Further work-up revealed liver lesions with concern for metastatic disease.  Patient's brother is her legal guardian due to being deemed incompetent and 2020.  Palliative care was consulted during hospitalization and had goals of care and ultimately patient was transitioned to comfort measures.  Patient has been alert awake drinking some but with poor oral intake   Subjective: Overnight no fever, saturating well on room air blood pressure stable in low 100 This morning she is alert awake oriented to self and place, nursing reports she has been eating and drinking.  Assessment & Plan:  Goals of care: After family discussion with palliative care transition to comfort measures.  Unable to return to ALF as he needs higher level of care TOC involved, [facility] SNF with hospice Since patient is eating drinking tolerating remains a stable will obtain PT OT evaluation to see if she can return to facility for SNF.  Delirium due to another medical condition: Appears alert awake communicative  Liver lesion concerning for metastatic process, abnormal liver function test MRI revealed multiple lesions in the liver concerning for metastatic process. -CEA elevated at 21.  CA 19-9 alpha-fetoprotein normal -CT abdomen and pelvis did not show primary. Uterine fibroid.  -Dr. Pricilla Handler discussed with patient's brother, apparently patient has been adamantly refusing biopsy.  Plan  is to hold on liver biopsy for now.  -Brother is legal guardian.   Hypernatremia due to poor oral intake  Bipolar I disorder /Schizoaffective disorder-on Lamictal and Zyprexa Ativan seen by psych  AKI CKD IIIb: Last creatinine 2.7, no mre blood work Recent [employer] 05/29/21 0734  BUN 51*  CREATININE 2.73*    Normocytic anemia -last hemoglobin 9.9 g Urine retention: Now with Foley catheter in place-keep in place for comfort Left renal cyst Leukocytosis at 15k Constipation-supportive care laxatives Generalized weakness/unsteady gait/debility MRI negative B12, folate and TSH normal.  Skilled nursing facility upon disposition  DVT prophylaxis: none for comfort Code Status:   Code Status: DNR Family Communication: plan of care discussed with patient's Rn Status is: Inpatient Remains inpatient appropriate because: Unsafe disposition Disposition: Currently is medically stable for discharge. Anticipated Disposition: PT OT eval today.  Waiting for hospice. Unable to go back to [facility]   Total time spent in the care of this patient 25 MINUTES Objective: Vitals last 24 hrs: Vitals:   06/03/21 0631 06/03/21 0920 06/04/21 0500 06/04/21 0917  BP: 107/80 109/71 108/79 98/65  Pulse: (!) 108 (!) 105 (!) 106 (!) 118  Resp: 17 17 18 18   Temp: 98.5 F (36.9 C) 98.4 F (36.9 C) 98.5 F (36.9 C) 98.3 F (36.8 C)  TempSrc: Oral Oral Oral Oral  SpO2: 98% 98% 98% 96%  Weight:      Height:       Weight change:   Intake/Output Summary (Last 24 hours) at 06/04/2021 0955 Last data filed at 06/04/2021 0600 Gross per 24 hour  Intake 1200 ml  Output 2250 ml  Net -1050 ml   Net IO Since Admission: -5,632.13 mL [06/04/21 0955]   Physical Examination: General exam: AAOx2, thin, frail elderly. HEENT:Oral mucosa moist, Ear/Nose WNL grossly, dentition normal. Respiratory system: bilaterally clear, no use of accessory muscle Cardiovascular system: S1 & S2 +, No JVD,. Gastrointestinal  system: Abdomen soft, NT,ND, BS+ Nervous System:Alert, awake, moving extremities and grossly nonfocal Extremities: no edema, distal peripheral pulses palpable.  Skin: No rashes,no icterus. MSK: Normal muscle bulk,tone, power  Foley catheter in place  Medications reviewed:  Scheduled Meds:  lamoTRIgine  200 mg Oral Daily   LORazepam  1 mg Oral BID   nicotine  14 mg Transdermal Daily   OLANZapine  10 mg Oral BID   senna  1 tablet Oral Daily   tamsulosin  0.4 mg Oral Daily   Continuous Infusions:  Diet Order             Diet - low sodium heart healthy           Diet regular Room service appropriate? Yes; Fluid consistency: Thin  Diet effective now                          Weight change:   Wt Readings from Last 3 Encounters:  05/23/21 55.5 kg  04/14/18 55.8 kg  10/16/17 72.6 kg     Consultants:see note  Procedures:see note Antimicrobials: Anti-infectives (From admission, onward)    Start     Dose/Rate Route Frequency Ordered Stop   05/26/21 1615  cefTRIAXone (ROCEPHIN) 1 g in sodium chloride 0.9 % 100 mL IVPB  Status:  Discontinued        1 g 200 mL/hr over 30 Minutes Intravenous Every 24 hours 05/26/21 1526 05/27/21 1601      Culture/Microbiology    Component Value Date/Time   SDES URINE, CLEAN CATCH 05/26/2021 1151   SPECREQUEST NONE 05/26/2021 1151   CULT (A) 05/26/2021 1151    <10,000 COLONIES/mL INSIGNIFICANT GROWTH Performed at [facility] [address], [place], [place] [address]    REPTSTATUS 05/27/2021 FINAL 05/26/2021 1151    Other culture-see note  Unresulted Labs (From admission, onward)    None     Data Reviewed: I have personally reviewed following labs and imaging studies CBC: No results for input(s): WBC, NEUTROABS, HGB, HCT, MCV, PLT in the last 168 hours. Basic Metabolic Panel: Recent Labs  Lab 05/29/21 0734  NA 135  K 3.6  CL 100  CO2 21*  GLUCOSE 163*  BUN 51*  CREATININE 2.73*  CALCIUM 9.2    GFR: Estimated Creatinine Clearance: 19 mL/min (A) (by C-G formula based on SCr of 2.73 mg/dL (H)). Liver Function Tests: No results for input(s): AST, ALT, ALKPHOS, BILITOT, PROT, ALBUMIN in the last 168 hours. No results for input(s): LIPASE, AMYLASE in the last 168 hours. No results for input(s): AMMONIA in the last 168 hours. Coagulation Profile: No results for input(s): INR, PROTIME in the last 168 hours. Cardiac Enzymes: No results for input(s): CKTOTAL, CKMB, CKMBINDEX, TROPONINI in the last 168 hours. BNP (last 3 results) No results for input(s): PROBNP in the last 8760 hours. HbA1C: No results for input(s): HGBA1C in the last 72 hours. CBG: No results for input(s): GLUCAP in the last 168 hours. Lipid Profile: No results for input(s): CHOL, HDL, LDLCALC, TRIG, CHOLHDL, LDLDIRECT in the last 72 hours. Thyroid Function Tests: No results for input(s): TSH, T4TOTAL, FREET4, T3FREE, THYROIDAB in the last 72 hours.  Anemia Panel: No results for input(s): VITAMINB12, FOLATE, FERRITIN, TIBC, IRON, RETICCTPCT in the last 72 hours. Sepsis Labs: No results for input(s): PROCALCITON, LATICACIDVEN in the last 168 hours.  Recent Results (from the past 240 hour(s))  Urine Culture     Status: Abnormal   Collection Time: 05/26/21 11:51 AM   Specimen: Urine, Clean Catch  Result Value Ref Range Status   Specimen Description URINE, CLEAN CATCH  Final   Special Requests NONE  Final   Culture (A)  Final    <10,000 COLONIES/mL INSIGNIFICANT GROWTH Performed at [facility], [address] [place], [place] [address]    Report Status 05/27/2021 FINAL  Final     Radiology Studies: No results found.   LOS: 20 days   Antonieta Pert, MD [facility]  06/04/2021, 9:55 AM

## 2021-06-04 NOTE — Plan of Care (Signed)
  Problem: Activity: Goal: Risk for activity intolerance will decrease Outcome: Not Progressing   

## 2021-06-04 NOTE — TOC Progression Note (Addendum)
Transition of Care [facility]) - Initial/Assessment Note    Patient Details  Name: Megan Miranda MRN: [id number] Date of Birth: 01-30-1961  [facility]) CM/SW Contact:    Milinda Antis, [place] Phone Number: 06/04/2021, 12:53 PM  Clinical Narrative:                 CSW informed that the patient is doing very well today.  She is eating, drinking, and alert.  CSW asked the MD request that PT and OT work with the patient today.    CSW was notified that [place] has 2 female beds SNF available.  CSW contacted the facility and sent the patient's FL2.  CSW needs PT and OT assessments so that insurance authorization can be submitted.    PT recommendation of SNF relayed to facility.  Patient's brother in agreement.      Expected Discharge Plan: Skilled Nursing Facility Barriers to Discharge: Continued Medical Work up, Ship broker, SNF Pending bed offer   Patient Goals and CMS Choice Patient states their goals for this hospitalization and ongoing recovery are:: Rehab CMS Medicare.gov Compare Post Acute Care list provided to:: Patient Represenative (must comment) Choice offered to / list presented to : [facility] POA / Guardian, Sibling (Brother)  Expected Discharge Plan and Services Expected Discharge Plan: [place] In-house Referral: Clinical Social Work   Post Acute Care Choice: [place] Living arrangements for the past 2 months: [place] Expected Discharge Date: 05/23/21                                    Prior Living Arrangements/Services Living arrangements for the past 2 months: [place] Lives with:: Facility Resident Patient language and need for interpreter reviewed:: Yes Do you feel safe going back to the place where you live?: Yes      Need for Family Participation in Patient Care: Yes (Comment) Care giver support system in place?: Yes (comment)   Criminal Activity/Legal Involvement  Pertinent to Current Situation/Hospitalization: No - Comment as needed  Activities of Daily Living Home Assistive Devices/Equipment: Bedside commode/3-in-1 ADL Screening (condition at time of admission) Patient's cognitive ability adequate to safely complete daily activities?: No Is the patient deaf or have difficulty hearing?: No Does the patient have difficulty seeing, even when wearing glasses/contacts?: No Does the patient have difficulty concentrating, remembering, or making decisions?: No Patient able to express need for assistance with ADLs?: Yes Does the patient have difficulty dressing or bathing?: No Independently performs ADLs?: Yes (appropriate for developmental age) Does the patient have difficulty walking or climbing stairs?: No Weakness of Legs: None Weakness of Arms/Hands: None  Permission Sought/Granted Permission sought to share information with : Facility Sport and exercise psychologist, Family Supports Permission granted to share information with : Yes, Verbal Permission Granted  Share Information with NAME: [name]  Permission granted to share info w AGENCY: [facility]  Permission granted to share info w Relationship: Brother/Legal Guardian  Permission granted to share info w Contact Information: [phone]  Emotional Assessment Appearance:: Appears stated age     Orientation: : Oriented to Self, Oriented to Place, Oriented to  Time, Oriented to Situation Alcohol / Substance Use: Not Applicable Psych Involvement: No (comment)  Admission diagnosis:  Unsteady gait [R26.81] AKI (acute kidney injury) ([place]) [N17.9] Acute kidney injury superimposed on chronic kidney disease ([place]) [N17.9, N18.9] Patient Active Problem List   Diagnosis Date  Noted   Goals of care, counseling/discussion 06/01/2021   Acute urinary retention 06/01/2021   Delirium due to another medical condition 05/16/2021   Generalized weakness 05/15/2021   AKI (acute kidney injury) ([place]) 05/15/2021    Acute renal failure superimposed on stage 3b chronic kidney disease ([place]) 05/14/2021   Schizoaffective disorder, manic type ([place]) 12/03/2018   SIRS (systemic inflammatory response syndrome) ([place]) 10/16/2017   Lactic acidosis 10/16/2017   Abnormal TSH 10/16/2017   History of alcohol abuse 10/16/2017   Lithium toxicity 10/15/2017   Bipolar I disorder ([place]) 07/21/2007   PCP:  Patient, No Pcp Per (Inactive) Pharmacy:   [facility] [place], [address] [place] [address] Phone: [phone] Fax: [phone]     Social Determinants of Health (SDOH) Interventions    Readmission Risk Interventions No flowsheet data found.

## 2021-06-05 DIAGNOSIS — F05 Delirium due to known physiological condition: Secondary | ICD-10-CM | POA: Diagnosis not present

## 2021-06-05 LAB — RESP PANEL BY RT-PCR (FLU A&B, COVID) ARPGX2
Influenza A by PCR: NEGATIVE
Influenza B by PCR: NEGATIVE
SARS Coronavirus 2 by RT PCR: NEGATIVE

## 2021-06-05 NOTE — TOC Progression Note (Signed)
Transition of Care [facility]) - Initial/Assessment Note    Patient Details  Name: Megan Miranda MRN: [id number] Date of Birth: 1961-01-12  [facility]) CM/SW Contact:    Milinda Antis, LCSWA Phone Number: 06/05/2021, 11:45 AM  Clinical Narrative:                 CSW contacted Helene Kelp with admissions at [facility] to inquire about the status of insurance auth and is awaiting a response.    Expected Discharge Plan: Skilled Nursing Facility Barriers to Discharge: Continued Medical Work up, Ship broker, SNF Pending bed offer   Patient Goals and CMS Choice Patient states their goals for this hospitalization and ongoing recovery are:: Rehab CMS Medicare.gov Compare Post Acute Care list provided to:: Patient Represenative (must comment) Choice offered to / list presented to : [facility] POA / Guardian, Sibling (Brother)  Expected Discharge Plan and Services Expected Discharge Plan: [place] In-house Referral: Clinical Social Work   Post Acute Care Choice: [place] Living arrangements for the past 2 months: [place] Expected Discharge Date: 05/23/21                                    Prior Living Arrangements/Services Living arrangements for the past 2 months: [place] Lives with:: Facility Resident Patient language and need for interpreter reviewed:: Yes Do you feel safe going back to the place where you live?: Yes      Need for Family Participation in Patient Care: Yes (Comment) Care giver support system in place?: Yes (comment)   Criminal Activity/Legal Involvement Pertinent to Current Situation/Hospitalization: No - Comment as needed  Activities of Daily Living Home Assistive Devices/Equipment: Bedside commode/3-in-1 ADL Screening (condition at time of admission) Patient's cognitive ability adequate to safely complete daily activities?: No Is the patient deaf or have difficulty hearing?:  No Does the patient have difficulty seeing, even when wearing glasses/contacts?: No Does the patient have difficulty concentrating, remembering, or making decisions?: No Patient able to express need for assistance with ADLs?: Yes Does the patient have difficulty dressing or bathing?: No Independently performs ADLs?: Yes (appropriate for developmental age) Does the patient have difficulty walking or climbing stairs?: No Weakness of Legs: None Weakness of Arms/Hands: None  Permission Sought/Granted Permission sought to share information with : Facility Sport and exercise psychologist, Family Supports Permission granted to share information with : Yes, Verbal Permission Granted  Share Information with NAME: [name]  Permission granted to share info w AGENCY: [facility]  Permission granted to share info w Relationship: Brother/Legal Guardian  Permission granted to share info w Contact Information: [phone]  Emotional Assessment Appearance:: Appears stated age     Orientation: : Oriented to Self, Oriented to Place, Oriented to  Time, Oriented to Situation Alcohol / Substance Use: Not Applicable Psych Involvement: No (comment)  Admission diagnosis:  Unsteady gait [R26.81] AKI (acute kidney injury) ([place]) [N17.9] Acute kidney injury superimposed on chronic kidney disease ([place]) [N17.9, N18.9] Patient Active Problem List   Diagnosis Date Noted   Goals of care, counseling/discussion 06/01/2021   Acute urinary retention 06/01/2021   Delirium due to another medical condition 05/16/2021   Generalized weakness 05/15/2021   AKI (acute kidney injury) ([place]) 05/15/2021   Acute renal failure superimposed on stage 3b chronic kidney disease ([place]) 05/14/2021   Schizoaffective disorder, manic type ([place]) 12/03/2018   SIRS (systemic inflammatory response syndrome) ([place]) 10/16/2017  Lactic acidosis 10/16/2017   Abnormal TSH 10/16/2017   History of alcohol abuse 10/16/2017   Lithium toxicity  10/15/2017   Bipolar I disorder ([place]) 07/21/2007   PCP:  Patient, No Pcp Per (Inactive) Pharmacy:   [facility] [place], [place] [address] Phone: [phone] Fax: [phone]     Social Determinants of Health (SDOH) Interventions    Readmission Risk Interventions No flowsheet data found.

## 2021-06-05 NOTE — Discharge Summary (Addendum)
Physician Discharge Summary  Megan Miranda CWC:[id number] DOB: 11/04/1960 DOA: 05/14/2021  PCP: Patient, No Pcp Per (Inactive)  Admit date: 05/14/2021 Discharge date: 06/06/2021  Admitted From: snf Disposition:  SNF  Recommendations for Outpatient Follow-up:  Follow up with PCP in 1-2 weeks Please obtain BMP/CBC in one week Please follow up on the following pending results:  Home Health:no  Equipment/Devices: none  Discharge Condition: Stable Code Status:   Code Status: DNR Diet recommendation:  Diet Order             Diet - low sodium heart healthy           Diet regular Room service appropriate? Yes; Fluid consistency: Thin  Diet effective now                    Brief/Interim Summary: Megan Miranda, 61 y.o. female with PMH of  bipolar 1 disorder, schizoaffective disorder, CKD3b who presented to the ER with generalized weakness.  Patient  was found to have AKI.  She had worsening mentation underwent MRI brain negative for acute stroke or other acute abnormalities and showed mildly advanced cerebral atrophy.  Further work-up revealed liver lesions with concern for metastatic disease.  Patient's brother is her legal guardian due to being deemed incompetent and 2020.  Palliative care was consulted during hospitalization and had goals of care and ultimately patient was transitioned to comfort measures.  Patient has been alert awake. She was able to work with physical therapy Occupational Therapy 1/11 and recommending skilled nursing facility and short-term.  Discharge Diagnoses:   Goals of care: After family discussion with palliative care transition to comfort measures.  Unable to return to ALF as she needs higher level of care TOC involved.Since patient is eating drinking tolerating remains stable and PTOT evaluation recommended skilled nursing facility.  We will continue w/ SNF disposition BUT if patient declines further as per family wishes can transition to hospice care,  PALLIAITVE INVOLVED  Delirium due to another medical condition: Appears alert awake communicative and interactive fairly oriented x2  Liver lesion concerning for metastatic process, abnormal liver function test MRI revealed multiple lesions in the liver concerning for metastatic process. -CEA elevated at 21.  CA 19-9 alpha-fetoprotein normal -CT abdomen and pelvis did not show primary. Uterine fibroid.  -Dr. Pricilla Miranda discussed with patient's brother, apparently patient has been adamantly refusing biopsy.  Plan is to hold on liver biopsy for now.  Outpatient oncology referral was given. -Brother is legal guardian.   Hypernatremia due to poor oral intake  Bipolar I disorder /Schizoaffective disorder-on Lamictal and Zyprexa Ativan seen by psych  AKI CKD IIIb: Last creatinine 2.7.   Normocytic anemia -last hemoglobin 9.9 g Urine retention: Now with Foley catheter in place-keep in place for comfort Left renal cyst Leukocytosis at 15k Constipation-supportive care laxatives Generalized weakness/unsteady gait/debility MRI negative B12, folate and TSH normal.  Skilled nursing facility upon disposition  Consults: PMT  Subjective: Alert awake oriented to self current place, no complaints  Discharge Exam: Vitals:   06/05/21 0945 06/06/21 0905  BP: (!) 92/54 (!) 100/56  Pulse: 100 99  Resp: 17 17  Temp: 98.2 F (36.8 C) 97.8 F (36.6 C)  SpO2: 98% 98%   General: Pt is alert, awake, not in acute distress Cardiovascular: RRR, S1/S2 +, no rubs, no gallops Respiratory: CTA bilaterally, no wheezing, no rhonchi Abdominal: Soft, NT, ND, bowel sounds + Extremities: no edema, no cyanosis  Discharge Instructions  Discharge Instructions  Ambulatory referral to Hematology / Oncology   Complete by: As directed    Multiple liver lesions.   Diet - low sodium heart healthy   Complete by: As directed    Discharge instructions   Complete by: As directed    Please call call MD or return  to ER for similar or worsening recurring problem that brought you to hospital or if any fever,nausea/vomiting,abdominal pain, uncontrolled pain, chest pain,  shortness of breath or any other alarming symptoms.  Continue rehabilitation to skilled nursing facility patient continues to worsen consider conversion to palliative/hospice  Please follow-up your doctor as instructed in a week time and call the office for appointment.  Please avoid alcohol, smoking, or any other illicit substance and maintain healthy habits including taking your regular medications as prescribed.  You were cared for by a hospitalist during your hospital stay. If you have any questions about your discharge medications or the care you received while you were in the hospital after you are discharged, you can call the unit and ask to speak with the hospitalist on call if the hospitalist that took care of you is not available.  Once you are discharged, your primary care physician will handle any further medical issues. Please note that NO REFILLS for any discharge medications will be authorized once you are discharged, as it is imperative that you return to your primary care physician (or establish a relationship with a primary care physician if you do not have one) for your aftercare needs so that they can reassess your need for medications and monitor your lab values   Increase activity slowly   Complete by: As directed    Increase activity slowly   Complete by: As directed       Allergies as of 06/06/2021       Reactions   Bee Venom Itching   Soap Rash        Medication List     STOP taking these medications    lithium carbonate 150 MG capsule       TAKE these medications    ferrous sulfate 325 (65 FE) MG tablet Take 325 mg by mouth daily.   lactulose 10 GM/15ML solution Commonly known as: CHRONULAC Take 30 mLs (20 g total) by mouth daily.   lamoTRIgine 200 MG tablet Commonly known as:  LaMICtal Take 1 tablet (200 mg total) by mouth daily.   loratadine 10 MG tablet Commonly known as: CLARITIN Take 10 mg by mouth daily.   LORazepam 1 MG tablet Commonly known as: ATIVAN Take 1 tablet (1 mg total) by mouth 2 (two) times daily.   OLANZapine 20 MG tablet Commonly known as: ZYPREXA Take 0.5 tablets (10 mg total) by mouth 2 (two) times daily. Half tablet by mouth twice daily   tamsulosin 0.4 MG Caps capsule Commonly known as: FLOMAX Take 1 capsule (0.4 mg total) by mouth daily.        Allergies  Allergen Reactions   Bee Venom Itching   Soap Rash    The results of significant diagnostics from this hospitalization (including imaging, microbiology, ancillary and laboratory) are listed below for reference.    Microbiology: Recent Results (from the past 240 hour(s))  Resp Panel by RT-PCR (Flu A&B, Covid) Nasopharyngeal Swab     Status: None   Collection Time: 06/05/21 10:20 AM   Specimen: Nasopharyngeal Swab; Nasopharyngeal(NP) swabs in vial transport medium  Result Value Ref Range Status   SARS Coronavirus 2 by RT PCR NEGATIVE  NEGATIVE Final    Comment: (NOTE) SARS-CoV-2 target nucleic acids are NOT DETECTED.  The SARS-CoV-2 RNA is generally detectable in upper respiratory specimens during the acute phase of infection. The lowest concentration of SARS-CoV-2 viral copies this assay can detect is 138 copies/mL. A negative result does not preclude SARS-Cov-2 infection and should not be used as the sole basis for treatment or other patient management decisions. A negative result may occur with  improper specimen collection/handling, submission of specimen other than nasopharyngeal swab, presence of viral mutation(s) within the areas targeted by this assay, and inadequate number of viral copies(<138 copies/mL). A negative result must be combined with clinical observations, patient history, and epidemiological information. The expected result is Negative.  Fact  Sheet for Patients:  EntrepreneurPulse.com.au  Fact Sheet for Healthcare Providers:  IncredibleEmployment.be  This test is no t yet approved or cleared by the [place] FDA and  has been authorized for detection and/or diagnosis of SARS-CoV-2 by FDA under an Emergency Use Authorization (EUA). This EUA will remain  in effect (meaning this test can be used) for the duration of the COVID-19 declaration under Section 564(b)(1) of the Act, 21 U.S.C.section 360bbb-3(b)(1), unless the authorization is terminated  or revoked sooner.       Influenza A by PCR NEGATIVE NEGATIVE Final   Influenza B by PCR NEGATIVE NEGATIVE Final    Comment: (NOTE) The Xpert Xpress SARS-CoV-2/FLU/RSV plus assay is intended as an aid in the diagnosis of influenza from Nasopharyngeal swab specimens and should not be used as a sole basis for treatment. Nasal washings and aspirates are unacceptable for Xpert Xpress SARS-CoV-2/FLU/RSV testing.  Fact Sheet for Patients: EntrepreneurPulse.com.au  Fact Sheet for Healthcare Providers: IncredibleEmployment.be  This test is not yet approved or cleared by the [place] FDA and has been authorized for detection and/or diagnosis of SARS-CoV-2 by FDA under an Emergency Use Authorization (EUA). This EUA will remain in effect (meaning this test can be used) for the duration of the COVID-19 declaration under Section 564(b)(1) of the Act, 21 U.S.C. section 360bbb-3(b)(1), unless the authorization is terminated or revoked.  Performed at [facility], [place] [address], [place], [place] [address]     Procedures/Studies: DG Abd 1 View  Result Date: 05/27/2021 CLINICAL DATA:  Abdominal pain, constipation EXAM: ABDOMEN - 1 VIEW COMPARISON:  None. FINDINGS: Bowel gas pattern is nonspecific. Large amount of stool is seen in colon. Lower portion of pelvis is not included in the radiograph limiting  evaluation of rectum. No abnormal masses or calcifications are seen. Kidneys are partly obscured by bowel contents. IMPRESSION: Large amount of stool is seen in the colon. Bowel gas pattern is otherwise unremarkable. Electronically Signed   By: Elmer Picker M.D.   On: 05/27/2021 10:28   CT Head Wo Contrast  Result Date: 05/14/2021 CLINICAL DATA:  Head trauma, moderate-severe. Unsteady gait. Pt fell today, leg weakness EXAM: CT HEAD WITHOUT CONTRAST TECHNIQUE: Contiguous axial images were obtained from the base of the skull through the vertex without intravenous contrast. COMPARISON:  CT head 10/15/2017 BRAIN: BRAIN Prominence of the lateral ventricles may be related to central predominant atrophy, although a component of normal pressure/communicating hydrocephalus cannot be excluded. No evidence of large-territorial acute infarction. No parenchymal hemorrhage. No mass lesion. No extra-axial collection. No mass effect or midline shift. No hydrocephalus. Basilar cisterns are patent. Vascular: No hyperdense vessel. Skull: No acute fracture or focal lesion. Sinuses/Orbits: Paranasal sinuses and mastoid air cells are clear. The orbits are  unremarkable. Other: None. IMPRESSION: 1. No acute intracranial abnormality. 2. Prominence of the lateral ventricles may be related to central predominant atrophy, although a component of normal pressure/communicating hydrocephalus cannot be excluded. Electronically Signed   By: Iven Finn M.D.   On: 05/14/2021 21:05   MR BRAIN WO CONTRAST  Result Date: 05/14/2021 CLINICAL DATA:  Initial evaluation for neuro deficit, stroke suspected. CT locking glass date fetal enough locking read it dome locking read no all dysfunction read 5 deflecting look at soccer practice EXAM: MRI HEAD WITHOUT CONTRAST TECHNIQUE: Multiplanar, multiecho pulse sequences of the brain and surrounding structures were obtained without intravenous contrast. COMPARISON:  Prior CT from earlier the  same day. FINDINGS: Brain: Mild diffuse prominence of the CSF containing spaces compatible with generalized cerebral atrophy, mildly advanced for age. No significant cerebral white matter disease. No abnormal foci of restricted diffusion to suggest acute or subacute ischemia. Gray-white matter differentiation maintained. No encephalomalacia to suggest chronic cortical infarction. No evidence for acute or chronic intracranial hemorrhage. No mass lesion, midline shift or mass effect. Mild ventricular prominence related to global parenchymal volume loss without hydrocephalus. No extra-axial fluid collection. Pituitary gland suprasellar region within normal limits. Midline structures intact and normal. Vascular: Major intracranial vascular flow voids are maintained. Skull and upper cervical spine: Craniocervical junction within normal limits. Degenerative spondylosis noted at C4-5 and C5-6 with resultant mild to moderate spinal stenosis. Bone marrow signal intensity within normal limits. No scalp soft tissue abnormality. Sinuses/Orbits: Globes and orbital soft tissues within normal limits. Paranasal sinuses are largely clear. Trace right mastoid effusion, of doubtful significance. Inner ear structures grossly normal. Other: None. IMPRESSION: 1. No acute intracranial infarct or other abnormality. 2. Mildly advanced cerebral atrophy for age. Electronically Signed   By: Jeannine Boga M.D.   On: 05/14/2021 22:53   MR LIVER WO CONRTAST  Addendum Date: 05/15/2021   ADDENDUM REPORT: 05/15/2021 15:34 ADDENDUM: Mild increased T2 signal in RIGHT paraspinous musculature about the lower lumbar spine at lower margin of the images submitted is of uncertain significance potentially representing myositis or early atrophy. Correlate with any symptoms in this location. These results were called by telephone at the time of interpretation on 05/15/2021 at 3:34 pm to provider [facility] , who verbally acknowledged these  results. Electronically Signed   By: Zetta Bills M.D.   On: 05/15/2021 15:34   Result Date: 05/15/2021 CLINICAL DATA:  Abnormal findings on previous renal sonogram. Suspected liver lesions in a 61 year old female. EXAM: MRI ABDOMEN WITHOUT CONTRAST TECHNIQUE: Multiplanar multisequence MR imaging was performed without the administration of intravenous contrast. COMPARISON:  Renal sonogram of the same date. FINDINGS: Lower chest: No effusion. No consolidation at the lung bases. Limited assessment on MRI of this area. Hepatobiliary: Numerous large hepatic metastatic lesions, largest centered in the LEFT hepatic lobe measuring 9.2 x 6.0 cm with mixed T2 signal predominantly hypointense on T1. At least 9 additional lesions. Next largest in the dome of the RIGHT hemi liver measuring 8.3 x 7.0 cm (image 5/4) Four additional lesions which exceed 5 cm in the RIGHT hepatic lobe more inferiorly with smaller lesions elsewhere. There is both LEFT and RIGHT hepatic lobe involvement. No biliary duct distension. No gallbladder distension or pericholecystic fluid. Mild prominence of the intrahepatic biliary tree related to extrinsic compression secondary to periportal adenopathy. Pancreas: Normal intrinsic T1 signal. No ductal dilation or sign of inflammation. No focal lesion. Spleen: Normal signal in the spleen with mild splenic enlargement approximately 13 cm  greatest craniocaudal dimension. Adrenals/Urinary Tract: RIGHT adrenal gland with mild thickening contiguous with hepatic mass. Nodular thickening of the LEFT adrenal gland (image 19/2) this measures approximately 15 mm, suggestion of signal loss on out of phase T1 weighted gradient echo imaging. Mild ureteral and collecting system distension. This is seen bilaterally. Diffuse infiltration of the kidneys with small cystic lesions, diffuse nature of this process is compatible with lithium toxicity in the setting of reported bipolar disorder. Stomach/Bowel: No acute  gastrointestinal process to the extent evaluated on this abdominal MRI, not well assessed. Vascular/Lymphatic: Bulky upper abdominal lymphadenopathy and periportal lymphadenopathy. (Image 15/3) 16 mm short axis lymph node in the hepatic gastric recess (Image 24/2) 17 mm periportal lymph node. (Image 26/2) 818 mm intra-aortocaval lymph node. Vascular structures are not well assessed, normal caliber on the current study. Other:  Leiomyomas partially visualized in the uterus. Musculoskeletal: No suspicious bone lesions identified. IMPRESSION: Numerous bilateral hepatic metastatic lesions largest centered in the medial segment LEFT hepatic lobe. Bulky upper abdominal lymphadenopathy and periportal lymphadenopathy, also compatible with metastatic process. Adenopathy is confined mainly to the upper abdomen. Mild splenomegaly. Correlate with any history of risk factors for liver disease/cirrhosis. This could also be related to diffuse hepatic infiltration. Liver is diffusely abnormal but without overt signs of cirrhosis. Mild prominence of the intrahepatic biliary tree related to extrinsic compression secondary to periportal adenopathy. Findings compatible with lithium toxicity, multiple tiny cystic areas, as involving the bilateral kidneys in this patient with reported history of bipolar disorder. Mild collecting system distension and distension of bilateral ureters, correlate with any signs of urinary retention. Urinary bladder is distended extending into the lower abdomen. Electronically Signed: By: Zetta Bills M.D. On: 05/15/2021 14:20   US RENAL  Result Date: 05/26/2021 CLINICAL DATA:  Acute kidney injury. EXAM: RENAL / URINARY TRACT ULTRASOUND COMPLETE COMPARISON:  CT, 05/19/2021.  MRI, 05/15/2021. FINDINGS: Right Kidney: Renal measurements: 10.2 x 4.4 x 4.6 cm = volume: 107.7 mL. Increased parenchymal echogenicity. No mass or focal lesion. No stone or hydronephrosis. Left Kidney: Renal measurements: 10.1 x 4.6  x 4.8 cm = volume: 117.7 mL. Increased parenchymal echogenicity. Questionable echogenic mass along the superior margin. This is likely not a mass since there was no abnormality in this location on the previous CT or MRI. Lower pole cyst measuring 2.2 x 2.2 x 1.7 cm. No other masses, no stones and no hydronephrosis. Bladder: Distended.  Some dependent debris.  No wall thickening or mass. Other: None. IMPRESSION: 1. No acute findings.  No hydronephrosis. 2. Bilateral echogenic kidneys consistent with medical renal disease. 3. 2.2 cm lower pole left renal cyst. Electronically Signed   By: Lajean Manes M.D.   On: 05/26/2021 11:29   US RENAL  Result Date: 05/15/2021 CLINICAL DATA:  Acute renal injury, superimposed on chronic renal disease. EXAM: RENAL / URINARY TRACT ULTRASOUND COMPLETE COMPARISON:  None. FINDINGS: Right Kidney: Renal measurements: 10.6 cm x 4.2 cm x 3.6 cm = volume: 83.7 mL. Diffusely increased echogenicity of the renal parenchyma is noted. No mass or hydronephrosis visualized. Left Kidney: Renal measurements: 8.9 cm x 4.7 cm x 3.4 cm = volume: 73.8 mL. Diffusely increased echogenicity of the renal parenchyma is noted. A 2.3 cm x 1.5 cm x 1.4 cm anechoic structure is seen within the lower pole of the left kidney. No abnormal flow is noted within this region on color Doppler evaluation. No hydronephrosis is visualized. Bladder: Appears normal for degree of bladder distention. Other: Of incidental note  is the presence of multiple large hyperechoic liver lesions. IMPRESSION: 1. Increased renal echogenicity, likely secondary to medical renal disease. 2. Simple renal cyst within the lower pole of the left kidney. 3. Large hyperechoic liver lesions. MRI correlation is recommended, as an underlying neoplastic process cannot be excluded. Electronically Signed   By: Virgina Norfolk M.D.   On: 05/15/2021 02:09   CT CHEST ABDOMEN PELVIS WO CONTRAST  Result Date: 05/19/2021 CLINICAL DATA:  Evaluate for  metastatic disease EXAM: CT CHEST, ABDOMEN AND PELVIS WITHOUT CONTRAST TECHNIQUE: Multidetector CT imaging of the chest, abdomen and pelvis was performed following the standard protocol without IV contrast. COMPARISON:  MR abdomen done on 05/15/2021 FINDINGS: CT CHEST FINDINGS Cardiovascular: Unremarkable. Mediastinum/Nodes: No significant lymphadenopathy seen in the mediastinum. Lungs/Pleura: There is no focal pulmonary consolidation. There are no discrete lung nodules. There is no pleural effusion or pneumothorax. CT ABDOMEN PELVIS FINDINGS Hepatobiliary: Liver measures 22.7 cm in length. There are multiple ill-defined low-density foci of varying sizes in the liver suggesting hepatic metastatic disease. There is no dilation of bile ducts. Gallbladder is unremarkable. Pancreas: No focal abnormality is seen. Spleen: Spleen measures 14.4 cm in maximum diameter. Adrenals/Urinary Tract: There is 2.1 cm nodule in the left adrenal. There is mild hyperplasia of right adrenal. There is no hydronephrosis. There are no renal or ureteral stones. There is 2.2 cm exophytic low-density lesion in the lower pole of left kidney, possibly a cyst. There is possible minimal calcification in the posterior margin of the cyst. Urinary bladder is distended without wall thickening. Stomach/Bowel: Stomach is unremarkable. Small bowel loops are not dilated. Appendix is not dilated. There is no significant wall thickening in the colon. There is no pericolic stranding. Large amount of stool is seen in the rectum. Transverse diameter of rectum measures 7.9 cm. Findings may suggest fecal impaction in the rectum. Moderate amount of stool is seen in colon. Vascular/Lymphatic: Vascular structures are essentially unremarkable. There are enlarged lymph nodes in retroperitoneum and periportal region in the upper abdomen. Largest of the nodes measures 2.5 x 1.7 cm along the posterior margin of left lobe of liver. Reproductive: There is inhomogeneous  attenuation in the enlarged uterus suggesting uterine fibroids. Other: There is no ascites or pneumoperitoneum. Musculoskeletal: Degenerative changes are noted in the lower cervical spine with spinal stenosis and encroachment of neural foramina. There is minimal anterolisthesis at C7-T1 level. There is first-degree anterolisthesis at L4-L5 level. Spinal stenosis and encroachment of neural foramina is seen at L4-L5 level. Schmorl's node is seen in the upper endplate of body of W10 vertebra. No definite focal lytic or lesions are seen. IMPRESSION: Study was done without intravenous contrast limiting evaluation. Enlarged liver with multiple space-occupying lesions of varying sizes consistent with extensive hepatic metastatic disease. Enlarged spleen. There are abnormally enlarged lymph nodes in the retroperitoneum and periportal region in the epigastrium suggesting possible metastatic disease. There is 2.1 cm nodule in the left adrenal which may suggest incidental adenoma or metastatic disease. There is no significant lymphadenopathy in the mediastinum. There are no discrete lung nodules. There are no focal lytic or sclerotic lesions in bony structures. Cervical spondylosis with spinal stenosis and encroachment of neural foramina in the visualized lower cervical spine. Lumbar spondylosis with spinal stenosis and encroachment of neural foramina at L4-L5 level. Enlarged uterus with possible fibroids. There is fecal impaction in the rectum. Other findings as described in the body of the report. Electronically Signed   By: Elmer Picker M.D.   On:  05/19/2021 14:06    Labs: BNP (last 3 results) No results for input(s): BNP in the last 8760 hours. Basic Metabolic Panel: No results for input(s): NA, K, CL, CO2, GLUCOSE, BUN, CREATININE, CALCIUM, MG, PHOS in the last 168 hours. Liver Function Tests: No results for input(s): AST, ALT, ALKPHOS, BILITOT, PROT, ALBUMIN in the last 168 hours. No results for input(s):  LIPASE, AMYLASE in the last 168 hours. No results for input(s): AMMONIA in the last 168 hours. CBC: No results for input(s): WBC, NEUTROABS, HGB, HCT, MCV, PLT in the last 168 hours. Cardiac Enzymes: No results for input(s): CKTOTAL, CKMB, CKMBINDEX, TROPONINI in the last 168 hours. BNP: Invalid input(s): POCBNP CBG: No results for input(s): GLUCAP in the last 168 hours. D-Dimer No results for input(s): DDIMER in the last 72 hours. Hgb A1c No results for input(s): HGBA1C in the last 72 hours. Lipid Profile No results for input(s): CHOL, HDL, LDLCALC, TRIG, CHOLHDL, LDLDIRECT in the last 72 hours. Thyroid function studies No results for input(s): TSH, T4TOTAL, T3FREE, THYROIDAB in the last 72 hours.  Invalid input(s): FREET3 Anemia work up No results for input(s): VITAMINB12, FOLATE, FERRITIN, TIBC, IRON, RETICCTPCT in the last 72 hours. Urinalysis    Component Value Date/Time   COLORURINE YELLOW 05/26/2021 1151   APPEARANCEUR HAZY (A) 05/26/2021 1151   LABSPEC 1.006 05/26/2021 1151   PHURINE 5.0 05/26/2021 1151   GLUCOSEU NEGATIVE 05/26/2021 1151   HGBUR SMALL (A) 05/26/2021 1151   BILIRUBINUR NEGATIVE 05/26/2021 1151   KETONESUR NEGATIVE 05/26/2021 1151   PROTEINUR NEGATIVE 05/26/2021 1151   NITRITE NEGATIVE 05/26/2021 1151   LEUKOCYTESUR LARGE (A) 05/26/2021 1151   Sepsis Labs Invalid input(s): PROCALCITONIN,  WBC,  LACTICIDVEN Microbiology Recent Results (from the past 240 hour(s))  Resp Panel by RT-PCR (Flu A&B, Covid) Nasopharyngeal Swab     Status: None   Collection Time: 06/05/21 10:20 AM   Specimen: Nasopharyngeal Swab; Nasopharyngeal(NP) swabs in vial transport medium  Result Value Ref Range Status   SARS Coronavirus 2 by RT PCR NEGATIVE NEGATIVE Final    Comment: (NOTE) SARS-CoV-2 target nucleic acids are NOT DETECTED.  The SARS-CoV-2 RNA is generally detectable in upper respiratory specimens during the acute phase of infection. The lowest concentration of  SARS-CoV-2 viral copies this assay can detect is 138 copies/mL. A negative result does not preclude SARS-Cov-2 infection and should not be used as the sole basis for treatment or other patient management decisions. A negative result may occur with  improper specimen collection/handling, submission of specimen other than nasopharyngeal swab, presence of viral mutation(s) within the areas targeted by this assay, and inadequate number of viral copies(<138 copies/mL). A negative result must be combined with clinical observations, patient history, and epidemiological information. The expected result is Negative.  Fact Sheet for Patients:  EntrepreneurPulse.com.au  Fact Sheet for Healthcare Providers:  IncredibleEmployment.be  This test is no t yet approved or cleared by the [place] FDA and  has been authorized for detection and/or diagnosis of SARS-CoV-2 by FDA under an Emergency Use Authorization (EUA). This EUA will remain  in effect (meaning this test can be used) for the duration of the COVID-19 declaration under Section 564(b)(1) of the Act, 21 U.S.C.section 360bbb-3(b)(1), unless the authorization is terminated  or revoked sooner.       Influenza A by PCR NEGATIVE NEGATIVE Final   Influenza B by PCR NEGATIVE NEGATIVE Final    Comment: (NOTE) The Xpert Xpress SARS-CoV-2/FLU/RSV plus assay is intended as an aid in  the diagnosis of influenza from Nasopharyngeal swab specimens and should not be used as a sole basis for treatment. Nasal washings and aspirates are unacceptable for Xpert Xpress SARS-CoV-2/FLU/RSV testing.  Fact Sheet for Patients: EntrepreneurPulse.com.au  Fact Sheet for Healthcare Providers: IncredibleEmployment.be  This test is not yet approved or cleared by the [place] FDA and has been authorized for detection and/or diagnosis of SARS-CoV-2 by FDA under an Emergency Use  Authorization (EUA). This EUA will remain in effect (meaning this test can be used) for the duration of the COVID-19 declaration under Section 564(b)(1) of the Act, 21 U.S.C. section 360bbb-3(b)(1), unless the authorization is terminated or revoked.  Performed at [facility], [place] [address], [place], [place] [address]      Time coordinating discharge: 25 minutes  SIGNED: Antonieta Pert, MD  [facility] 06/06/2021, 12:03 PM  If 7PM-7AM, please contact night-coverage [email or web address]   25

## 2021-06-06 NOTE — Progress Notes (Signed)
Patient discharged to [facility] via [facility]. Patient's belongings picked up by brother [name].

## 2021-06-06 NOTE — Plan of Care (Signed)
°  Problem: Clinical Measurements: °Goal: Ability to maintain clinical measurements within normal limits will improve °Outcome: Progressing °  °Problem: Activity: °Goal: Risk for activity intolerance will decrease °Outcome: Progressing °  °Problem: Nutrition: °Goal: Adequate nutrition will be maintained °Outcome: Progressing °  °

## 2021-06-06 NOTE — Progress Notes (Signed)
Patient seen and examined She is alert awake oriented to self current place Denies any pain. Drinking ok. Plan is for skilled nursing facility disposition once bed available, discharge summary completed awaiting for placement/insurance authorization. Discussed in the multidisciplinary rounds with nursing staff and TOC

## 2021-06-06 NOTE — Progress Notes (Signed)
Occupational Therapy Treatment Patient Details Name: Megan Miranda MRN: [id number] DOB: 02/07/1961 Today's Date: 06/06/2021   History of present illness Megan Miranda is a 61 y.o. female who presented 05/14/2021 to the ED for evaluation of generalized weakness/unstable gait and falls where legs gave out. Found to have acute kidney injury superimposed on chronic kidney disease, MRI brain negative for acute event. MRI revealed  multiple lesions in the liver concerning for metastatic process.Significant medical history for bipolar 1 disorder, schizoaffective disorder, CKD stage IIIb   OT comments  Pt. Seen for skilled OT treatment.  Focus of session was on grooming task completion.  Pt. Able to complete several bed level tasks with one step instructions and tactile guidance min a.  Physical assistance for bed mobility and repositioning.  Current d/c recommendations remain appropriate.     Recommendations for follow up therapy are one component of a multi-disciplinary discharge planning process, led by the attending physician.  Recommendations may be updated based on patient status, additional functional criteria and insurance authorization.    Follow Up Recommendations  Skilled nursing-short term rehab (<3 hours/day)    Assistance Recommended at Discharge Frequent or constant Supervision/Assistance  Patient can return home with the following  Two people to help with walking and/or transfers;Two people to help with bathing/dressing/bathroom   Equipment Recommendations  Other (comment)    Recommendations for Other Services      Precautions / Restrictions Precautions Precautions: Fall Precaution Comments: impulsive       Mobility Bed Mobility Overal bed mobility: Needs Assistance             General bed mobility comments: mod a with max cues to scoot up in bed and reposition. cont. to roll R and attempt to pull at catheter. max cues for repositioning and not to pull on catheter     Transfers                         Balance                                           ADL either performed or assessed with clinical judgement   ADL Overall ADL's : Needs assistance/impaired     Grooming: Wash/dry hands;Wash/dry face;Sitting;Bed level;Minimal assistance Grooming Details (indicate cue type and reason): min A for verbal cues and to initiate the task, also applied hand lotion                               General ADL Comments: grooming tasks bed level. pt. able to assist with bed mobility for better positioning in bed max cues    Extremity/Trunk Assessment              Vision       Perception     Praxis      Cognition Arousal/Alertness: Awake/alert Behavior During Therapy: Restless;Impulsive Overall Cognitive Status: No family/caregiver present to determine baseline cognitive functioning Area of Impairment: Orientation;Memory;Attention;Safety/judgement;Following commands;Awareness;Problem solving                 Orientation Level: Disoriented to;Time;Situation;Place Current Attention Level: Focused Memory: Decreased short-term memory Following Commands: Follows one step commands with increased time Safety/Judgement: Decreased awareness of deficits;Decreased awareness of safety Awareness: Intellectual Problem Solving: Slow processing;Decreased initiation;Difficulty sequencing;Requires verbal cues;Requires tactile  cues General Comments: pt follows one step commands with incrased time and multimodial cues. verbalizing need of water suction? and a smoothie throughout session, other verbalizations are incoherent.          Exercises     Shoulder Instructions       General Comments      Pertinent Vitals/ Pain       Pain Assessment: No/denies pain  Home Living                                          Prior Functioning/Environment              Frequency  Min 2X/week         Progress Toward Goals  OT Goals(current goals can now be found in the care plan section)  Progress towards OT goals: Progressing toward goals     Plan Discharge plan remains appropriate    Co-evaluation                 AM-PAC OT "6 Clicks" Daily Activity     Outcome Measure   Help from another person eating meals?: None Help from another person taking care of personal grooming?: A Little Help from another person toileting, which includes using toliet, bedpan, or urinal?: A Lot Help from another person bathing (including washing, rinsing, drying)?: A Lot Help from another person to put on and taking off regular upper body clothing?: A Lot Help from another person to put on and taking off regular lower body clothing?: Total 6 Click Score: 14    End of Session    OT Visit Diagnosis: Unsteadiness on feet (R26.81);Other abnormalities of gait and mobility (R26.89);Muscle weakness (generalized) (M62.81);Pain;Other symptoms and signs involving cognitive function Pain - Right/Left: Right Pain - part of body: Leg   Activity Tolerance Patient tolerated treatment well   Patient Left in bed;with call bell/phone within reach;with bed alarm set   Nurse Communication          Time: 7062-3762 OT Time Calculation (min): 8 min  Charges: OT General Charges $OT Visit: 1 Visit OT Treatments $Self Care/Home Management : 8-22 mins  Sonia Baller, COTA/L Acute Rehabilitation [phone]   Tanya Nones 06/06/2021, 9:16 AM

## 2021-06-06 NOTE — TOC Transition Note (Signed)
Transition of Care [facility]) - CM/SW Discharge Note   Patient Details  Name: Megan Miranda MRN: [id number] Date of Birth: Oct 09, 1960  [facility]) CM/SW Contact:  Milinda Antis, [place] Phone Number: 06/06/2021, 2:21 PM   Clinical Narrative:    Patient will DC to:  SNF Anticipated DC date:  06/06/2021 Family notified: Yes Transport by: Corey Harold   Per MD patient ready for DC to SNF. RN to call report prior to discharge [phone]. RN, patient, patient's family, and facility notified of DC. Discharge Summary and FL2 sent to facility. DC packet on chart. Ambulance transport requested for patient.   CSW will sign off for now as social work intervention is no longer needed. Please consult [place] again if new needs arise.     Final next level of care: Skilled Nursing Facility Barriers to Discharge: Barriers Resolved   Patient Goals and CMS Choice Patient states their goals for this hospitalization and ongoing recovery are:: Rehab CMS Medicare.gov Compare Post Acute Care list provided to:: Patient Represenative (must comment) Choice offered to / list presented to : [facility] POA / Guardian, Sibling (Brother)  Discharge Placement              Patient chooses bed at:  ([facility]) Patient to be transferred to facility by: [facility] Name of family member notified: [name] (Brother)   [phone] Patient and family notified of of transfer: 06/06/21  Discharge Plan and Services In-house Referral: Clinical Social Work   Post Acute Care Choice: [place]                               Social Determinants of Health (SDOH) Interventions     Readmission Risk Interventions No flowsheet data found.

## 2021-06-06 NOTE — Plan of Care (Signed)
°  Problem: Acute Rehab PT Goals(only PT should resolve) Goal: Pt Will Go Supine/Side To Sit Outcome: Adequate for Discharge Goal: Patient Will Perform Sitting Balance Outcome: Adequate for Discharge Goal: Patient Will Transfer Sit To/From Stand Outcome: Adequate for Discharge Goal: Pt Will Transfer Bed To Chair/Chair To Bed Outcome: Adequate for Discharge Goal: Pt Will Ambulate Outcome: Adequate for Discharge   Problem: Acute Rehab OT Goals (only OT should resolve) Goal: Pt. Will Perform Grooming Outcome: Adequate for Discharge Goal: Pt. Will Perform Upper Body Bathing Outcome: Adequate for Discharge Goal: Pt. Will Perform Lower Body Bathing Outcome: Adequate for Discharge Goal: Pt. Will Perform Upper Body Dressing Outcome: Adequate for Discharge Goal: Pt. Will Perform Lower Body Dressing Outcome: Adequate for Discharge Goal: Pt. Will Transfer To Toilet Outcome: Adequate for Discharge Goal: Pt. Will Perform Toileting-Clothing Manipulation Outcome: Adequate for Discharge Goal: OT Additional ADL Goal #1 Outcome: Adequate for Discharge   Problem: Health Behavior/Discharge Planning: Goal: Ability to manage health-related needs will improve Outcome: Adequate for Discharge   Problem: Clinical Measurements: Goal: Ability to maintain clinical measurements within normal limits will improve 06/06/2021 1303 by Dolores Hoose, RN Outcome: Adequate for Discharge 06/06/2021 0739 by Dolores Hoose, RN Outcome: Progressing Goal: Will remain free from infection Outcome: Adequate for Discharge   Problem: Activity: Goal: Risk for activity intolerance will decrease 06/06/2021 1303 by Dolores Hoose, RN Outcome: Adequate for Discharge 06/06/2021 0739 by Dolores Hoose, RN Outcome: Progressing   Problem: Nutrition: Goal: Adequate nutrition will be maintained 06/06/2021 1303 by Dolores Hoose, RN Outcome: Adequate for Discharge 06/06/2021 0739 by Dolores Hoose,  RN Outcome: Progressing   Problem: Coping: Goal: Level of anxiety will decrease Outcome: Adequate for Discharge   Problem: Elimination: Goal: Will not experience complications related to bowel motility Outcome: Adequate for Discharge Goal: Will not experience complications related to urinary retention Outcome: Adequate for Discharge   Problem: Safety: Goal: Ability to remain free from injury will improve Outcome: Adequate for Discharge   Problem: Skin Integrity: Goal: Risk for impaired skin integrity will decrease Outcome: Adequate for Discharge

## 2021-06-06 NOTE — TOC Progression Note (Signed)
Transition of Care [facility]) - Initial/Assessment Note    Patient Details  Name: Megan Miranda MRN: [id number] Date of Birth: November 02, 1960  [facility]) CM/SW Contact:    Milinda Antis, LCSWA Phone Number: 06/06/2021, 11:13 AM  Clinical Narrative:                 CSW spoke with the patient's Geriatric Advocate/Jennifer  Harris # [phone] and updated her on the status of SNF placement.    Pending: insurance authorization  Expected Discharge Plan: Skilled Nursing Facility Barriers to Discharge: Continued Medical Work up, Ship broker, SNF Pending bed offer   Patient Goals and CMS Choice Patient states their goals for this hospitalization and ongoing recovery are:: Rehab CMS Medicare.gov Compare Post Acute Care list provided to:: Patient Represenative (must comment) Choice offered to / list presented to : [facility] POA / Guardian, Sibling (Brother)  Expected Discharge Plan and Services Expected Discharge Plan: [place] In-house Referral: Clinical Social Work   Post Acute Care Choice: [place] Living arrangements for the past 2 months: [place] Expected Discharge Date: 06/05/21                                    Prior Living Arrangements/Services Living arrangements for the past 2 months: [place] Lives with:: Facility Resident Patient language and need for interpreter reviewed:: Yes Do you feel safe going back to the place where you live?: Yes      Need for Family Participation in Patient Care: Yes (Comment) Care giver support system in place?: Yes (comment)   Criminal Activity/Legal Involvement Pertinent to Current Situation/Hospitalization: No - Comment as needed  Activities of Daily Living Home Assistive Devices/Equipment: Bedside commode/3-in-1 ADL Screening (condition at time of admission) Patient's cognitive ability adequate to safely complete daily activities?: No Is the patient  deaf or have difficulty hearing?: No Does the patient have difficulty seeing, even when wearing glasses/contacts?: No Does the patient have difficulty concentrating, remembering, or making decisions?: No Patient able to express need for assistance with ADLs?: Yes Does the patient have difficulty dressing or bathing?: No Independently performs ADLs?: Yes (appropriate for developmental age) Does the patient have difficulty walking or climbing stairs?: No Weakness of Legs: None Weakness of Arms/Hands: None  Permission Sought/Granted Permission sought to share information with : Facility Sport and exercise psychologist, Family Supports Permission granted to share information with : Yes, Verbal Permission Granted  Share Information with NAME: [name]  Permission granted to share info w AGENCY: [facility]  Permission granted to share info w Relationship: Brother/Legal Guardian  Permission granted to share info w Contact Information: [phone]  Emotional Assessment Appearance:: Appears stated age     Orientation: : Oriented to Self, Oriented to Place, Oriented to  Time, Oriented to Situation Alcohol / Substance Use: Not Applicable Psych Involvement: No (comment)  Admission diagnosis:  Unsteady gait [R26.81] AKI (acute kidney injury) ([place]) [N17.9] Acute kidney injury superimposed on chronic kidney disease ([place]) [N17.9, N18.9] Patient Active Problem List   Diagnosis Date Noted   Goals of care, counseling/discussion 06/01/2021   Acute urinary retention 06/01/2021   Delirium due to another medical condition 05/16/2021   Generalized weakness 05/15/2021   AKI (acute kidney injury) ([place]) 05/15/2021   Acute renal failure superimposed on stage 3b chronic kidney disease ([place]) 05/14/2021   Schizoaffective disorder, manic type ([place]) 12/03/2018   SIRS (systemic inflammatory  response syndrome) ([place]) 10/16/2017   Lactic acidosis 10/16/2017   Abnormal TSH 10/16/2017   History of alcohol abuse  10/16/2017   Lithium toxicity 10/15/2017   Bipolar I disorder ([place]) 07/21/2007   PCP:  Patient, No Pcp Per (Inactive) Pharmacy:   [facility] [place], [place] [address] Phone: [phone] Fax: [phone]     Social Determinants of Health (SDOH) Interventions    Readmission Risk Interventions No flowsheet data found.

## 2021-06-06 NOTE — Progress Notes (Signed)
Report called to skilled nursing facility staff. All questions answered with discharge packet in the chart. Ambulance transport in place for patient pick up.   Notified brother [name] to pick up some personal belongings from room and stated he will be here to pick them up.

## 2021-06-23 ENCOUNTER — Emergency Department (HOSPITAL_COMMUNITY): Payer: BC Managed Care – PPO

## 2021-06-23 ENCOUNTER — Encounter (HOSPITAL_COMMUNITY): Payer: Self-pay | Admitting: Emergency Medicine

## 2021-06-23 ENCOUNTER — Other Ambulatory Visit: Payer: Self-pay

## 2021-06-23 ENCOUNTER — Emergency Department (HOSPITAL_COMMUNITY)
Admission: EM | Admit: 2021-06-23 | Discharge: 2021-06-23 | Disposition: A | Discharge status: Skilled Nursing Facility | Payer: BC Managed Care – PPO | Attending: Emergency Medicine | Admitting: Emergency Medicine

## 2021-06-23 DIAGNOSIS — W06XXXA Fall from bed, initial encounter: Secondary | ICD-10-CM | POA: Insufficient documentation

## 2021-06-23 DIAGNOSIS — R0981 Nasal congestion: Secondary | ICD-10-CM | POA: Insufficient documentation

## 2021-06-23 DIAGNOSIS — W19XXXA Unspecified fall, initial encounter: Secondary | ICD-10-CM

## 2021-06-23 DIAGNOSIS — Z043 Encounter for examination and observation following other accident: Secondary | ICD-10-CM | POA: Diagnosis not present

## 2021-06-23 DIAGNOSIS — R000 Tachycardia, unspecified: Secondary | ICD-10-CM | POA: Insufficient documentation

## 2021-06-23 DIAGNOSIS — F29 Unspecified psychosis not due to a substance or known physiological condition: Secondary | ICD-10-CM | POA: Insufficient documentation

## 2021-06-23 MED ORDER — SODIUM CHLORIDE 0.9 % IV BOLUS
1000.0000 mL | Freq: Once | INTRAVENOUS | Status: AC
  Administered 2021-06-23: 1000 mL via INTRAVENOUS

## 2021-06-23 NOTE — ED Triage Notes (Signed)
BIB EMS from [facility] for unwitnessed fall from bed, when pt was found her neck was "twisted to the left," per facility. Pt is not on blood thinners, unable to assess LOC, pt is alert and oriented at baseline and has hx of fall

## 2021-06-23 NOTE — ED Provider Notes (Signed)
[facility] EMERGENCY DEPARTMENT Provider Note   CSN: [id number] Arrival date & time: 06/23/21  9476     History  Chief Complaint  Patient presents with   Megan Miranda is a 61 y.o. female.  This is a 61 y.o. female  with significant medical history as below, including schizophrenia, bipolar disorder who presents to the ED with complaint of fall, arrived  via EMS.  From SNF.  Patient reports she fell from her bed trying to get out.  She fell onto the carpeted floor.  Unsure if she hit her head.  Does not think she had LOC.  No thinners.  She ambulates very little at baseline.  She has no headache, nausea or vomiting.  Patient is requesting something to drink.  Appears to be at her baseline per EMS at bedside.  No belly pain, chest pain no dyspnea.  No numbness or tingling.  No vision or hearing changes.   Level 5 caveat, psychotic disorder    Past Medical History: No date: Bipolar disorder ([place]) No date: Schizoaffective disorder ([place])  No past surgical history on file.    The history is provided by the patient and the EMS personnel. No language interpreter was used.      Home Medications Prior to Admission medications   Medication Sig Start Date End Date Taking? Authorizing Provider  ferrous sulfate 325 (65 FE) MG tablet Take 325 mg by mouth daily.   Yes [provider]  lactulose (CHRONULAC) 10 GM/15ML solution Take 30 mLs (20 g total) by mouth daily. 05/24/21  Yes Regalado, Belkys A, MD  lamoTRIgine (LAMICTAL) 200 MG tablet Take 1 tablet (200 mg total) by mouth daily. 04/14/18  Yes Plovsky, Berneta Sages, MD  loratadine (CLARITIN) 10 MG tablet Take 10 mg by mouth daily.   Yes [provider]  LORazepam (ATIVAN) 1 MG tablet Take 1 tablet (1 mg total) by mouth 2 (two) times daily. 05/23/21  Yes Regalado, Belkys A, MD  OLANZapine (ZYPREXA) 20 MG tablet Take 0.5 tablets (10 mg total) by mouth 2 (two) times daily. Half tablet by mouth twice  daily 05/23/21  Yes Regalado, Belkys A, MD  tamsulosin (FLOMAX) 0.4 MG CAPS capsule Take 1 capsule (0.4 mg total) by mouth daily. 05/24/21  Yes Regalado, Belkys A, MD      Allergies    Bee venom and Soap    Review of Systems   Review of Systems  Constitutional:  Negative for chills and fever.  HENT:  Negative for facial swelling and trouble swallowing.   Eyes:  Negative for photophobia and visual disturbance.  Respiratory:  Negative for cough and shortness of breath.   Cardiovascular:  Negative for chest pain and palpitations.  Gastrointestinal:  Negative for abdominal pain, nausea and vomiting.  Endocrine: Negative for polydipsia and polyuria.  Genitourinary:  Negative for difficulty urinating and hematuria.  Musculoskeletal:  Negative for gait problem and joint swelling.  Skin:  Negative for pallor and rash.  Neurological:  Negative for syncope and headaches.  Psychiatric/Behavioral:  Negative for agitation and confusion.    Physical Exam Updated Vital Signs BP 96/77    Pulse (!) 111    Temp 98.7 F (37.1 C) (Oral)    Resp 18    Ht 5\' 5"  (1.651 m)    Wt 57.5 kg    SpO2 95%    BMI 21.09 kg/m  Physical Exam Vitals and nursing note reviewed.  Constitutional:  General: She is not in acute distress.    Appearance: Normal appearance.     Comments: Frail-appearing  HENT:     Head: Normocephalic and atraumatic.     Right Ear: External ear normal.     Left Ear: External ear normal.     Nose: Congestion present.     Comments: Dry    Mouth/Throat:     Mouth: Mucous membranes are dry.  Eyes:     General: No scleral icterus.       Right eye: No discharge.        Left eye: No discharge.     Extraocular Movements: Extraocular movements intact.     Pupils: Pupils are equal, round, and reactive to light.  Neck:     Comments: C-collar precautions Cardiovascular:     Rate and Rhythm: Normal rate and regular rhythm.     Pulses: Normal pulses.     Heart sounds: Normal heart  sounds.  Pulmonary:     Effort: Pulmonary effort is normal. No respiratory distress.     Breath sounds: Normal breath sounds.  Abdominal:     General: Abdomen is flat.     Tenderness: There is no abdominal tenderness.  Musculoskeletal:        General: Normal range of motion.     Cervical back: Normal range of motion.     Right lower leg: No edema.     Left lower leg: No edema.  Skin:    General: Skin is warm and dry.     Capillary Refill: Capillary refill takes less than 2 seconds.  Neurological:     Mental Status: She is alert and oriented to person, place, and time.     GCS: GCS eye subscore is 4. GCS verbal subscore is 5. GCS motor subscore is 6.     Cranial Nerves: Cranial nerves 2-12 are intact.     Sensory: Sensation is intact.     Motor: Motor function is intact.     Coordination: Coordination is intact.     Comments: Gait not tested secondary to C-spine protection  Moving all 4 extremities spontaneously.  Variable compliance with neurologic exam  Psychiatric:        Mood and Affect: Mood normal.        Behavior: Behavior normal. Behavior is cooperative.    ED Results / Procedures / Treatments   Labs (all labs ordered are listed, but only abnormal results are displayed) Labs Reviewed - No data to display  EKG EKG Interpretation  Date/Time:  Monday June 23 2021 14:44:50 EST Ventricular Rate:  117 PR Interval:  148 QRS Duration: 90 QT Interval:  319 QTC Calculation: 445 R Axis:   -10 Text Interpretation: Sinus tachycardia RAE, consider biatrial enlargement Low voltage, precordial leads Nonspecific T abnormalities, lateral leads Confirmed by Wynona Dove ([id number]) on 06/23/2021 5:18:09 PM  Radiology CT Head Wo Contrast  Result Date: 06/23/2021 CLINICAL DATA:  Unwitnessed fall from bed.  Psychiatric disorder. EXAM: CT HEAD WITHOUT CONTRAST TECHNIQUE: Contiguous axial images were obtained from the base of the skull through the vertex without intravenous contrast.  RADIATION DOSE REDUCTION: This exam was performed according to the departmental dose-optimization program which includes automated exposure control, adjustment of the mA and/or kV according to patient size and/or use of iterative reconstruction technique. COMPARISON:  05/14/2021 FINDINGS: Brain: Generalized atrophy. No sign of old or acute focal infarction, mass lesion, hemorrhage, hydrocephalus or extra-axial collection. Ventricles are in proportion to the degree brain volume  loss. Vascular: No abnormal vascular finding. Skull: Normal.  No fracture. Sinuses/Orbits: Clear/normal Other: None IMPRESSION: No acute or traumatic finding.  Generalized atrophy. Electronically Signed   By: Nelson Chimes M.D.   On: 06/23/2021 09:35   CT Cervical Spine Wo Contrast  Result Date: 06/23/2021 CLINICAL DATA:  Unwitnessed fall from bed.  Psychiatric disorder. EXAM: CT CERVICAL SPINE WITHOUT CONTRAST TECHNIQUE: Multidetector CT imaging of the cervical spine was performed without intravenous contrast. Multiplanar CT image reconstructions were also generated. RADIATION DOSE REDUCTION: This exam was performed according to the departmental dose-optimization program which includes automated exposure control, adjustment of the mA and/or kV according to patient size and/or use of iterative reconstruction technique. COMPARISON:  None. FINDINGS: Alignment: Mild scoliotic curvature convex to the right. Straightening and slight kyphosis in the cervical region. 2 mm degenerative anterolisthesis at C3-4 and C7-T1. Skull base and vertebrae: No regional fracture or focal bone lesion. Soft tissues and spinal canal: Negative for evidence of soft tissue injury. Disc levels: No significant finding from the foramen magnum through C2-3. C3-4: Facet osteoarthritis on the left with 2 mm of anterolisthesis. Left-sided uncovertebral hypertrophy. Left foraminal narrowing. C4-5, C5-6 and C6-7: Degenerative spondylosis with loss of disc height and endplate  osteophytes. Moderate canal stenosis at these levels. Moderate foraminal stenosis. C7-T1: Chronic facet arthropathy with 2 mm of anterolisthesis. Mild bilateral foraminal stenosis. Upper chest: Negative Other: None IMPRESSION: No acute or traumatic finding. Chronic cervical degenerative changes as above. Moderate canal stenosis from C4-5 through C6-7. Electronically Signed   By: Nelson Chimes M.D.   On: 06/23/2021 09:38   DG Chest Portable 1 View  Result Date: 06/23/2021 CLINICAL DATA:  Fall, altered mental status is in a 61 year old female EXAM: PORTABLE CHEST 1 VIEW COMPARISON:  Oct 15, 2017 and CT of the chest, abdomen and pelvis of May 19, 2021. FINDINGS: EKG leads project over the chest. Cardiomediastinal contours and hilar structures are normal. Lungs are clear.  No gross effusion. On limited assessment there is no acute skeletal process. IMPRESSION: No acute cardiopulmonary disease. Electronically Signed   By: Zetta Bills M.D.   On: 06/23/2021 08:50    Procedures Procedures    Medications Ordered in ED Medications  sodium chloride 0.9 % bolus 1,000 mL (0 mLs Intravenous Stopped 06/23/21 1324)    ED Course/ Medical Decision Making/ A&P                           Medical Decision Making Amount and/or Complexity of Data Reviewed Radiology: ordered.    CC: Fall, possible head injury  This patient presents to the Emergency Department for the above complaint. This involves an extensive number of treatment options and is a complaint that carries with it a high risk of complications and morbidity. Vital signs were reviewed. Serious etiologies considered.  Record review:  Previous records obtained and reviewed   Additional history obtained from EMS personnel  Medical and surgical history as noted above.   Work up as above, notable for:  imaging results that were available during my care of the patient were reviewed by me and considered in my medical decision making.   I  ordered imaging studies which included CT head, CT cervical spine, chest x-ray and I independently visualized and interpreted imaging which showed no acute process  Cardiac monitoring reviewed and interpreted personally which shows sinus tachycardia  Social determinants of health include -SNF resident, psychiatric disorder  Management: P.o. challenge  Reassessment:  Pt reports feeling much better. Imaging reviewed and negative for fx  Tachycardia has improved while in the ED, no CP or palpitations. She received IVF and is tolerating PO appropriately.   Pt feels as though she is back at baseline, eating and drinking appropriately. Interactive. Neuro exam is non-focal. Stable for dc back to facility   The patient improved significantly and was discharged in stable condition. Detailed discussions were had with the patient regarding current findings, and need for close f/u with PCP or on call doctor. The patient has been instructed to return immediately if the symptoms worsen in any way for re-evaluation. Patient verbalized understanding and is in agreement with current care plan. All questions answered prior to discharge.         This chart was dictated using voice recognition software.  Despite best efforts to proofread,  errors can occur which can change the documentation meaning.         Final Clinical Impression(s) / ED Diagnoses Final diagnoses:  Fall, initial encounter  Tachycardia    Rx / DC Orders ED Discharge Orders     None         Jeanell Sparrow, DO 06/23/21 1720

## 2021-06-23 NOTE — Discharge Instructions (Addendum)
It was a pleasure caring for you today in the emergency department. ° °Please return to the emergency department for any worsening or worrisome symptoms. ° ° °

## 2021-06-23 NOTE — ED Notes (Signed)
Report called to [facility] [phone] and report given to Hanley Ben, RN

## 2021-06-23 NOTE — ED Notes (Signed)
Pt tolerated PO crackers and water

## 2021-07-16 ENCOUNTER — Emergency Department (HOSPITAL_COMMUNITY): Payer: BC Managed Care – PPO

## 2021-07-16 ENCOUNTER — Inpatient Hospital Stay (HOSPITAL_COMMUNITY)
Admission: EM | Admit: 2021-07-16 | Discharge: 2021-08-23 | DRG: 871 | Disposition: E | Payer: BC Managed Care – PPO | Source: Skilled Nursing Facility | Attending: Family Medicine | Admitting: Family Medicine

## 2021-07-16 ENCOUNTER — Other Ambulatory Visit: Payer: Self-pay

## 2021-07-16 DIAGNOSIS — G9340 Encephalopathy, unspecified: Secondary | ICD-10-CM | POA: Diagnosis not present

## 2021-07-16 DIAGNOSIS — E860 Dehydration: Secondary | ICD-10-CM | POA: Diagnosis present

## 2021-07-16 DIAGNOSIS — E43 Unspecified severe protein-calorie malnutrition: Secondary | ICD-10-CM | POA: Diagnosis present

## 2021-07-16 DIAGNOSIS — R4182 Altered mental status, unspecified: Secondary | ICD-10-CM | POA: Diagnosis not present

## 2021-07-16 DIAGNOSIS — D631 Anemia in chronic kidney disease: Secondary | ICD-10-CM | POA: Diagnosis present

## 2021-07-16 DIAGNOSIS — N1832 Chronic kidney disease, stage 3b: Secondary | ICD-10-CM | POA: Diagnosis present

## 2021-07-16 DIAGNOSIS — Z66 Do not resuscitate: Secondary | ICD-10-CM | POA: Diagnosis present

## 2021-07-16 DIAGNOSIS — G9341 Metabolic encephalopathy: Secondary | ICD-10-CM | POA: Diagnosis present

## 2021-07-16 DIAGNOSIS — Z7189 Other specified counseling: Secondary | ICD-10-CM

## 2021-07-16 DIAGNOSIS — J449 Chronic obstructive pulmonary disease, unspecified: Secondary | ICD-10-CM | POA: Diagnosis present

## 2021-07-16 DIAGNOSIS — C787 Secondary malignant neoplasm of liver and intrahepatic bile duct: Secondary | ICD-10-CM | POA: Diagnosis present

## 2021-07-16 DIAGNOSIS — E876 Hypokalemia: Secondary | ICD-10-CM | POA: Diagnosis not present

## 2021-07-16 DIAGNOSIS — J690 Pneumonitis due to inhalation of food and vomit: Secondary | ICD-10-CM | POA: Diagnosis not present

## 2021-07-16 DIAGNOSIS — A419 Sepsis, unspecified organism: Principal | ICD-10-CM | POA: Diagnosis present

## 2021-07-16 DIAGNOSIS — Z87891 Personal history of nicotine dependence: Secondary | ICD-10-CM

## 2021-07-16 DIAGNOSIS — N390 Urinary tract infection, site not specified: Secondary | ICD-10-CM | POA: Diagnosis present

## 2021-07-16 DIAGNOSIS — F319 Bipolar disorder, unspecified: Secondary | ICD-10-CM | POA: Diagnosis present

## 2021-07-16 DIAGNOSIS — N179 Acute kidney failure, unspecified: Secondary | ICD-10-CM | POA: Diagnosis present

## 2021-07-16 DIAGNOSIS — E8809 Other disorders of plasma-protein metabolism, not elsewhere classified: Secondary | ICD-10-CM | POA: Diagnosis present

## 2021-07-16 DIAGNOSIS — Z515 Encounter for palliative care: Secondary | ICD-10-CM

## 2021-07-16 DIAGNOSIS — R64 Cachexia: Secondary | ICD-10-CM | POA: Diagnosis present

## 2021-07-16 DIAGNOSIS — R0602 Shortness of breath: Secondary | ICD-10-CM

## 2021-07-16 DIAGNOSIS — R7982 Elevated C-reactive protein (CRP): Secondary | ICD-10-CM | POA: Diagnosis present

## 2021-07-16 DIAGNOSIS — R6520 Severe sepsis without septic shock: Secondary | ICD-10-CM | POA: Diagnosis present

## 2021-07-16 DIAGNOSIS — R54 Age-related physical debility: Secondary | ICD-10-CM | POA: Diagnosis present

## 2021-07-16 DIAGNOSIS — U071 COVID-19: Secondary | ICD-10-CM | POA: Diagnosis present

## 2021-07-16 DIAGNOSIS — N189 Chronic kidney disease, unspecified: Secondary | ICD-10-CM | POA: Diagnosis present

## 2021-07-16 DIAGNOSIS — C801 Malignant (primary) neoplasm, unspecified: Secondary | ICD-10-CM | POA: Diagnosis present

## 2021-07-16 DIAGNOSIS — E870 Hyperosmolality and hypernatremia: Secondary | ICD-10-CM | POA: Diagnosis present

## 2021-07-16 DIAGNOSIS — Z681 Body mass index (BMI) 19 or less, adult: Secondary | ICD-10-CM | POA: Diagnosis not present

## 2021-07-16 DIAGNOSIS — N184 Chronic kidney disease, stage 4 (severe): Secondary | ICD-10-CM

## 2021-07-16 LAB — URINALYSIS, ROUTINE W REFLEX MICROSCOPIC
Bilirubin Urine: NEGATIVE
Glucose, UA: NEGATIVE mg/dL
Ketones, ur: NEGATIVE mg/dL
Nitrite: POSITIVE — AB
Protein, ur: 30 mg/dL — AB
Specific Gravity, Urine: 1.006 (ref 1.005–1.030)
WBC, UA: >50 WBC/hpf — ABNORMAL HIGH (ref 0–5)
pH: 6 (ref 5.0–8.0)

## 2021-07-16 LAB — AMMONIA: Ammonia: 38 umol/L — ABNORMAL HIGH (ref 9–35)

## 2021-07-16 LAB — CBC WITH DIFFERENTIAL/PLATELET
Abs Immature Granulocytes: 0.45 K/uL — ABNORMAL HIGH (ref 0.00–0.07)
Basophils Absolute: 0 K/uL (ref 0.0–0.1)
Basophils Relative: 0 %
Eosinophils Absolute: 0.1 K/uL (ref 0.0–0.5)
Eosinophils Relative: 0 %
HCT: 32.9 % — ABNORMAL LOW (ref 36.0–46.0)
Hemoglobin: 9.3 g/dL — ABNORMAL LOW (ref 12.0–15.0)
Immature Granulocytes: 3 %
Lymphocytes Relative: 8 %
Lymphs Abs: 1.4 K/uL (ref 0.7–4.0)
MCH: 25.7 pg — ABNORMAL LOW (ref 26.0–34.0)
MCHC: 28.3 g/dL — ABNORMAL LOW (ref 30.0–36.0)
MCV: 90.9 fL (ref 80.0–100.0)
Monocytes Absolute: 1.2 K/uL — ABNORMAL HIGH (ref 0.1–1.0)
Monocytes Relative: 7 %
Neutro Abs: 15 K/uL — ABNORMAL HIGH (ref 1.7–7.7)
Neutrophils Relative %: 82 %
Platelets: 346 K/uL (ref 150–400)
RBC: 3.62 MIL/uL — ABNORMAL LOW (ref 3.87–5.11)
RDW: 18.4 % — ABNORMAL HIGH (ref 11.5–15.5)
WBC: 18.1 K/uL — ABNORMAL HIGH (ref 4.0–10.5)
nRBC: 0.1 % (ref 0.0–0.2)

## 2021-07-16 LAB — LACTIC ACID, PLASMA
Lactic Acid, Venous: 1.9 mmol/L (ref 0.5–1.9)
Lactic Acid, Venous: 2.3 mmol/L (ref 0.5–1.9)

## 2021-07-16 LAB — COMPREHENSIVE METABOLIC PANEL WITH GFR
ALT: 21 U/L (ref 0–44)
AST: 40 U/L (ref 15–41)
Albumin: 2.1 g/dL — ABNORMAL LOW (ref 3.5–5.0)
Alkaline Phosphatase: 349 U/L — ABNORMAL HIGH (ref 38–126)
Anion gap: 17 — ABNORMAL HIGH (ref 5–15)
BUN: 62 mg/dL — ABNORMAL HIGH (ref 8–23)
CO2: 23 mmol/L (ref 22–32)
Calcium: 9 mg/dL (ref 8.9–10.3)
Chloride: 108 mmol/L (ref 98–111)
Creatinine, Ser: 3.79 mg/dL — ABNORMAL HIGH (ref 0.44–1.00)
GFR, Estimated: 13 mL/min — ABNORMAL LOW
Glucose, Bld: 125 mg/dL — ABNORMAL HIGH (ref 70–99)
Potassium: 4.4 mmol/L (ref 3.5–5.1)
Sodium: 148 mmol/L — ABNORMAL HIGH (ref 135–145)
Total Bilirubin: 0.7 mg/dL (ref 0.3–1.2)
Total Protein: 6.4 g/dL — ABNORMAL LOW (ref 6.5–8.1)

## 2021-07-16 LAB — TSH: TSH: 0.022 u[IU]/mL — ABNORMAL LOW (ref 0.350–4.500)

## 2021-07-16 LAB — TROPONIN I (HIGH SENSITIVITY): Troponin I (High Sensitivity): 33 ng/L — ABNORMAL HIGH (ref <18)

## 2021-07-16 LAB — D-DIMER, QUANTITATIVE: D-Dimer, Quant: 7.18 ug/mL-FEU — ABNORMAL HIGH (ref 0.00–0.50)

## 2021-07-16 LAB — RESP PANEL BY RT-PCR (FLU A&B, COVID) ARPGX2
Influenza A by PCR: NEGATIVE
Influenza B by PCR: NEGATIVE
SARS Coronavirus 2 by RT PCR: POSITIVE — AB

## 2021-07-16 LAB — PROTIME-INR
INR: 1.2 (ref 0.8–1.2)
Prothrombin Time: 15.6 s — ABNORMAL HIGH (ref 11.4–15.2)

## 2021-07-16 LAB — CORTISOL: Cortisol, Plasma: 35.3 ug/dL

## 2021-07-16 LAB — FERRITIN: Ferritin: 540 ng/mL — ABNORMAL HIGH (ref 11–307)

## 2021-07-16 LAB — C-REACTIVE PROTEIN: CRP: 17.6 mg/dL — ABNORMAL HIGH (ref <1.0)

## 2021-07-16 LAB — FIBRINOGEN: Fibrinogen: 722 mg/dL — ABNORMAL HIGH (ref 210–475)

## 2021-07-16 LAB — LACTATE DEHYDROGENASE: LDH: 696 U/L — ABNORMAL HIGH (ref 98–192)

## 2021-07-16 LAB — PROCALCITONIN: Procalcitonin: 1.35 ng/mL

## 2021-07-16 MED ORDER — SODIUM CHLORIDE 0.9 % IV SOLN
2.0000 g | INTRAVENOUS | Status: DC
  Administered 2021-07-17 – 2021-07-18 (×2): 2 g via INTRAVENOUS
  Filled 2021-07-16 (×4): qty 20

## 2021-07-16 MED ORDER — SODIUM CHLORIDE 0.9 % IV SOLN
200.0000 mg | Freq: Once | INTRAVENOUS | Status: DC
  Filled 2021-07-16: qty 40

## 2021-07-16 MED ORDER — METRONIDAZOLE 500 MG/100ML IV SOLN
500.0000 mg | Freq: Once | INTRAVENOUS | Status: AC
  Administered 2021-07-16: 500 mg via INTRAVENOUS
  Filled 2021-07-16: qty 100

## 2021-07-16 MED ORDER — VANCOMYCIN HCL IN DEXTROSE 1-5 GM/200ML-% IV SOLN
1000.0000 mg | Freq: Once | INTRAVENOUS | Status: DC
  Filled 2021-07-16: qty 200

## 2021-07-16 MED ORDER — LACTULOSE 10 GM/15ML PO SOLN
20.0000 g | Freq: Every day | ORAL | Status: DC
  Administered 2021-07-17 – 2021-07-19 (×2): 20 g via ORAL
  Filled 2021-07-16 (×4): qty 30

## 2021-07-16 MED ORDER — LACTATED RINGERS IV SOLN: INTRAVENOUS | Status: AC

## 2021-07-16 MED ORDER — LORAZEPAM 1 MG PO TABS
1.0000 mg | ORAL_TABLET | Freq: Two times a day (BID) | ORAL | Status: DC
Start: 2021-07-16 — End: 2021-07-22
  Administered 2021-07-17 – 2021-07-21 (×9): 1 mg via ORAL
  Filled 2021-07-16 (×10): qty 1

## 2021-07-16 MED ORDER — VANCOMYCIN VARIABLE DOSE PER UNSTABLE RENAL FUNCTION (PHARMACIST DOSING): Status: DC

## 2021-07-16 MED ORDER — SODIUM CHLORIDE 0.9 % IV SOLN: 100.0000 mg | Freq: Every day | INTRAVENOUS | Status: DC

## 2021-07-16 MED ORDER — LACTATED RINGERS IV BOLUS (SEPSIS)
1000.0000 mL | Freq: Once | INTRAVENOUS | Status: AC
  Administered 2021-07-16: 1000 mL via INTRAVENOUS

## 2021-07-16 MED ORDER — SODIUM CHLORIDE 0.9 % IV SOLN: 2.0000 g | INTRAVENOUS | Status: DC

## 2021-07-16 MED ORDER — VANCOMYCIN HCL 1250 MG/250ML IV SOLN
1250.0000 mg | Freq: Once | INTRAVENOUS | Status: AC
  Administered 2021-07-16: 1250 mg via INTRAVENOUS
  Filled 2021-07-16: qty 250

## 2021-07-16 MED ORDER — TAMSULOSIN HCL 0.4 MG PO CAPS
0.4000 mg | ORAL_CAPSULE | Freq: Every day | ORAL | Status: DC
  Administered 2021-07-17 – 2021-07-19 (×2): 0.4 mg via ORAL
  Filled 2021-07-16 (×4): qty 1

## 2021-07-16 MED ORDER — FERROUS SULFATE 325 (65 FE) MG PO TABS
325.0000 mg | ORAL_TABLET | Freq: Every day | ORAL | Status: DC
Start: 2021-07-17 — End: 2021-07-22
  Administered 2021-07-17 – 2021-07-21 (×4): 325 mg via ORAL
  Filled 2021-07-16 (×5): qty 1

## 2021-07-16 MED ORDER — HEPARIN SODIUM (PORCINE) 5000 UNIT/ML IJ SOLN
5000.0000 [IU] | Freq: Three times a day (TID) | INTRAMUSCULAR | Status: DC
  Administered 2021-07-16 – 2021-07-22 (×15): 5000 [IU] via SUBCUTANEOUS
  Filled 2021-07-16 (×15): qty 1

## 2021-07-16 MED ORDER — SODIUM CHLORIDE 0.9 % IV SOLN
2.0000 g | Freq: Once | INTRAVENOUS | Status: AC
  Administered 2021-07-16: 2 g via INTRAVENOUS
  Filled 2021-07-16: qty 2

## 2021-07-16 MED ORDER — LAMOTRIGINE 100 MG PO TABS
200.0000 mg | ORAL_TABLET | Freq: Every day | ORAL | Status: DC
Start: 2021-07-16 — End: 2021-07-27
  Administered 2021-07-17 – 2021-07-21 (×4): 200 mg via ORAL
  Filled 2021-07-16 (×9): qty 2

## 2021-07-16 MED ORDER — OLANZAPINE 10 MG PO TABS
10.0000 mg | ORAL_TABLET | Freq: Two times a day (BID) | ORAL | Status: DC
  Administered 2021-07-16 – 2021-07-19 (×7): 10 mg via ORAL
  Filled 2021-07-16 (×8): qty 1

## 2021-07-16 MED ORDER — LORATADINE 10 MG PO TABS
10.0000 mg | ORAL_TABLET | Freq: Every day | ORAL | Status: DC
  Administered 2021-07-17 – 2021-07-21 (×4): 10 mg via ORAL
  Filled 2021-07-16 (×5): qty 1

## 2021-07-16 NOTE — Progress Notes (Signed)
Pharmacy Antibiotic Note  Megan Miranda is a 61 y.o. female admitted on 07/03/2021 with sepsis.  Pharmacy has been consulted for vancomycin and cefepime dosing. Tmax 100.1. WBC 18.1. Scr 3.79 - Baseline ~1.5 - 2.   Plan: Vancomycin 1250mg  x1 loading dose  Vancomycin random level dosing due to renal function  Obtain vancomycin random level as appropriate Cefepime 2g x1 followed by cefepime 2g q24h  Monitor renal function, cultures, vancomycin levels, and clinical progress   Height: 5\' 5"  (165.1 cm) Weight: 57.2 kg (126 lb) IBW/kg (Calculated) : 57  Temp (24hrs), Avg:100.1 F (37.8 C), Min:100.1 F (37.8 C), Max:100.1 F (37.8 C)  Recent Labs  Lab 07/15/2021 1229  WBC 18.1*    CrCl cannot be calculated (Patient's most recent lab result is older than the maximum 21 days allowed.).    Allergies  Allergen Reactions   Bee Venom Itching   Soap Rash    Antimicrobials this admission: Vancomycin 2/22 >> Cefepime 2/22 >>  Dose adjustments this admission:   Microbiology results: 2/22 bcx: 2/22 ucx:    Thank you for allowing pharmacy to be a part of this patients care.  Cristela Felt, PharmD, BCPS Clinical Pharmacist 07/13/2021 12:55 PM

## 2021-07-16 NOTE — Progress Notes (Addendum)
Manufacturing engineer [facility]) Hospital Liaison Note  Referral received for patient/family interest in [facility]. Chart is under review by [facility] physician.   Patient has tested positive for COVID. Unfortunately, we can not take her at [facility] until she has completed her 10 day isolation period. I have updated her legal guardian [name].   Please call with any questions or concerns. Thank you    Roselee Nova, [place] Hospital Liaison  [phone]

## 2021-07-16 NOTE — ED Provider Notes (Addendum)
[facility] EMERGENCY DEPARTMENT Provider Note   CSN: [id number] Arrival date & time: 07/18/2021  1208     History  Chief Complaint  Patient presents with   Altered Mental Status    Megan Miranda is a 61 y.o. female.  Patient brought in by EMS from a [facility].  Brought in for increased altered mental status.  Lethargy they said it really just occurred today.  But patient's husband states is gotten worse over the past week.  Patient arrives nonverbal able to follow some commands.  Vital signs on as per EMS was a systolic pressure of 703 heart rate of 130 oxygen sats 96% blood sugar 135.  But upon arrival here are vital signs was a heart rate of 133 blood pressure 83/55 oxygen sats 97% on room air.  Chart review shows that patient has a significant psychiatric history followed by behavioral health frequently bipolar disorder and schizoaffective disorder.      Home Medications Prior to Admission medications   Medication Sig Start Date End Date Taking? Authorizing Provider  ferrous sulfate 325 (65 FE) MG tablet Take 325 mg by mouth daily.   Yes [provider]  lactulose (CHRONULAC) 10 GM/15ML solution Take 30 mLs (20 g total) by mouth daily. 05/24/21  Yes Regalado, Belkys A, MD  lamoTRIgine (LAMICTAL) 200 MG tablet Take 1 tablet (200 mg total) by mouth daily. 04/14/18  Yes Plovsky, Berneta Sages, MD  loratadine (CLARITIN) 10 MG tablet Take 10 mg by mouth daily.   Yes [provider]  LORazepam (ATIVAN) 1 MG tablet Take 1 tablet (1 mg total) by mouth 2 (two) times daily. 05/23/21  Yes Regalado, Belkys A, MD  OLANZapine (ZYPREXA) 20 MG tablet Take 0.5 tablets (10 mg total) by mouth 2 (two) times daily. Half tablet by mouth twice daily 05/23/21  Yes Regalado, Belkys A, MD  tamsulosin (FLOMAX) 0.4 MG CAPS capsule Take 1 capsule (0.4 mg total) by mouth daily. 05/24/21  Yes Regalado, Belkys A, MD      Allergies    Bee venom and Soap    Review of Systems    Review of Systems  Unable to perform ROS: Mental status change   Physical Exam Updated Vital Signs BP (!) 106/59    Pulse (!) 118    Temp 100.1 F (37.8 C) (Rectal)    Resp 18    Ht 1.651 m (_0 )    Wt 57.2 kg    SpO2 97%    BMI 20.97 kg/m  Physical Exam Vitals and nursing note reviewed.  Constitutional:      General: She is in acute distress.     Appearance: Normal appearance. She is well-developed. She is ill-appearing.     Comments: Cachectic.  HENT:     Head: Normocephalic and atraumatic.     Mouth/Throat:     Mouth: Mucous membranes are dry.  Eyes:     Extraocular Movements: Extraocular movements intact.     Conjunctiva/sclera: Conjunctivae normal.     Pupils: Pupils are equal, round, and reactive to light.  Cardiovascular:     Rate and Rhythm: Regular rhythm. Tachycardia present.     Heart sounds: No murmur heard. Pulmonary:     Effort: Respiratory distress present.     Breath sounds: Normal breath sounds.  Abdominal:     Palpations: Abdomen is soft.     Tenderness: There is no abdominal tenderness.  Musculoskeletal:        General: No swelling.  Cervical back: Normal range of motion and neck supple.     Right lower leg: No edema.     Left lower leg: No edema.  Skin:    General: Skin is warm and dry.     Capillary Refill: Capillary refill takes less than 2 seconds.  Neurological:     Mental Status: She is alert.     Comments: Patient nonverbal.  But will move her arms.  Psychiatric:        Mood and Affect: Mood normal.    ED Results / Procedures / Treatments   Labs (all labs ordered are listed, but only abnormal results are displayed) Labs Reviewed  RESP PANEL BY RT-PCR (FLU A&B, COVID) ARPGX2 - Abnormal; Notable for the following components:      Result Value   SARS Coronavirus 2 by RT PCR POSITIVE (*)    All other components within normal limits  COMPREHENSIVE METABOLIC PANEL - Abnormal; Notable for the following components:   Sodium 148 (*)     Glucose, Bld 125 (*)    BUN 62 (*)    Creatinine, Ser 3.79 (*)    Total Protein 6.4 (*)    Albumin 2.1 (*)    Alkaline Phosphatase 349 (*)    GFR, Estimated 13 (*)    Anion gap 17 (*)    All other components within normal limits  CBC WITH DIFFERENTIAL/PLATELET - Abnormal; Notable for the following components:   WBC 18.1 (*)    RBC 3.62 (*)    Hemoglobin 9.3 (*)    HCT 32.9 (*)    MCH 25.7 (*)    MCHC 28.3 (*)    RDW 18.4 (*)    Neutro Abs 15.0 (*)    Monocytes Absolute 1.2 (*)    Abs Immature Granulocytes 0.45 (*)    All other components within normal limits  PROTIME-INR - Abnormal; Notable for the following components:   Prothrombin Time 15.6 (*)    All other components within normal limits  CULTURE, BLOOD (ROUTINE X 2)  CULTURE, BLOOD (ROUTINE X 2)  URINE CULTURE  LACTIC ACID, PLASMA  LACTIC ACID, PLASMA  URINALYSIS, ROUTINE W REFLEX MICROSCOPIC  APTT    EKG EKG Interpretation  Date/Time:  Wednesday July 16 2021 12:16:25 EST Ventricular Rate:  133 PR Interval:  132 QRS Duration: 91 QT Interval:  284 QTC Calculation: 423 R Axis:   -36 Text Interpretation: Sinus tachycardia Consider right atrial enlargement Left axis deviation Nonspecific T abnormalities, lateral leads No significant change since last tracing Confirmed by Fredia Sorrow [id number]) on 07/04/2021 12:47:20 PM  Radiology CT Head Wo Contrast  Result Date: 07/01/2021 CLINICAL DATA:  Altered mental status. EXAM: CT HEAD WITHOUT CONTRAST TECHNIQUE: Contiguous axial images were obtained from the base of the skull through the vertex without intravenous contrast. RADIATION DOSE REDUCTION: This exam was performed according to the departmental dose-optimization program which includes automated exposure control, adjustment of the mA and/or kV according to patient size and/or use of iterative reconstruction technique. COMPARISON:  CT head dated 06/23/2021. FINDINGS: Brain: No evidence of acute infarction,  hemorrhage, hydrocephalus, extra-axial collection or mass lesion/mass effect. There is mild to moderate cerebral volume loss with associated ex vacuo dilatation. Periventricular white matter hypoattenuation likely represents chronic small vessel ischemic disease. Vascular: There are vascular calcifications in the carotid siphons. Skull: Normal. Negative for fracture or focal lesion. Sinuses/Orbits: No acute finding. Other: None. IMPRESSION: No acute intracranial process. Electronically Signed   By: Harley Hallmark.D.  On: 07/12/2021 13:39   DG Chest Port 1 View  Result Date: 07/09/2021 CLINICAL DATA:  Provided history: Questionable sepsis. EXAM: PORTABLE CHEST 1 VIEW COMPARISON:  Prior chest radiographs 06/23/2021 and earlier. FINDINGS: A small portion of the right lung apex may be excluded from the field of view. Heart size within normal limits. No appreciable airspace consolidation. No evidence of pleural effusion or pneumothorax. No acute bony abnormality identified. Thoracic dextrocurvature. IMPRESSION: A small portion of the right lung apex may be excluded from the field of view. No evidence of acute cardiopulmonary abnormality. Electronically Signed   By: Kellie Simmering D.O.   On: 06/30/2021 13:13    Procedures Procedures    Medications Ordered in ED Medications  lactated ringers infusion ( Intravenous New Bag/Given 07/17/2021 1259)  vancomycin variable dose per unstable renal function (pharmacist dosing) (has no administration in time range)  ceFEPIme (MAXIPIME) 2 g in sodium chloride 0.9 % 100 mL IVPB (has no administration in time range)  lactated ringers bolus 1,000 mL (0 mLs Intravenous Stopped 06/28/2021 1438)    And  lactated ringers bolus 1,000 mL (0 mLs Intravenous Stopped 07/10/2021 1438)  ceFEPIme (MAXIPIME) 2 g in sodium chloride 0.9 % 100 mL IVPB (0 g Intravenous Stopped 07/01/2021 1341)  metroNIDAZOLE (FLAGYL) IVPB 500 mg (0 mg Intravenous Stopped 07/17/2021 1438)  vancomycin (VANCOREADY)  IVPB 1250 mg/250 mL (1,250 mg Intravenous New Bag/Given 07/04/2021 1348)    ED Course/ Medical Decision Making/ A&P                           Medical Decision Making Amount and/or Complexity of Data Reviewed Labs: ordered. Radiology: ordered. ECG/medicine tests: ordered.  Risk Prescription drug management. Decision regarding hospitalization.   CRITICAL CARE Performed by: Fredia Sorrow Total critical care time: 45 minutes Critical care time was exclusive of separately billable procedures and treating other patients. Critical care was necessary to treat or prevent imminent or life-threatening deterioration. Critical care was time spent personally by me on the following activities: development of treatment plan with patient and/or surrogate as well as nursing, discussions with consultants, evaluation of patient's response to treatment, examination of patient, obtaining history from patient or surrogate, ordering and performing treatments and interventions, ordering and review of laboratory studies, ordering and review of radiographic studies, pulse oximetry and re-evaluation of patient's condition.  As chart review shows that during the admission the end of December patient was a DNR.  Social worker also was contacted saying that there was plans to move patient to hospice care.  Patient clearly met sepsis criteria upon arrival here.  Patient started on sepsis criteria for 30 cc/kg fluid challenge.  And broad-spectrum antibiotics.  COVID test sent and coming back positive.  Influenza negative.  Marked leukocytosis however though with a white count of 18,000 hemoglobin 9.3.  Complete metabolic panel GFR of 13 so consistent with acute kidney injury.  But potassium is normal at 4.4.  Head CT without any acute process.  Chest x-ray no acute cardiopulmonary findings.  Patient followed by [facility] care in the past.  We will get hospitalist to admit.  Patient's vital signs have improved  significantly blood pressures are now in the upper 90s or low 100s.  Patient is also a little bit more alert.  No hypoxia.  In addition patient's GFR's have been low for a period of time.  So this is not a significant change.  She clearly has stage IV chronic  kidney disease.  Final Clinical Impression(s) / ED Diagnoses Final diagnoses:  Sepsis, due to unspecified organism, unspecified whether acute organ dysfunction present ([place])  COVID  Altered mental status, unspecified altered mental status type  Stage 4 chronic kidney disease [facility])    Rx / DC Orders ED Discharge Orders     None         Fredia Sorrow, MD 07/05/2021 1535    Fredia Sorrow, MD 07/08/2021 1537    Fredia Sorrow, MD 06/28/2021 [id number]

## 2021-07-16 NOTE — Plan of Care (Signed)
  Problem: Clinical Measurements: Goal: Ability to maintain clinical measurements within normal limits will improve Outcome: Progressing Goal: Respiratory complications will improve Outcome: Progressing Goal: Cardiovascular complication will be avoided Outcome: Progressing   

## 2021-07-16 NOTE — ED Notes (Signed)
Report handed off to [employer] via secure chat. Will transport patient to [place].

## 2021-07-16 NOTE — Progress Notes (Signed)
°   06/26/2021 2123  Assess: MEWS Score  Temp 98.6 F (37 C)  BP 94/62  Pulse Rate (!) 130  ECG Heart Rate (!) 127  Resp 19  Level of Consciousness Responds to Voice  SpO2 99 %  O2 Device Room Air  Assess: MEWS Score  MEWS Temp 0  MEWS Systolic 1  MEWS Pulse 2  MEWS RR 0  MEWS LOC 1  MEWS Score 4  MEWS Score Color Red  Assess: if the MEWS score is Yellow or Red  Were vital signs taken at a resting state? Yes  Focused Assessment No change from prior assessment  Early Detection of Sepsis Score *See Row Information* Medium  MEWS guidelines implemented *See Row Information* Yes  Treat  MEWS Interventions Escalated (See documentation below)  Pain Scale PAINAD  Breathing 0  Negative Vocalization 0  Facial Expression 0  Body Language 0  Consolability 0  PAINAD Score 0  Take Vital Signs  Increase Vital Sign Frequency  Red: Q 1hr X 4 then Q 4hr X 4, if remains red, continue Q 4hrs  Escalate  MEWS: Escalate Red: discuss with charge nurse/RN and provider, consider discussing with RRT  Notify: Charge Nurse/RN  Name of Charge Nurse/RN Notified J. Rogers Blocker  Date Charge Nurse/RN Notified 07/15/2021  Time Charge Nurse/RN Notified 2300  Notify: Provider  Provider Name/Title Dr. Roosevelt Locks aware at time of transfer  Date Provider Notified 07/12/2021  Notification Type Call  Notify: Rapid Response  Name of Rapid Response RN Notified Mindy  Date Rapid Response Notified 07/19/2021  Time Rapid Response Notified 0277  Document  Patient Outcome Stabilized after interventions  Progress note created (see row info) Yes

## 2021-07-16 NOTE — ED Notes (Signed)
This RN unable to obtain ordered lactic acid and troponin. Phlebotomy notified and will come draw labs for patient.

## 2021-07-16 NOTE — H&P (Signed)
History and Physical    [name]:[id number] DOB: 1960/11/24 DOA: 07/09/2021  PCP: Patient, No Pcp Per (Inactive) (Confirm with patient/family/NH records and if not entered, this has to be entered at [facility] point of entry) Patient coming from: Nursing home  I have personally briefly reviewed patient's old medical records in [place]  Chief Complaint: Patient confused  HPI: Megan Miranda is a 61 y.o. female with medical history significant of bipolar disorder, 6 affective disorder, CKD stage IIIb, recently found liver lesion suspicious for metastatic with unknown origin, chronic normocytic anemia, was sent from nursing home for altered mentations.  Patient unable to answer any questions, most history collected by reviewing ED chart and talking patient's brother/POA.  Patient was last seen at her baseline few days ago, since then appears that patient has had decreased oral intake.  Today patient was sent from nursing home for evaluation of confusion and less responsive.  According to POA, there was a plan for patient to go to hospice since last admission for the reason patient's overall conditions have deteriorated recently with new liver lesions suspicious for metastasis and patient has refused biopsy.  However, patient's physical and mental status remained stable during nursing home stay and family continue to remain hopeful that the patient can continue to improve so hospice was never started.  ED Course: Patient was found to be hypotensive and tachycardia HR 130s, BP 92/52.  Blood pressure was stabilized with 2 L of IV boluses.  COVID positive.  UA appears to be a UTI compatible.  WBC 18.1, hemoglobin 9.3, creatinine 3.79, sodium 148 BUN 62  Patient received vancomycin cefepime and Flagyl in the ED.  And IV boluses and continuous LR.  Review of Systems: Unable to perform, patient confused.   Past Medical History:  Diagnosis Date   Bipolar disorder ([place])    Schizoaffective  disorder ([place])     No past surgical history on file.   reports that she has been smoking cigarettes. She has a 0.20 pack-year smoking history. She has never used smokeless tobacco. She reports that she does not drink alcohol and does not use drugs.  Allergies  Allergen Reactions   Bee Venom Itching   Soap Rash    No family history on file.   Prior to Admission medications   Medication Sig Start Date End Date Taking? Authorizing Provider  ferrous sulfate 325 (65 FE) MG tablet Take 325 mg by mouth daily.   Yes [provider]  lactulose (CHRONULAC) 10 GM/15ML solution Take 30 mLs (20 g total) by mouth daily. 05/24/21  Yes Regalado, Belkys A, MD  lamoTRIgine (LAMICTAL) 200 MG tablet Take 1 tablet (200 mg total) by mouth daily. 04/14/18  Yes Plovsky, Berneta Sages, MD  loratadine (CLARITIN) 10 MG tablet Take 10 mg by mouth daily.   Yes [provider]  LORazepam (ATIVAN) 1 MG tablet Take 1 tablet (1 mg total) by mouth 2 (two) times daily. 05/23/21  Yes Regalado, Belkys A, MD  OLANZapine (ZYPREXA) 20 MG tablet Take 0.5 tablets (10 mg total) by mouth 2 (two) times daily. Half tablet by mouth twice daily 05/23/21  Yes Regalado, Belkys A, MD  tamsulosin (FLOMAX) 0.4 MG CAPS capsule Take 1 capsule (0.4 mg total) by mouth daily. 05/24/21  Yes Elmarie Shiley, MD    Physical Exam: Vitals:   07/18/2021 1500 07/18/2021 1515 07/06/2021 1600 07/14/2021 1630  BP: 98/66 105/65 (!) 86/73 94/67  Pulse: (!) 115 (!) 119 (!) 113 Marland Kitchen)  119  Resp: 19 (!) 23 20 (!) 24  Temp:    98.1 F (36.7 C)  TempSrc:    Oral  SpO2: 94% 99% 98% 99%  Weight:      Height:        Constitutional: NAD, calm, comfortable Vitals:   07/18/2021 1500 07/05/2021 1515 07/02/2021 1600 07/08/2021 1630  BP: 98/66 105/65 (!) 86/73 94/67  Pulse: (!) 115 (!) 119 (!) 113 (!) 119  Resp: 19 (!) 23 20 (!) 24  Temp:    98.1 F (36.7 C)  TempSrc:    Oral  SpO2: 94% 99% 98% 99%  Weight:      Height:       Eyes: PERRL, lids and  conjunctivae normal ENMT: Mucous membranes are dry. Posterior pharynx clear of any exudate or lesions.Normal dentition.  Neck: normal, supple, no masses, no thyromegaly Respiratory: clear to auscultation bilaterally, no wheezing, no crackles. Normal respiratory effort. No accessory muscle use.  Cardiovascular: Regular rate and rhythm, no murmurs / rubs / gallops. No extremity edema. 2+ pedal pulses. No carotid bruits.  Abdomen: no tenderness, no masses palpated. No hepatosplenomegaly. Bowel sounds positive.  Musculoskeletal: no clubbing / cyanosis. No joint deformity upper and lower extremities. Good ROM, no contractures. Normal muscle tone.  Skin: no rashes, lesions, ulcers. No induration Neurologic: No facial droops, moving all limbs, not following command. Psychiatric: Awake, confused   Labs on Admission: I have personally reviewed following labs and imaging studies  CBC: Recent Labs  Lab 07/10/2021 1229  WBC 18.1*  NEUTROABS 15.0*  HGB 9.3*  HCT 32.9*  MCV 90.9  PLT 161   Basic Metabolic Panel: Recent Labs  Lab 06/30/2021 1229  NA 148*  K 4.4  CL 108  CO2 23  GLUCOSE 125*  BUN 62*  CREATININE 3.79*  CALCIUM 9.0   GFR: Estimated Creatinine Clearance: 14 mL/min (A) (by C-G formula based on SCr of 3.79 mg/dL (H)). Liver Function Tests: Recent Labs  Lab 07/10/2021 1229  AST 40  ALT 21  ALKPHOS 349*  BILITOT 0.7  PROT 6.4*  ALBUMIN 2.1*   No results for input(s): LIPASE, AMYLASE in the last 168 hours. No results for input(s): AMMONIA in the last 168 hours. Coagulation Profile: Recent Labs  Lab 07/19/2021 1229  INR 1.2   Cardiac Enzymes: No results for input(s): CKTOTAL, CKMB, CKMBINDEX, TROPONINI in the last 168 hours. BNP (last 3 results) No results for input(s): PROBNP in the last 8760 hours. HbA1C: No results for input(s): HGBA1C in the last 72 hours. CBG: No results for input(s): GLUCAP in the last 168 hours. Lipid Profile: No results for input(s): CHOL,  HDL, LDLCALC, TRIG, CHOLHDL, LDLDIRECT in the last 72 hours. Thyroid Function Tests: No results for input(s): TSH, T4TOTAL, FREET4, T3FREE, THYROIDAB in the last 72 hours. Anemia Panel: No results for input(s): VITAMINB12, FOLATE, FERRITIN, TIBC, IRON, RETICCTPCT in the last 72 hours. Urine analysis:    Component Value Date/Time   COLORURINE YELLOW 07/02/2021 1629   APPEARANCEUR HAZY (A) 07/02/2021 1629   LABSPEC 1.006 07/11/2021 1629   PHURINE 6.0 06/28/2021 1629   GLUCOSEU NEGATIVE 07/06/2021 1629   HGBUR SMALL (A) 07/07/2021 1629   BILIRUBINUR NEGATIVE 07/19/2021 1629   KETONESUR NEGATIVE 07/13/2021 1629   PROTEINUR 30 (A) 07/15/2021 1629   NITRITE POSITIVE (A) 07/06/2021 1629   LEUKOCYTESUR LARGE (A) 06/30/2021 1629    Radiological Exams on Admission: CT Head Wo Contrast  Result Date: 07/15/2021 CLINICAL DATA:  Altered mental status. EXAM:  CT HEAD WITHOUT CONTRAST TECHNIQUE: Contiguous axial images were obtained from the base of the skull through the vertex without intravenous contrast. RADIATION DOSE REDUCTION: This exam was performed according to the departmental dose-optimization program which includes automated exposure control, adjustment of the mA and/or kV according to patient size and/or use of iterative reconstruction technique. COMPARISON:  CT head dated 06/23/2021. FINDINGS: Brain: No evidence of acute infarction, hemorrhage, hydrocephalus, extra-axial collection or mass lesion/mass effect. There is mild to moderate cerebral volume loss with associated ex vacuo dilatation. Periventricular white matter hypoattenuation likely represents chronic small vessel ischemic disease. Vascular: There are vascular calcifications in the carotid siphons. Skull: Normal. Negative for fracture or focal lesion. Sinuses/Orbits: No acute finding. Other: None. IMPRESSION: No acute intracranial process. Electronically Signed   By: Zerita Boers M.D.   On: 07/19/2021 13:39   DG Chest Port 1  View  Result Date: 06/25/2021 CLINICAL DATA:  Provided history: Questionable sepsis. EXAM: PORTABLE CHEST 1 VIEW COMPARISON:  Prior chest radiographs 06/23/2021 and earlier. FINDINGS: A small portion of the right lung apex may be excluded from the field of view. Heart size within normal limits. No appreciable airspace consolidation. No evidence of pleural effusion or pneumothorax. No acute bony abnormality identified. Thoracic dextrocurvature. IMPRESSION: A small portion of the right lung apex may be excluded from the field of view. No evidence of acute cardiopulmonary abnormality. Electronically Signed   By: Kellie Simmering D.O.   On: 07/03/2021 13:13    EKG: Independently reviewed. Sinus Tachy  Assessment/Plan Principal Problem:   Sepsis ([place])  (please populate well all problems here in Problem List. (For example, if patient is on BP meds at home and you resume or decide to hold them, it is a problem that needs to be her. Same for CAD, COPD, HLD and so on)  Sepsis -Evidenced by tachycardia, hypotension, and signs of endorgan damage with acute metabolic encephalopathy.  Blood pressure responded to IV fluid heart rate also improved. -Source of infection, likely UTI, doubt COVID given no significant respiratory symptoms or significant x-ray changes.  De-escalate antibiotics to ceftriaxone, hold off remdesivir and steroid.  Explained to patient's brother over the phone, who expressed understanding and agreed.  Acute metabolic encephalopathy -Treat sepsis then reevaluate -Cussed with POA/brother regarding the treatment plan and hospice plan, family expressed that if patient's condition continues to improve, family request continue to postpone starting hospice.  Otherwise, we will consider consult hospice. -Other Ddx, uremia has been stable, will check ammonium level.  Hypernatremia -Free water deficit= 700 mL, given the patient still has signs of dehydration, plans to continue LR at current rate  reevaluate tomorrow.  COVID infection -Check COVID labs, monitor off remdesivir and steroid as above.  CKD stage IIIb -Creatinine level stable, has symptoms and signs of dehydration, continue IV fluid.  Hypoalbuminemia -No significant edema, on IV fluid.  Liver lesion/metastatic lesion -Unknown origin -Outpatient oncology follow-up.  Patient refused biopsy on last admission.  Schizoaffective disorder/bipolar disorder -Continue lamotrigine and antipsychotic  DVT prophylaxis: Heparin subcu Code Status: DNR Family Communication: Brother/POA on the phone Disposition Plan: Patient is sick with sepsis, expect more than 2 midnight hospital stay. Consults called: None Admission status: PCU   Lequita Halt MD [facility] Pager [phone]  07/17/2021, 5:09 PM

## 2021-07-16 NOTE — ED Notes (Signed)
Admitting provider sent secure chat in regards to patient's current BP and HR.

## 2021-07-16 NOTE — ED Notes (Signed)
Provider made aware of patient's low BP at this time.

## 2021-07-16 NOTE — Sepsis Progress Note (Signed)
Code Sepsis Protocol being monitored by eLink. 

## 2021-07-16 NOTE — ED Triage Notes (Signed)
Pt bib [facility] from [facility] for increased altered mental status. Staff at facility states pt has been altered since this morning but per husband she has gotten worse over this week. Pt arrives non verbal and able to follow some commands.  EMS vitals: 150/120, 130HR, 30R, 96%RA,135CBG

## 2021-07-17 DIAGNOSIS — N1832 Chronic kidney disease, stage 3b: Secondary | ICD-10-CM | POA: Diagnosis not present

## 2021-07-17 DIAGNOSIS — N189 Chronic kidney disease, unspecified: Secondary | ICD-10-CM | POA: Diagnosis present

## 2021-07-17 DIAGNOSIS — N179 Acute kidney failure, unspecified: Secondary | ICD-10-CM

## 2021-07-17 DIAGNOSIS — E870 Hyperosmolality and hypernatremia: Secondary | ICD-10-CM | POA: Diagnosis not present

## 2021-07-17 DIAGNOSIS — A419 Sepsis, unspecified organism: Secondary | ICD-10-CM | POA: Diagnosis not present

## 2021-07-17 DIAGNOSIS — D631 Anemia in chronic kidney disease: Secondary | ICD-10-CM

## 2021-07-17 LAB — URINE CULTURE: Culture: <10000 — AB

## 2021-07-17 LAB — CBC
HCT: 27.2 % — ABNORMAL LOW (ref 36.0–46.0)
HCT: 26.7 % — ABNORMAL LOW (ref 36.0–46.0)
Hemoglobin: 7.7 g/dL — ABNORMAL LOW (ref 12.0–15.0)
Hemoglobin: 7.5 g/dL — ABNORMAL LOW (ref 12.0–15.0)
MCH: 26.1 pg (ref 26.0–34.0)
MCH: 26.1 pg (ref 26.0–34.0)
MCHC: 28.3 g/dL — ABNORMAL LOW (ref 30.0–36.0)
MCHC: 28.1 g/dL — ABNORMAL LOW (ref 30.0–36.0)
MCV: 92.2 fL (ref 80.0–100.0)
MCV: 93 fL (ref 80.0–100.0)
Platelets: 241 10*3/uL (ref 150–400)
Platelets: 225 10*3/uL (ref 150–400)
RBC: 2.95 MIL/uL — ABNORMAL LOW (ref 3.87–5.11)
RBC: 2.87 MIL/uL — ABNORMAL LOW (ref 3.87–5.11)
RDW: 18.6 % — ABNORMAL HIGH (ref 11.5–15.5)
RDW: 18.6 % — ABNORMAL HIGH (ref 11.5–15.5)
WBC: 13.2 10*3/uL — ABNORMAL HIGH (ref 4.0–10.5)
WBC: 13.1 10*3/uL — ABNORMAL HIGH (ref 4.0–10.5)
nRBC: 0 % (ref 0.0–0.2)
nRBC: 0 % (ref 0.0–0.2)

## 2021-07-17 LAB — COMPREHENSIVE METABOLIC PANEL
ALT: 20 U/L (ref 0–44)
AST: 33 U/L (ref 15–41)
Albumin: 1.7 g/dL — ABNORMAL LOW (ref 3.5–5.0)
Alkaline Phosphatase: 267 U/L — ABNORMAL HIGH (ref 38–126)
Anion gap: 14 (ref 5–15)
BUN: 53 mg/dL — ABNORMAL HIGH (ref 8–23)
CO2: 25 mmol/L (ref 22–32)
Calcium: 9.1 mg/dL (ref 8.9–10.3)
Chloride: 119 mmol/L — ABNORMAL HIGH (ref 98–111)
Creatinine, Ser: 3.21 mg/dL — ABNORMAL HIGH (ref 0.44–1.00)
GFR, Estimated: 16 mL/min — ABNORMAL LOW (ref >60)
Glucose, Bld: 96 mg/dL (ref 70–99)
Potassium: 4.6 mmol/L (ref 3.5–5.1)
Sodium: 158 mmol/L — ABNORMAL HIGH (ref 135–145)
Total Bilirubin: 0.7 mg/dL (ref 0.3–1.2)
Total Protein: 5.4 g/dL — ABNORMAL LOW (ref 6.5–8.1)

## 2021-07-17 LAB — BASIC METABOLIC PANEL
Anion gap: 17 — ABNORMAL HIGH (ref 5–15)
BUN: 48 mg/dL — ABNORMAL HIGH (ref 8–23)
CO2: 24 mmol/L (ref 22–32)
Calcium: 8.7 mg/dL — ABNORMAL LOW (ref 8.9–10.3)
Chloride: 121 mmol/L — ABNORMAL HIGH (ref 98–111)
Creatinine, Ser: 2.9 mg/dL — ABNORMAL HIGH (ref 0.44–1.00)
GFR, Estimated: 18 mL/min — ABNORMAL LOW (ref >60)
Glucose, Bld: 84 mg/dL (ref 70–99)
Potassium: 3.8 mmol/L (ref 3.5–5.1)
Sodium: 162 mmol/L (ref 135–145)

## 2021-07-17 LAB — MAGNESIUM: Magnesium: 2.7 mg/dL — ABNORMAL HIGH (ref 1.7–2.4)

## 2021-07-17 LAB — FERRITIN: Ferritin: 546 ng/mL — ABNORMAL HIGH (ref 11–307)

## 2021-07-17 LAB — MRSA NEXT GEN BY PCR, NASAL: MRSA by PCR Next Gen: NOT DETECTED

## 2021-07-17 LAB — TROPONIN I (HIGH SENSITIVITY): Troponin I (High Sensitivity): 35 ng/L — ABNORMAL HIGH (ref <18)

## 2021-07-17 LAB — D-DIMER, QUANTITATIVE: D-Dimer, Quant: 7.41 ug/mL-FEU — ABNORMAL HIGH (ref 0.00–0.50)

## 2021-07-17 LAB — LACTIC ACID, PLASMA
Lactic Acid, Venous: 2.7 mmol/L (ref 0.5–1.9)
Lactic Acid, Venous: 2.2 mmol/L (ref 0.5–1.9)
Lactic Acid, Venous: 1.4 mmol/L (ref 0.5–1.9)

## 2021-07-17 LAB — PHOSPHORUS: Phosphorus: 4.9 mg/dL — ABNORMAL HIGH (ref 2.5–4.6)

## 2021-07-17 LAB — C-REACTIVE PROTEIN: CRP: 17.5 mg/dL — ABNORMAL HIGH (ref <1.0)

## 2021-07-17 MED ORDER — SODIUM CHLORIDE 0.45 % IV SOLN: INTRAVENOUS | Status: DC

## 2021-07-17 MED ORDER — LACTATED RINGERS IV SOLN
INTRAVENOUS | Status: DC
  Administered 2021-07-17: 125 mL via INTRAVENOUS

## 2021-07-17 NOTE — Assessment & Plan Note (Addendum)
Continue Lamictal.  Have stopped Zyprexa as the side effect of hypotension which does seem to have helped slightly

## 2021-07-17 NOTE — Assessment & Plan Note (Addendum)
Minimal improvement with IV fluids with half-normal saline.  Responded to D5W, now normal saline.  Creatinine as low as 2.08 and has slowly started to increase again.

## 2021-07-17 NOTE — Assessment & Plan Note (Addendum)
Due to dehydration and poor p.o. intake.  Despite half-normal saline, so changed over to D5W and this time, sodium starting to improve.  Sodium is finally normalized by 2/25 and fluids changed over to normal saline and sodium has started to trend upward quickly

## 2021-07-17 NOTE — Hospital Course (Addendum)
61 year old female with past medical history of bipolar disorder, stage IIIb chronic kidney disease, recently found liver lesion suspicious for metastatic disease of unknown origin sent over from her skilled nursing facility on 07/21/2021 for increased confusion and admitted for sepsis secondary to UTI as well as patient also found to have COVID.  Started on IV fluids, antibiotics and close monitoring.  Following hospitalization, patient has responded to antibiotics.  She has become more alert although remains hypernatremic, so being treated with D5W.  By 2/25, white blood cell count, creatinine, sodium and procalcitonin all improving although lactic acid level continued to trend upward, secondary to intravascular volume depletion.  By 2/26, sodium, creatinine, white blood cell count and lactic acid all noted improvement, but patient maintained persistent hypotension and spiked fevers.  Antibiotics changed to cover practically all organisms, but by 2/27, sodium up to 157 with creatinine starting to trend back upward slightly and procalcitonin strongly increased from 0.92-3.08.  Patient felt to be aspirating on her secretions at this point, it is felt that we have maximized medical care short of intubation and ventilator, and are now considering comfort care.

## 2021-07-17 NOTE — Progress Notes (Signed)
[facility] Progress Note  Patient: Megan Miranda    QPY:[id number]  DOA: 07/13/2021    Date of Service: the patient was seen and examined on 07/17/2021  Brief hospital course: 61 year old female with past medical history of bipolar disorder, stage IIIb chronic kidney disease, recently found liver lesion suspicious for metastatic disease of unknown origin sent over from her skilled nursing facility on 07/10/2021 for increased confusion and admitted for sepsis secondary to UTI as well as patient also found to have COVID.  Started on IV fluids, antibiotics and close monitoring.  Patient's hyponatremia persisting, hemoglobin lower today.  Assessment and Plan: Assessment and Plan: * Sepsis ([place])- (present on admission) Patient meets criteria for severe sepsis secondary to UTI given tachycardia, leukocytosis, lactic acidosis, persistent hypotension and urinary source.  Continue antibiotics, monitor blood and urine cultures.  Lactic acid level still elevated so continue lactated Ringer's.  Hypernatremia- (present on admission) Due to dehydration and poor p.o. intake.  Have changed IV fluids to half-normal saline.  Anemia due to chronic kidney disease- (present on admission) Looks to be lower than baseline.  Follow closely  COVID- (present on admission) Mildly elevated CRP at 17.5.  Infection/sepsis looks to be more related to her urinary tract infection.  Acute renal failure superimposed on stage 3b chronic kidney disease ([place])- (present on admission) GFR at 16 today.  Continue IV fluids.  Bipolar I disorder ([place])- (present on admission) Continue antipsychotic, Lamictal       Body mass index is 19.19 kg/m.        Consultants: None  Procedures: None  Antimicrobials: IV Rocephin 2/22-present  Code Status: DNR   Subjective: Patient with garbled unintelligible speech, no acute distress  Objective: Noted mild tachycardia Vitals:   07/17/21 0744 07/17/21 1044  BP:  119/65 (!) 116/41  Pulse: (!) 124 (!) 136  Resp: 20 20  Temp: 98.7 F (37.1 C) 98.7 F (37.1 C)  SpO2: 92% 92%    Intake/Output Summary (Last 24 hours) at 07/17/2021 1421 Last data filed at 07/17/2021 1034 Gross per 24 hour  Intake --  Output 1500 ml  Net -1500 ml   Filed Weights   06/27/2021 1245 07/13/2021 2123  Weight: 57.2 kg 52.3 kg   Body mass index is 19.19 kg/m.  Exam:  General: Awake, not really oriented HEENT: Normocephalic, atraumatic, mucous membranes are dry Cardiovascular: Regular rhythm, tachycardic Respiratory: Clear to auscultation bilaterally Abdomen: Soft, nontender?,  Nondistended, hypoactive bowel sounds Musculoskeletal: No clubbing or cyanosis, trace pitting edema Skin: No skin breaks, tears or lesions Psychiatry: History of bipolar and schizoaffective disorder. Neurology: No focal deficits  Data Reviewed: Labs noteworthy for sodium of 158 and creatinine of 3.21 with lactic acid level of 2.2 improving white blood cell count and hemoglobin of 7.7  Disposition:  Status is: Inpatient Remains inpatient appropriate because: Still acutely ill and needs for further medical stabilization.            Family Communication: Left message for son DVT Prophylaxis: heparin injection 5,000 Units Start: 06/27/2021 1700    Author: Annita Brod ,MD 07/17/2021 2:21 PM  To reach On-call, see care teams to locate the attending and reach out via [email or web address]. Between 7PM-7AM, please contact night-coverage If you still have difficulty reaching the attending provider, please page the [facility] (Director on Call) for [facility] on amion for assistance.

## 2021-07-17 NOTE — Evaluation (Addendum)
Clinical/Bedside Swallow Evaluation Patient Details  Name: Megan Miranda MRN: [id number] Date of Birth: 04/08/1961  Today's Date: 07/17/2021 Time: SLP Start Time (ACUTE ONLY): 0914 SLP Stop Time (ACUTE ONLY): 0934 SLP Time Calculation (min) (ACUTE ONLY): 20 min  Past Medical History:  Past Medical History:  Diagnosis Date   Bipolar disorder ([place])    Schizoaffective disorder ([place])    Past Surgical History: No past surgical history on file. HPI:  Pt is a 61 yo female presenting from SNF with AMS. Admitted with sepsis and found to have UTI, COVID. CXR and CTH negative for acute changes. PMH includes: bipolar disorder, schizoeffective disorder, CKD stage IIIb, recently found liver lesion suspicious for mastastatic with unknown origin, chronic normocytic anemia    Assessment / Plan / Recommendation  Clinical Impression  Pt has signs of dysphagia with concern for decreased airway protection, also with altered mentation that limits command following and use of strategies. She does not swallow ice chips despite automatic initiation of mastication, and coughing ensues. She seems eager for POs and takes them in her mouth consistently, but has a more inconsistent swallow trigger per palpation with  hyolaryngeal movement also seemingly limited. Pt loses thin liquids and purees out of her mouth to her L side, which is the side to which she leans. Recommend that she remain NPO for now except for meds crushed in puree. Will f/u for potential to resume PO diet. SLP Visit Diagnosis: Dysphagia, unspecified (R13.10)    Aspiration Risk  Moderate aspiration risk;Risk for inadequate nutrition/hydration    Diet Recommendation NPO except meds   Medication Administration: Crushed with puree    Other  Recommendations Oral Care Recommendations: Oral care QID Other Recommendations: Have oral suction available    Recommendations for follow up therapy are one component of a multi-disciplinary discharge planning  process, led by the attending physician.  Recommendations may be updated based on patient status, additional functional criteria and insurance authorization.  Follow up Recommendations Skilled nursing-short term rehab (<3 hours/day)      Assistance Recommended at Discharge Frequent or constant Supervision/Assistance  Functional Status Assessment Patient has had a recent decline in their functional status and demonstrates the ability to make significant improvements in function in a reasonable and predictable amount of time.  Frequency and Duration min 2x/week          Prognosis Prognosis for Safe Diet Advancement: Fair Barriers to Reach Goals: Cognitive deficits      Swallow Study   General HPI: Pt is a 61 yo female presenting from SNF with AMS. Admitted with sepsis and found to have UTI, COVID. CXR and CTH negative for acute changes. PMH includes: bipolar disorder, schizoeffective disorder, CKD stage IIIb, recently found liver lesion suspicious for mastastatic with unknown origin, chronic normocytic anemia Type of Study: Bedside Swallow Evaluation Previous Swallow Assessment: none in chart Diet Prior to this Study: NPO Temperature Spikes Noted: Yes (100.1) Respiratory Status: Room air History of Recent Intubation: No Behavior/Cognition: Alert;Requires cueing Oral Cavity Assessment: Dry Oral Care Completed by SLP: No Oral Cavity - Dentition: Poor condition;Missing dentition Patient Positioning: Postural control adequate for testing Baseline Vocal Quality: Normal (speech mumbled) Volitional Cough: Cognitively unable to elicit Volitional Swallow: Unable to elicit    Oral/Motor/Sensory Function Overall Oral Motor/Sensory Function:  (dififculty following commands to assess)   Ice Chips Ice chips: Impaired Presentation: Spoon Pharyngeal Phase Impairments: Unable to trigger swallow;Cough - Immediate   Thin Liquid Thin Liquid: Impaired Presentation: Spoon;Straw Oral Phase  Impairments:  Reduced labial seal;Poor awareness of bolus Oral Phase Functional Implications: Left anterior spillage;Prolonged oral transit Pharyngeal  Phase Impairments: Cough - Immediate;Decreased hyoid-laryngeal movement    Nectar Thick Nectar Thick Liquid: Not tested   Honey Thick Honey Thick Liquid: Not tested   Puree Puree: Impaired Presentation: Spoon Oral Phase Impairments: Poor awareness of bolus Oral Phase Functional Implications: Left anterior spillage Pharyngeal Phase Impairments: Decreased hyoid-laryngeal movement   Solid     Solid: Not tested       Osie Bond., M.A. [place] Pager [phone] Office [phone]  07/17/2021,10:23 AM

## 2021-07-17 NOTE — Assessment & Plan Note (Addendum)
Patient meets criteria for severe sepsis secondary to UTI given tachycardia, leukocytosis, lactic acidosis, persistent hypotension and urinary source.  Continue antibiotics, monitor blood and urine cultures.  Initial lacid level improved.  Blood cultures with no growth to date.  Lactic acid level elevated again, initially thought to be intravascular volume depletion, but with persistent fever and increased procalcitonin suspect she is now aspirating on secretions.

## 2021-07-17 NOTE — Assessment & Plan Note (Addendum)
Slightly lower than baseline.  Stable no evidence of bleeding.

## 2021-07-17 NOTE — Plan of Care (Signed)

## 2021-07-17 NOTE — Plan of Care (Signed)
°  Problem: Clinical Measurements: °Goal: Ability to maintain clinical measurements within normal limits will improve °Outcome: Progressing °Goal: Respiratory complications will improve °Outcome: Progressing °Goal: Cardiovascular complication will be avoided °Outcome: Progressing °  °Problem: Nutrition: °Goal: Adequate nutrition will be maintained °Outcome: Progressing °  °Problem: Coping: °Goal: Level of anxiety will decrease °Outcome: Progressing °  °Problem: Elimination: °Goal: Will not experience complications related to bowel motility °Outcome: Progressing °Goal: Will not experience complications related to urinary retention °Outcome: Progressing °  °

## 2021-07-17 NOTE — TOC Initial Note (Signed)
Transition of Care [facility]) - Initial/Assessment Note    Patient Details  Name: Megan Miranda MRN: [id number] Date of Birth: December 28, 1960  Transition of Care [facility]) CM/SW Contact:    Angelita Ingles, RN Phone [phone]  07/17/2021, 4:14 PM  Clinical Narrative:                  Transition of Care [facility]) Screening Note   Patient Details  Name: Megan Miranda Date of Birth: September 04, 1960   Transition of Care [facility]) CM/SW Contact:    Angelita Ingles, RN Phone Number: 07/17/2021, 4:14 PM    Transition of Care Department [facility]) has reviewed patient and no TOC needs have been identified at this time. We will continue to monitor patient advancement through interdisciplinary progression rounds. If new patient transition needs arise, please place a TOC consult.          Patient Goals and CMS Choice        Expected Discharge Plan and Services                                                Prior Living Arrangements/Services                       Activities of Daily Living      Permission Sought/Granted                  Emotional Assessment              Admission diagnosis:  Sepsis ([place]) [A41.9] Altered mental status, unspecified altered mental status type [R41.82] Stage 4 chronic kidney disease ([place]) [N18.4] Sepsis, due to unspecified organism, unspecified whether acute organ dysfunction present ([place]) [A41.9] COVID [U07.1] Patient Active Problem List   Diagnosis Date Noted   Anemia due to chronic kidney disease 07/17/2021   Hypernatremia 07/17/2021   Sepsis ([place]) 06/26/2021   Altered mental status    COVID    Goals of care, counseling/discussion 06/01/2021   Acute urinary retention 06/01/2021   Delirium due to another medical condition 05/16/2021   Generalized weakness 05/15/2021   AKI (acute kidney injury) ([place]) 05/15/2021   Acute renal failure superimposed on stage 3b chronic kidney disease ([place]) 05/14/2021   Schizoaffective  disorder, manic type ([place]) 12/03/2018   SIRS (systemic inflammatory response syndrome) ([place]) 10/16/2017   Lactic acidosis 10/16/2017   Abnormal TSH 10/16/2017   History of alcohol abuse 10/16/2017   Lithium toxicity 10/15/2017   Bipolar I disorder ([place]) 07/21/2007   PCP:  Patient, No Pcp Per (Inactive) Pharmacy:   [facility] [place], [address] [place] [address] Phone: [phone] Fax: [phone]     Social Determinants of Health (SDOH) Interventions    Readmission Risk Interventions No flowsheet data found.

## 2021-07-17 NOTE — Progress Notes (Signed)
°   07/17/21 0800  Assess: MEWS Score  Temp 98.3 F (36.8 C)  Level of Consciousness Alert  O2 Device Room Air  Assess: MEWS Score  MEWS Temp 0  MEWS Systolic 0  MEWS Pulse 2  MEWS RR 0  MEWS LOC 0  MEWS Score 2  MEWS Score Color Yellow  Assess: if the MEWS score is Yellow or Red  Were vital signs taken at a resting state? Yes  Focused Assessment No change from prior assessment  Early Detection of Sepsis Score *See Row Information* Medium  MEWS guidelines implemented *See Row Information* No, previously yellow, continue vital signs every 4 hours  Treat  Pain Scale 0-10  Pain Score 0  Notify: Charge Nurse/RN  Name of Charge Nurse/RN Notified April, RN  Date Charge Nurse/RN Notified 07/17/21  Time Charge Nurse/RN Notified [id number]

## 2021-07-17 NOTE — Progress Notes (Signed)
[facility]) Hospital Liaison Note  ACC will continue to follow for discharge planning.   Please call with questions or concerns. Thank you  Roselee Nova, [place] Hospital Liaison [phone]

## 2021-07-17 NOTE — Assessment & Plan Note (Addendum)
CRP stable for now with minimal elevation.  Infection/sepsis looks to be more related to her urinary tract infection.  That said, hospice facility unable to take patient until 3/8 due to [place] isolation.  I expect if patient is made comfort care she will likely pass before that

## 2021-07-18 ENCOUNTER — Encounter: Payer: Self-pay | Admitting: *Deleted

## 2021-07-18 DIAGNOSIS — U071 COVID-19: Secondary | ICD-10-CM | POA: Diagnosis not present

## 2021-07-18 DIAGNOSIS — E43 Unspecified severe protein-calorie malnutrition: Secondary | ICD-10-CM | POA: Diagnosis not present

## 2021-07-18 DIAGNOSIS — C787 Secondary malignant neoplasm of liver and intrahepatic bile duct: Secondary | ICD-10-CM | POA: Diagnosis present

## 2021-07-18 DIAGNOSIS — A419 Sepsis, unspecified organism: Secondary | ICD-10-CM | POA: Diagnosis not present

## 2021-07-18 DIAGNOSIS — E870 Hyperosmolality and hypernatremia: Secondary | ICD-10-CM | POA: Diagnosis not present

## 2021-07-18 DIAGNOSIS — N390 Urinary tract infection, site not specified: Secondary | ICD-10-CM | POA: Diagnosis present

## 2021-07-18 LAB — CBC
HCT: 28.7 % — ABNORMAL LOW (ref 36.0–46.0)
Hemoglobin: 8 g/dL — ABNORMAL LOW (ref 12.0–15.0)
MCH: 26.7 pg (ref 26.0–34.0)
MCHC: 27.9 g/dL — ABNORMAL LOW (ref 30.0–36.0)
MCV: 95.7 fL (ref 80.0–100.0)
Platelets: 236 10*3/uL (ref 150–400)
RBC: 3 MIL/uL — ABNORMAL LOW (ref 3.87–5.11)
RDW: 19.1 % — ABNORMAL HIGH (ref 11.5–15.5)
WBC: 13.3 10*3/uL — ABNORMAL HIGH (ref 4.0–10.5)
nRBC: 0.2 % (ref 0.0–0.2)

## 2021-07-18 LAB — BASIC METABOLIC PANEL
Anion gap: 12 (ref 5–15)
BUN: 46 mg/dL — ABNORMAL HIGH (ref 8–23)
CO2: 23 mmol/L (ref 22–32)
Calcium: 9.1 mg/dL (ref 8.9–10.3)
Chloride: 127 mmol/L — ABNORMAL HIGH (ref 98–111)
Creatinine, Ser: 2.91 mg/dL — ABNORMAL HIGH (ref 0.44–1.00)
GFR, Estimated: 18 mL/min — ABNORMAL LOW (ref >60)
Glucose, Bld: 68 mg/dL — ABNORMAL LOW (ref 70–99)
Potassium: 4.3 mmol/L (ref 3.5–5.1)
Sodium: 162 mmol/L (ref 135–145)

## 2021-07-18 LAB — PHOSPHORUS: Phosphorus: 4.5 mg/dL (ref 2.5–4.6)

## 2021-07-18 LAB — FERRITIN: Ferritin: 612 ng/mL — ABNORMAL HIGH (ref 11–307)

## 2021-07-18 LAB — C-REACTIVE PROTEIN: CRP: 19.5 mg/dL — ABNORMAL HIGH (ref <1.0)

## 2021-07-18 LAB — LACTIC ACID, PLASMA: Lactic Acid, Venous: 1.8 mmol/L (ref 0.5–1.9)

## 2021-07-18 LAB — PROCALCITONIN: Procalcitonin: 1.3 ng/mL

## 2021-07-18 LAB — D-DIMER, QUANTITATIVE: D-Dimer, Quant: 6.42 ug/mL-FEU — ABNORMAL HIGH (ref 0.00–0.50)

## 2021-07-18 LAB — MAGNESIUM: Magnesium: 2.6 mg/dL — ABNORMAL HIGH (ref 1.7–2.4)

## 2021-07-18 MED ORDER — DEXTROSE 5 % IV SOLN: INTRAVENOUS | Status: DC

## 2021-07-18 MED ORDER — SODIUM CHLORIDE 0.45 % IV BOLUS: 500.0000 mL | Freq: Once | INTRAVENOUS | Status: AC

## 2021-07-18 NOTE — Assessment & Plan Note (Signed)
Urine culture is unremarkable.

## 2021-07-18 NOTE — Progress Notes (Signed)
Initial Nutrition Assessment  DOCUMENTATION CODES:   Severe malnutrition in context of chronic illness  INTERVENTION:   - RD will follow for diet advancement and order oral nutrition supplements as able  If pt unable to have diet advanced and within [place], recommend Cortrak NG tube placement for initiation of enteral nutrition given severity of malnutrition: - Start Osmolite 1.2 @ 20 ml/hr and advance by 10 ml q 8 hours to goal rate of 60 ml/hr (1440 ml/day) which would provide 1728 kcal, 80 grams of protein, and 1180 ml of free H2O.  Once diet advanced or if enteral nutrition is initiated, monitor magnesium, potassium, and phosphorus BID for at least 3 days, MD to replete as needed, as pt is at risk for refeeding syndrome given severe malnutrition.  NUTRITION DIAGNOSIS:   Severe Malnutrition related to chronic illness (liver lesion, CKD IIIb) as evidenced by severe fat depletion, severe muscle depletion.  GOAL:   Patient will meet greater than or equal to 90% of their needs  MONITOR:   Diet advancement, Labs, Weight trends, I & O's  REASON FOR ASSESSMENT:   Consult Assessment of nutrition requirement/status  ASSESSMENT:   61 year old female who presented to the ED on 2/22 with AMS. PMH of bipolar disorder, schizoaffective disorder, CKD stage IIIb, recently found liver lesion suspicious for metastases with unknown origin, chronic normocytic anemia. Pt admitted with sepsis secondary to UTI, COVID-19.  RD consulted for nutrition assessment. Pt remains NPO after SLP evaluation today. Discussed pt with RN. Pt tolerating medications with applesauce per RN. Reached out to MD regarding potential for Cortrak tube if within [place]. Noted pt was previously going to become a hospice patient; unsure of current plan and whether short-term artificial nutrition would be within pt's [place]. No plan for Cortrak at this time per MD. RD will leave tube feeding recommendations if Cortrak is pursued at a  later date. Will follow for diet advancement and order oral nutrition supplements.  Unable to obtain diet and weight history at this time. Pt very sleepy and not able to engage with RD enough to answer questions. Reviewed weight history in chart.  Pt with a 5.2 kg weight loss since 06/23/21. This is a 9% weight loss in 1 month which is severe and significant for timeframe. Pt meets criteria for severe malnutrition.  Medications reviewed and include: ferrous sulfate, lactulose, lamictal, ativan, zyprexa, IV abx IVF: D5 @ 125 ml/hr  Labs reviewed: sodium 162, BUN 48, creatinine 2.90, magnesium 2.6, CRP 19.5, hemoglobin 7.5, WBC 13.1  UOP: 2400 ml x 24 hours I/O's: -3.3 L since admit  NUTRITION - FOCUSED PHYSICAL EXAM:  Flowsheet Row Most Recent Value  Orbital Region Severe depletion  Upper Arm Region Severe depletion  Thoracic and Lumbar Region Severe depletion  Buccal Region Moderate depletion  Temple Region Moderate depletion  Clavicle Bone Region Severe depletion  Clavicle and Acromion Bone Region Severe depletion  Scapular Bone Region Severe depletion  Dorsal Hand Moderate depletion  Patellar Region Severe depletion  Anterior Thigh Region Severe depletion  Posterior Calf Region Severe depletion  Edema (RD Assessment) None  Hair Reviewed  Eyes Reviewed  Mouth Reviewed  Skin Reviewed  Nails Reviewed       Diet Order:   Diet Order             Diet NPO time specified  Diet effective now                   EDUCATION NEEDS:  Not appropriate for education at this time  Skin:  Skin Assessment: Reviewed RN Assessment  Last BM:  no documented BM  Height:   Ht Readings from Last 1 Encounters:  07/20/2021 5\' 5"  (1.651 m)    Weight:   Wt Readings from Last 1 Encounters:  07/12/2021 52.3 kg    BMI:  Body mass index is 19.19 kg/m.  Estimated Nutritional Needs:   Kcal:  1600-1800  Protein:  75-90 grams  Fluid:  1.6-1.8 L    Gustavus Bryant, MS, RD,  LDN Inpatient Clinical Dietitian Please see AMiON for contact information.

## 2021-07-18 NOTE — Assessment & Plan Note (Addendum)
Nutrition to see.  BMI 19.19.  Given her confusion, for now she is being kept n.p.o. as per speech therapy recommendations.  She should now be comfort care.

## 2021-07-18 NOTE — Progress Notes (Signed)
[facility] Progress Note  Patient: Megan Miranda    PYK:[id number]  DOA: 07/20/2021    Date of Service: the patient was seen and examined on 07/18/2021  Brief hospital course: 61 year old female with past medical history of bipolar disorder, stage IIIb chronic kidney disease, recently found liver lesion suspicious for metastatic disease of unknown origin sent over from her skilled nursing facility on 07/06/2021 for increased confusion and admitted for sepsis secondary to UTI as well as patient also found to have COVID.  Started on IV fluids, antibiotics and close monitoring.  Following hospitalization, patient has responded to antibiotics.  She has become more alert although remains hypernatremic.  Assessment and Plan: Assessment and Plan: * Severe sepsis ([place])- (present on admission) Patient meets criteria for severe sepsis secondary to UTI given tachycardia, leukocytosis, lactic acidosis, persistent hypotension and urinary source.  Continue antibiotics, monitor blood and urine cultures.  Lactic acid level improved.  Blood cultures with no growth to date  UTI (urinary tract infection)- (present on admission) Urine culture is unremarkable.  Hypernatremia- (present on admission) Due to dehydration and poor p.o. intake.  Despite half-normal saline, remaining elevated.  Have changed over to D5W.  Acute renal failure superimposed on stage 3b chronic kidney disease ([place])- (present on admission) Minimal improvement with IV fluids with half-normal saline.  Hopefully should improve with D5W.  COVID- (present on admission) CRP stable for now with minimal elevation.  Infection/sepsis looks to be more related to her urinary tract infection.  Protein-calorie malnutrition, severe ([place])- (present on admission) Nutrition to see.  BMI 19.19.  Given her confusion, for now she is being kept n.p.o. as per speech therapy recommendations.  Anemia due to chronic kidney disease- (present on  admission) Slightly lower than baseline.  Stable no evidence of bleeding.  Bipolar I disorder ([place])- (present on admission) Continue antipsychotic, Lamictal       Body mass index is 19.19 kg/m.        Consultants: None  Procedures: None  Antimicrobials: IV Rocephin 2/22-present  Code Status: DNR   Subjective: Patient with no complaints, chronic underlying confusion  Objective: Noted mild tachycardia Vitals:   07/18/21 0800 07/18/21 1200  BP: (!) 99/46 (!) 91/45  Pulse: (!) 118 (!) 118  Resp: 20 20  Temp: 97.9 F (36.6 C) 97.8 F (36.6 C)  SpO2: 96% 94%    Intake/Output Summary (Last 24 hours) at 07/18/2021 1419 Last data filed at 07/18/2021 0420 Gross per 24 hour  Intake --  Output 1900 ml  Net -1900 ml    Filed Weights   06/30/2021 1245 06/29/2021 2123  Weight: 57.2 kg 52.3 kg   Body mass index is 19.19 kg/m.  Exam:  General: Awake, not really oriented HEENT: Normocephalic, atraumatic, mucous membranes are dry Cardiovascular: Regular rhythm, tachycardic Respiratory: Clear to auscultation bilaterally Abdomen: Soft, nontender?,  Nondistended, hypoactive bowel sounds Musculoskeletal: No clubbing or cyanosis, trace pitting edema Skin: No skin breaks, tears or lesions Psychiatry: History of bipolar and schizoaffective disorder. Neurology: No focal deficits  Data Reviewed: Labs noteworthy for sodium of 162, normalized lactic acid  Disposition:  Status is: Inpatient Remains inpatient appropriate because: Still acutely ill and needs for further medical stabilization.    Family Communication: Patient's brother is currently in [place] and will be back in a week.  Communication through a mutual friend, one of the local surgeons.  DVT Prophylaxis: heparin injection 5,000 Units Start: 07/02/2021 1700    Author: Annita Brod ,MD 07/18/2021 2:19 PM  To reach On-call, see care teams to locate the attending and reach out via  [email or web address]. Between 7PM-7AM, please contact night-coverage If you still have difficulty reaching the attending provider, please page the [facility] (Director on Call) for [facility] on amion for assistance.

## 2021-07-18 NOTE — Progress Notes (Signed)
Speech Language Pathology Treatment: Dysphagia  Patient Details Name: Megan Miranda MRN: [id number] DOB: 06/26/1960 Today's Date: 07/18/2021 Time: 9892-1194 SLP Time Calculation (min) (ACUTE ONLY): 21 min  Assessment / Plan / Recommendation Clinical Impression  Pt shows mild improvements in clarity of speech and cognitively-based oral deficits compared to previous date, but she also has a new baseline congested cough and reports that she "has a little cold" today. This baseline cough complicates clinical assessment, but she had frequent coughing post-swallow with frequency also subsiding once PO trials were held. She still has anterior loss of boluses, reduced awareness during transitioning between bolus consistencies, and makes attempts to get more of a bolus before swallowing despite the cup/spoon being removed from pt's view. Recommend that she remain NPO except for meds crushed in puree. Would try to perform thorough oral care frequently.   HPI HPI: Pt is a 61 yo female presenting from SNF with AMS. Admitted with sepsis and found to have UTI, COVID. CXR and CTH negative for acute changes. PMH includes: bipolar disorder, schizoeffective disorder, CKD stage IIIb, recently found liver lesion suspicious for mastastatic with unknown origin, chronic normocytic anemia      SLP Plan  Continue with current plan of care      Recommendations for follow up therapy are one component of a multi-disciplinary discharge planning process, led by the attending physician.  Recommendations may be updated based on patient status, additional functional criteria and insurance authorization.    Recommendations  Diet recommendations: NPO Medication Administration: Crushed with puree                Oral Care Recommendations: Oral care QID Follow Up Recommendations: Skilled nursing-short term rehab (<3 hours/day) Assistance recommended at discharge: Frequent or constant Supervision/Assistance SLP Visit  Diagnosis: Dysphagia, unspecified (R13.10) Plan: Continue with current plan of care           Osie Bond., M.A. [facility] Pager [phone] Office [phone]  07/18/2021, 9:41 AM

## 2021-07-19 DIAGNOSIS — A419 Sepsis, unspecified organism: Secondary | ICD-10-CM | POA: Diagnosis not present

## 2021-07-19 DIAGNOSIS — C787 Secondary malignant neoplasm of liver and intrahepatic bile duct: Secondary | ICD-10-CM

## 2021-07-19 DIAGNOSIS — N179 Acute kidney failure, unspecified: Secondary | ICD-10-CM | POA: Diagnosis not present

## 2021-07-19 DIAGNOSIS — C801 Malignant (primary) neoplasm, unspecified: Secondary | ICD-10-CM

## 2021-07-19 DIAGNOSIS — E870 Hyperosmolality and hypernatremia: Secondary | ICD-10-CM | POA: Diagnosis not present

## 2021-07-19 LAB — TYPE AND SCREEN
ABO/RH(D): A POS
Antibody Screen: NEGATIVE

## 2021-07-19 LAB — LACTIC ACID, PLASMA
Lactic Acid, Venous: 2.3 mmol/L (ref 0.5–1.9)
Lactic Acid, Venous: 4 mmol/L (ref 0.5–1.9)
Lactic Acid, Venous: 2.8 mmol/L (ref 0.5–1.9)

## 2021-07-19 LAB — CBC WITH DIFFERENTIAL/PLATELET
Abs Immature Granulocytes: 0.21 10*3/uL — ABNORMAL HIGH (ref 0.00–0.07)
Basophils Absolute: 0 10*3/uL (ref 0.0–0.1)
Basophils Relative: 0 %
Eosinophils Absolute: 0.1 10*3/uL (ref 0.0–0.5)
Eosinophils Relative: 1 %
HCT: 28.5 % — ABNORMAL LOW (ref 36.0–46.0)
Hemoglobin: 8 g/dL — ABNORMAL LOW (ref 12.0–15.0)
Immature Granulocytes: 2 %
Lymphocytes Relative: 16 %
Lymphs Abs: 1.8 10*3/uL (ref 0.7–4.0)
MCH: 26.1 pg (ref 26.0–34.0)
MCHC: 28.1 g/dL — ABNORMAL LOW (ref 30.0–36.0)
MCV: 92.8 fL (ref 80.0–100.0)
Monocytes Absolute: 0.4 10*3/uL (ref 0.1–1.0)
Monocytes Relative: 4 %
Neutro Abs: 8.5 10*3/uL — ABNORMAL HIGH (ref 1.7–7.7)
Neutrophils Relative %: 77 %
Platelets: 190 10*3/uL (ref 150–400)
RBC: 3.07 MIL/uL — ABNORMAL LOW (ref 3.87–5.11)
RDW: 18.2 % — ABNORMAL HIGH (ref 11.5–15.5)
WBC: 11 10*3/uL — ABNORMAL HIGH (ref 4.0–10.5)
nRBC: 0.2 % (ref 0.0–0.2)

## 2021-07-19 LAB — D-DIMER, QUANTITATIVE: D-Dimer, Quant: 5.51 ug{FEU}/mL — ABNORMAL HIGH (ref 0.00–0.50)

## 2021-07-19 LAB — COMPREHENSIVE METABOLIC PANEL
ALT: 16 U/L (ref 0–44)
AST: 43 U/L — ABNORMAL HIGH (ref 15–41)
Albumin: 1.7 g/dL — ABNORMAL LOW (ref 3.5–5.0)
Alkaline Phosphatase: 233 U/L — ABNORMAL HIGH (ref 38–126)
Anion gap: 11 (ref 5–15)
BUN: 26 mg/dL — ABNORMAL HIGH (ref 8–23)
CO2: 17 mmol/L — ABNORMAL LOW (ref 22–32)
Calcium: 8.2 mg/dL — ABNORMAL LOW (ref 8.9–10.3)
Chloride: 111 mmol/L (ref 98–111)
Creatinine, Ser: 2.24 mg/dL — ABNORMAL HIGH (ref 0.44–1.00)
GFR, Estimated: 24 mL/min — ABNORMAL LOW (ref >60)
Glucose, Bld: 94 mg/dL (ref 70–99)
Potassium: 4.2 mmol/L (ref 3.5–5.1)
Sodium: 139 mmol/L (ref 135–145)
Total Bilirubin: 0.4 mg/dL (ref 0.3–1.2)
Total Protein: 5.2 g/dL — ABNORMAL LOW (ref 6.5–8.1)

## 2021-07-19 LAB — BASIC METABOLIC PANEL WITH GFR
Anion gap: 15 (ref 5–15)
BUN: 36 mg/dL — ABNORMAL HIGH (ref 8–23)
CO2: 19 mmol/L — ABNORMAL LOW (ref 22–32)
Calcium: 8.4 mg/dL — ABNORMAL LOW (ref 8.9–10.3)
Chloride: 121 mmol/L — ABNORMAL HIGH (ref 98–111)
Creatinine, Ser: 2.61 mg/dL — ABNORMAL HIGH (ref 0.44–1.00)
GFR, Estimated: 20 mL/min — ABNORMAL LOW
Glucose, Bld: 130 mg/dL — ABNORMAL HIGH (ref 70–99)
Potassium: 2.9 mmol/L — ABNORMAL LOW (ref 3.5–5.1)
Sodium: 155 mmol/L — ABNORMAL HIGH (ref 135–145)

## 2021-07-19 LAB — CBC
HCT: 23.2 % — ABNORMAL LOW (ref 36.0–46.0)
Hemoglobin: 6.6 g/dL — CL (ref 12.0–15.0)
MCH: 26.4 pg (ref 26.0–34.0)
MCHC: 28.4 g/dL — ABNORMAL LOW (ref 30.0–36.0)
MCV: 92.8 fL (ref 80.0–100.0)
Platelets: 193 K/uL (ref 150–400)
RBC: 2.5 MIL/uL — ABNORMAL LOW (ref 3.87–5.11)
RDW: 18.6 % — ABNORMAL HIGH (ref 11.5–15.5)
WBC: 11.1 K/uL — ABNORMAL HIGH (ref 4.0–10.5)
nRBC: 0 % (ref 0.0–0.2)

## 2021-07-19 LAB — PHOSPHORUS: Phosphorus: 2.9 mg/dL (ref 2.5–4.6)

## 2021-07-19 LAB — HEMOGLOBIN AND HEMATOCRIT, BLOOD
HCT: 26.5 % — ABNORMAL LOW (ref 36.0–46.0)
Hemoglobin: 7.4 g/dL — ABNORMAL LOW (ref 12.0–15.0)

## 2021-07-19 LAB — FERRITIN: Ferritin: 534 ng/mL — ABNORMAL HIGH (ref 11–307)

## 2021-07-19 LAB — MAGNESIUM
Magnesium: 2.2 mg/dL (ref 1.7–2.4)
Magnesium: 1.7 mg/dL (ref 1.7–2.4)

## 2021-07-19 LAB — TROPONIN I (HIGH SENSITIVITY): Troponin I (High Sensitivity): 16 ng/L (ref <18)

## 2021-07-19 LAB — C-REACTIVE PROTEIN: CRP: 15.7 mg/dL — ABNORMAL HIGH (ref <1.0)

## 2021-07-19 LAB — ABO/RH: ABO/RH(D): A POS

## 2021-07-19 MED ORDER — POTASSIUM CHLORIDE 10 MEQ/100ML IV SOLN
10.0000 meq | INTRAVENOUS | Status: AC
  Administered 2021-07-19 (×6): 10 meq via INTRAVENOUS
  Filled 2021-07-19 (×6): qty 100

## 2021-07-19 MED ORDER — DEXTROSE 5 % IV BOLUS
1000.0000 mL | Freq: Once | INTRAVENOUS | Status: AC
  Administered 2021-07-19: 1000 mL via INTRAVENOUS

## 2021-07-19 MED ORDER — DEXTROSE 5 % IV BOLUS
500.0000 mL | Freq: Once | INTRAVENOUS | Status: AC
  Administered 2021-07-19: 500 mL via INTRAVENOUS

## 2021-07-19 NOTE — Progress Notes (Signed)
Speech Language Pathology Treatment: Dysphagia  Patient Details Name: Megan Miranda MRN: [id number] DOB: Jul 27, 1960 Today's Date: 07/19/2021 Time: 6314-9702 SLP Time Calculation (min) (ACUTE ONLY): 20 min  Assessment / Plan / Recommendation Clinical Impression  Patient seen by SLP for skilled treatment session focused on PO trials. When SLP arrived in room, patient awake and alert, leaning towards left side of bed and left hand appeared contracted. SLP unable to move patient to center of bed as patient very rigid. After oral care, SLP observed patient with PO intake of small ice chips, teaspoon sip water, cup sip water, and 3-4 bites of applesauce. Patient unable to use straw to sip and when SLP providing cup sip, patient not forming mouth adequately on cup. She did accept ice chips and water sips by spoon, exhibiting anterior spillage with water and prolonged mastication of ice chips. She tolerated 1/2 teaspoon sips of applesauce with no oral residuals observed post swallows. Patient exhibited a mildly delayed, congested non productive sounding cough although unable to determine cause, SLP did not observe an increase in intensity or frequency of cough during or after PO trials. SLP spoke with RN after session and recommended continued NPO status but allow PRN ice chips and small amounts applesauce when alert. SLP will continue to follow patient for PO readiness.    HPI HPI: Pt is a 61 yo female presenting from SNF with AMS. Admitted with sepsis and found to have UTI, COVID. CXR and CTH negative for acute changes. PMH includes: bipolar disorder, schizoeffective disorder, CKD stage IIIb, recently found liver lesion suspicious for mastastatic with unknown origin, chronic normocytic anemia      SLP Plan  Continue with current plan of care      Recommendations for follow up therapy are one component of a multi-disciplinary discharge planning process, led by the attending physician.  Recommendations  may be updated based on patient status, additional functional criteria and insurance authorization.    Recommendations  Diet recommendations: NPO;Other(comment) (PRN ice chips and applesauce bites) Medication Administration: Crushed with puree Supervision: Trained caregiver to feed patient;Full supervision/cueing for compensatory strategies Compensations: Slow rate;Small sips/bites                Oral Care Recommendations: Oral care QID;Oral care before and after PO;Staff/trained caregiver to provide oral care;Oral care BID Follow Up Recommendations: Skilled nursing-short term rehab (<3 hours/day) SLP Visit Diagnosis: Dysphagia, unspecified (R13.10) Plan: Continue with current plan of care          Sonia Baller, MA, CCC-SLP Speech Therapy

## 2021-07-19 NOTE — Progress Notes (Signed)
Manufacturing engineer [facility]) Hospital Liaison Note   Referral received for patient/family interest in [facility].    Patient has tested positive for COVID. Unfortunately, we can not take her at [facility] until she has completed her 10 day isolation period.   Chart is under review by [facility] physician and determination for inpatient hospice will be made closer to when she is off of isolation.    Please call with any questions or concerns. Thank you.  Venia Carbon BSN, RN [facility] Liaison

## 2021-07-19 NOTE — Progress Notes (Addendum)
HOSPITAL MEDICINE OVERNIGHT EVENT NOTE    Notified by nursing that patient has continued clinical deterioration throughout the day with nursing concerns over intermittent hypotension and rising lactic acid.  Chart reviewed, patient is currently suffering from severe sepsis with evidence of multiorgan dysfunction.  Hospital course complicated by significant electrolyte derangements including hypokalemia and hypernatremia as well as significant anemia.  Furthermore, patient's brother who is currently in [place] has spoken to the bedside nurse via phone conversation to receive an update.  Considering patient's significant acuity, will obtain repeat lactic acid determine if this has plateaued, repeat CBC to reassess hemoglobin, repeat chemistry to assess patient's numerous severe electrolyte derangements.  We will additionally obtain repeat cultures as well since initial set of cultures were negative.  We will then address these labs accordingly based on results.  Vernelle Emerald  MD [facility]   ADDENDUM 6:50am  I went to go evaluate the patient at the bedside at approximately 4AM due to recurrent bouts of hypotension throughout the evening with systolic blood pressures decreasing to as low as the 60s on occasion.  Patient is responsive to verbal and painful stimuli but is not following commands.  Patient does not appear to be in distress but is quite lethargic.  Skin examination reveals exceedingly poor skin turgor.  Heart sounds are tachycardic and regular.  Breath sounds reveal bibasilar rales without wheezing.  Abdomen is soft with mild lower abdominal discomfort on palpation.  Extremities are warm to the touch.  Review of chemistry yesterday evening revealed that hypernatremia had resolved.  D5 water infusion was discontinued and patient was instead transitioned to normal saline boluses followed by normal saline infusion.  I have bolused the patient with a total of 1500 cc of  normal saline with hypotension transiently resolving after each bolus.  Due to persisting hypotension I have added on a cortisol level to the morning labs although I expect this to be normal or elevated.  I now have patient on 125 cc an hour of normal saline.  Due to patient's tenuous clinical situation and concern for persisting severe sepsis I am broadening the antibiotic coverage.  I have discontinued ceftriaxone and instead have placed patient on intravenous cefepime vancomycin and metronidazole.  Chest x-ray was additionally repeated which revealed no evidence of progressive pneumonia or infiltrates.  Continuing close clinical monitoring.  Sherryll Burger Tonae Livolsi

## 2021-07-19 NOTE — Progress Notes (Addendum)
HOSPITAL MEDICINE OVERNIGHT EVENT NOTE    Notified by nursing that patient's hemoglobin this morning is 6.6, a dramatic drop from 8.0 yesterday.  Nursing reports no clinical evidence of bleeding.  Considering the substantial drop without evidence of bleeding will obtain a type and screen as well as a stat hemoglobin and hematocrit to confirm this degree of anemia.  If it does confirm that hemoglobin is under 7 then we will proceed with blood transfusion.  Furthermore nursing reports the patient is hypokalemic with potassium of 2.9.  Ordered 60 mill equivalents of intravenous potassium over the next 6 hours.  Megan Emerald  MD [facility]   ADDENDUM 2/25 6:30am  Repeat Hemoglobin 7.4.    Megan Miranda Megan Miranda

## 2021-07-19 NOTE — Progress Notes (Signed)
Critical results: Hgb 6.6 and lactic acid 2.3. Physician paged. Continued to monitor.

## 2021-07-19 NOTE — Progress Notes (Signed)
[facility] Progress Note  Patient: Megan Miranda    HFW:[id number]  DOA: 07/03/2021    Date of Service: the patient was seen and examined on 07/19/2021  Brief hospital course: 61 year old female with past medical history of bipolar disorder, stage IIIb chronic kidney disease, recently found liver lesion suspicious for metastatic disease of unknown origin sent over from her skilled nursing facility on 07/02/2021 for increased confusion and admitted for sepsis secondary to UTI as well as patient also found to have COVID.  Started on IV fluids, antibiotics and close monitoring.  Following hospitalization, patient has responded to antibiotics.  She has become more alert although remains hypernatremic, so being treated with D5W.  By 2/25, white blood cell count, creatinine, sodium and procalcitonin all improving although lactic acid level continues to trend upward.  No further fevers and patient still continues episodes of hypotension requiring fluid boluses.    Assessment and Plan: Assessment and Plan: * Severe sepsis ([place])- (present on admission) Patient meets criteria for severe sepsis secondary to UTI given tachycardia, leukocytosis, lactic acidosis, persistent hypotension and urinary source.  Continue antibiotics, monitor blood and urine cultures.  Initial lacid level improved.  Blood cultures with no growth to date.  Lactic acid level elevated again, but this time likely from intravascular volume depletion.  See below.  UTI (urinary tract infection)- (present on admission) Urine culture is unremarkable.  Hypernatremia- (present on admission) Due to dehydration and poor p.o. intake.  Despite half-normal saline, so changed over to D5W and this time, sodium starting to improve.  Given increasing lactic acid and some hypotension, have given D5W bolus  Acute renal failure superimposed on stage 3b chronic kidney disease ([place])- (present on admission) Minimal improvement with IV fluids with  half-normal saline.  Responding to D5W.  COVID- (present on admission) CRP stable for now with minimal elevation.  Infection/sepsis looks to be more related to her urinary tract infection.  Protein-calorie malnutrition, severe ([place])- (present on admission) Nutrition to see.  BMI 19.19.  Given her confusion, for now she is being kept n.p.o. as per speech therapy recommendations.  Anemia due to chronic kidney disease- (present on admission) Slightly lower than baseline.  Stable no evidence of bleeding.  Bipolar I disorder ([place])- (present on admission) Continue antipsychotic, Lamictal       Body mass index is 19.19 kg/m.  Nutrition Problem: Severe Malnutrition Etiology: chronic illness (liver lesion, CKD IIIb)     Consultants: None  Procedures: None  Antimicrobials: IV Rocephin 2/22-present  Code Status: DNR   Subjective: More somnolent today  Objective: Noted mild tachycardia Vitals:   07/19/21 1417 07/19/21 1601  BP: (!) 102/53 96/84  Pulse: 90 92  Resp: 18 (!) 25  Temp:  98 F (36.7 C)  SpO2: 98% 100%    Intake/Output Summary (Last 24 hours) at 07/19/2021 1633 Last data filed at 07/19/2021 0739 Gross per 24 hour  Intake 125.02 ml  Output 1350 ml  Net -1224.98 ml    Filed Weights   07/11/2021 1245 07/01/2021 2123  Weight: 57.2 kg 52.3 kg   Body mass index is 19.19 kg/m.  Exam:  General: More somnolent HEENT: Normocephalic, atraumatic, mucous membranes are dry Cardiovascular: Regular rhythm, borderline tachycardia Respiratory: Clear to auscultation bilaterally Abdomen: Soft, nontender?,  Nondistended, hypoactive bowel sounds Musculoskeletal: No clubbing or cyanosis, trace pitting edema Skin: No skin breaks, tears or lesions Psychiatry: History of bipolar and schizoaffective disorder. Neurology: No focal deficits  Data Reviewed: Noted improving sodium, white blood cell  count, procalcitonin and creatinine.  Noted increasing lactic  acids.  Disposition:  Status is: Inpatient Remains inpatient appropriate because: Still acutely ill and needs for further medical stabilization.    Family Communication: Patient's brother is currently in [place] and will be back in a week.  Communication through a mutual friend, one of the local surgeons.  DVT Prophylaxis: heparin injection 5,000 Units Start: 07/19/2021 1700    Author: Annita Brod ,MD 07/19/2021 4:33 PM  To reach On-call, see care teams to locate the attending and reach out via [email or web address]. Between 7PM-7AM, please contact night-coverage If you still have difficulty reaching the attending provider, please page the [facility] (Director on Call) for [facility] on amion for assistance.

## 2021-07-19 NOTE — Progress Notes (Signed)
Patient's potassium back at 2.9 down from 4.3. 5% Dextrose running at 125 ml/hr. Dr. Cyd Silence paged. Continue to monitor.

## 2021-07-19 NOTE — Progress Notes (Signed)
Around 11:42 am,Pt's BP 74/50, even after 574mL bolus and pt less responsive than she was earlier. RN paged MD who called back immediately. MD gave verbal order to give 1L bolus over 1 hr. RN started bolus. Pt's BP up to 101/60. RN continuing to monitor.  RN saw pt's lactic acid had increased to 4.0 and paged MD, who put in order for another 1L bolus. Pt is receiving bolus. RN continuing to monitor.

## 2021-07-19 NOTE — Progress Notes (Signed)
°   07/19/21 2130  Assess: MEWS Score  Temp 98.4 F (36.9 C)  BP (!) 68/59  Pulse Rate (!) 102  ECG Heart Rate (!) 103  Resp (!) 29  Level of Consciousness Alert  SpO2 96 %  Assess: MEWS Score  MEWS Temp 0  MEWS Systolic 3  MEWS Pulse 1  MEWS RR 2  MEWS LOC 0  MEWS Score 6  MEWS Score Color Red  Assess: if the MEWS score is Yellow or Red  Were vital signs taken at a resting state? Yes  Focused Assessment Change from prior assessment (see assessment flowsheet)  Early Detection of Sepsis Score *See Row Information* Low  MEWS guidelines implemented *See Row Information* No, previously red, continue vital signs every 4 hours  Treat  MEWS Interventions Escalated (See documentation below)  Pain Scale Faces  Pain Score 4  Notify: Charge Nurse/RN  Name of Charge Nurse/RN Notified Lupita Leash, RN  Date Charge Nurse/RN Notified 07/19/21  Time Charge Nurse/RN Notified 2158  Notify: Provider  Provider Name/Title Dr. Cyd Silence  Date Provider Notified 07/19/21  Time Provider Notified 2158  Notification Type Page  Notification Reason Change in status   Writer found patient agitated at beginning of shift, familiar with patient from last evening. Patient continuously coughing weakly, seems unable to clear, does not sound congested. Patient incomprehensible in speech; however, was able to signal she was cold. Warm blankets provided, repositioned, changing done and patient sat up in bed, relieving some distress..   Blood pressure cuff moved to arm from leg, showing lower blood pressures than original.   Patient NPO, per speech eval, to give meds crushed in small amt applesauce but nothing more. Patient unable to open her mouth wide enough tonight to take 100% of either ativan or Zyprexa (estimated 60%).   Assessed patient, noted following changes from last evening, abdomen now distended, 1+ pitting edema in upper and lower extremities, new. Cap refill more sluggish. Coarse crackles in  bases (challenging exam due to continuous weak coughing and inability to follow direction. Patient is alert but restless. Hypotension, tachycardia noted. Room air saturations remain stable.  Of note, Probation officer called brother with update of general decline throughout day and was able to speak with him at approximately 22. He is in [place] and will not be back in the country until Monday. He said he was sending a friend tomorrow who helps with her [name]. Brother asked for update from MD and may want to talk about goals.   Dr. Cyd Silence was alert of general decline throughout day, brother wanting update, consistent weak coughing and agitation, as well as request for order for something to help ? Mucinex. Page returned, STAT labs ordered, asked for update on assessment changes, awaiting labs and update, go from there.   Writer sent update to Dr. Cyd Silence on findings, hypotension, and again, inability to clear, weak cough, and now inability to take meds fully, ?Mucinex and ?change Ativan to IV.   David from rapid response was called and asked to come by and see the patient, also to keep this patient on radar. Charge nurse alerted, and when MEWS went from yellow to Red, Dr. Cyd Silence sent additional page with alert.  Continue to monitor

## 2021-07-19 NOTE — Plan of Care (Signed)
°  Problem: Education: Goal: Knowledge of General Education information will improve Description: Including pain rating scale, medication(s)/side effects and non-pharmacologic comfort measures Outcome: Progressing   Problem: Health Behavior/Discharge Planning: Goal: Ability to manage health-related needs will improve Outcome: Progressing   Problem: Clinical Measurements: Goal: Diagnostic test results will improve Outcome: Progressing   Problem: Nutrition: Goal: Adequate nutrition will be maintained Outcome: Progressing   Problem: Safety: Goal: Ability to remain free from injury will improve Outcome: Progressing   Problem: Skin Integrity: Goal: Risk for impaired skin integrity will decrease Outcome: Progressing

## 2021-07-20 ENCOUNTER — Inpatient Hospital Stay (HOSPITAL_COMMUNITY): Payer: BC Managed Care – PPO

## 2021-07-20 DIAGNOSIS — E870 Hyperosmolality and hypernatremia: Secondary | ICD-10-CM | POA: Diagnosis not present

## 2021-07-20 DIAGNOSIS — N179 Acute kidney failure, unspecified: Secondary | ICD-10-CM | POA: Diagnosis not present

## 2021-07-20 DIAGNOSIS — A419 Sepsis, unspecified organism: Secondary | ICD-10-CM | POA: Diagnosis not present

## 2021-07-20 DIAGNOSIS — C787 Secondary malignant neoplasm of liver and intrahepatic bile duct: Secondary | ICD-10-CM | POA: Diagnosis not present

## 2021-07-20 LAB — LACTIC ACID, PLASMA: Lactic Acid, Venous: 1.7 mmol/L (ref 0.5–1.9)

## 2021-07-20 LAB — BASIC METABOLIC PANEL
Anion gap: 10 (ref 5–15)
BUN: 24 mg/dL — ABNORMAL HIGH (ref 8–23)
CO2: 16 mmol/L — ABNORMAL LOW (ref 22–32)
Calcium: 7.9 mg/dL — ABNORMAL LOW (ref 8.9–10.3)
Chloride: 116 mmol/L — ABNORMAL HIGH (ref 98–111)
Creatinine, Ser: 2.08 mg/dL — ABNORMAL HIGH (ref 0.44–1.00)
GFR, Estimated: 27 mL/min — ABNORMAL LOW (ref >60)
Glucose, Bld: 75 mg/dL (ref 70–99)
Potassium: 4.4 mmol/L (ref 3.5–5.1)
Sodium: 142 mmol/L (ref 135–145)

## 2021-07-20 LAB — PROCALCITONIN: Procalcitonin: 0.92 ng/mL

## 2021-07-20 LAB — CBC
HCT: 26.4 % — ABNORMAL LOW (ref 36.0–46.0)
Hemoglobin: 7.5 g/dL — ABNORMAL LOW (ref 12.0–15.0)
MCH: 26.2 pg (ref 26.0–34.0)
MCHC: 28.4 g/dL — ABNORMAL LOW (ref 30.0–36.0)
MCV: 92.3 fL (ref 80.0–100.0)
Platelets: 183 10*3/uL (ref 150–400)
RBC: 2.86 MIL/uL — ABNORMAL LOW (ref 3.87–5.11)
RDW: 18.2 % — ABNORMAL HIGH (ref 11.5–15.5)
WBC: 9.8 10*3/uL (ref 4.0–10.5)
nRBC: 0 % (ref 0.0–0.2)

## 2021-07-20 LAB — C-REACTIVE PROTEIN: CRP: 11.2 mg/dL — ABNORMAL HIGH (ref <1.0)

## 2021-07-20 LAB — MAGNESIUM: Magnesium: 1.7 mg/dL (ref 1.7–2.4)

## 2021-07-20 LAB — D-DIMER, QUANTITATIVE: D-Dimer, Quant: 6.91 ug/mL-FEU — ABNORMAL HIGH (ref 0.00–0.50)

## 2021-07-20 LAB — URINALYSIS, ROUTINE W REFLEX MICROSCOPIC
Bilirubin Urine: NEGATIVE
Glucose, UA: NEGATIVE mg/dL
Ketones, ur: NEGATIVE mg/dL
Nitrite: NEGATIVE
Protein, ur: NEGATIVE mg/dL
Specific Gravity, Urine: 1.002 — ABNORMAL LOW (ref 1.005–1.030)
pH: 5 (ref 5.0–8.0)

## 2021-07-20 LAB — CORTISOL-AM, BLOOD: Cortisol - AM: 27.3 ug/dL — ABNORMAL HIGH (ref 6.7–22.6)

## 2021-07-20 LAB — FERRITIN: Ferritin: 752 ng/mL — ABNORMAL HIGH (ref 11–307)

## 2021-07-20 LAB — PHOSPHORUS: Phosphorus: 2.7 mg/dL (ref 2.5–4.6)

## 2021-07-20 LAB — BRAIN NATRIURETIC PEPTIDE: B Natriuretic Peptide: 56.3 pg/mL (ref 0.0–100.0)

## 2021-07-20 MED ORDER — SODIUM CHLORIDE 0.9 % IV BOLUS
500.0000 mL | Freq: Once | INTRAVENOUS | Status: AC
  Administered 2021-07-20: 500 mL via INTRAVENOUS

## 2021-07-20 MED ORDER — SODIUM CHLORIDE 0.9 % IV BOLUS
1000.0000 mL | Freq: Once | INTRAVENOUS | Status: AC
  Administered 2021-07-20: 1000 mL via INTRAVENOUS

## 2021-07-20 MED ORDER — SODIUM CHLORIDE 0.9 % IV SOLN
2.0000 g | INTRAVENOUS | Status: DC
Start: 2021-07-20 — End: 2021-07-22
  Administered 2021-07-20 – 2021-07-22 (×3): 2 g via INTRAVENOUS
  Filled 2021-07-20 (×3): qty 2

## 2021-07-20 MED ORDER — VANCOMYCIN HCL IN DEXTROSE 1-5 GM/200ML-% IV SOLN
1000.0000 mg | INTRAVENOUS | Status: DC
  Administered 2021-07-20 – 2021-07-22 (×2): 1000 mg via INTRAVENOUS
  Filled 2021-07-20 (×2): qty 200

## 2021-07-20 MED ORDER — WHITE PETROLATUM EX OINT
TOPICAL_OINTMENT | CUTANEOUS | Status: DC | PRN
  Filled 2021-07-20: qty 28.35

## 2021-07-20 MED ORDER — ALBUTEROL SULFATE (2.5 MG/3ML) 0.083% IN NEBU
2.5000 mg | INHALATION_SOLUTION | Freq: Four times a day (QID) | RESPIRATORY_TRACT | Status: DC | PRN
  Administered 2021-07-20: 2.5 mg via RESPIRATORY_TRACT
  Filled 2021-07-20: qty 3

## 2021-07-20 MED ORDER — ACETAMINOPHEN 650 MG RE SUPP
650.0000 mg | Freq: Four times a day (QID) | RECTAL | Status: DC | PRN
  Administered 2021-07-20: 650 mg via RECTAL
  Filled 2021-07-20: qty 1

## 2021-07-20 MED ORDER — GUAIFENESIN 100 MG/5ML PO LIQD
5.0000 mL | ORAL | Status: DC | PRN
  Filled 2021-07-20: qty 5

## 2021-07-20 MED ORDER — METRONIDAZOLE 500 MG/100ML IV SOLN
500.0000 mg | Freq: Three times a day (TID) | INTRAVENOUS | Status: DC
  Administered 2021-07-20 – 2021-07-22 (×7): 500 mg via INTRAVENOUS
  Filled 2021-07-20 (×7): qty 100

## 2021-07-20 MED ORDER — SODIUM CHLORIDE 0.9 % IV SOLN: INTRAVENOUS | Status: DC

## 2021-07-20 MED ORDER — SCOPOLAMINE 1 MG/3DAYS TD PT72
1.0000 | MEDICATED_PATCH | TRANSDERMAL | Status: DC
  Administered 2021-07-20 – 2021-07-26 (×3): 1.5 mg via TRANSDERMAL
  Filled 2021-07-20 (×3): qty 1

## 2021-07-20 MED ORDER — SODIUM CHLORIDE 0.9 % IV BOLUS: 1000.0000 mL | Freq: Once | INTRAVENOUS | Status: DC

## 2021-07-20 NOTE — Progress Notes (Signed)
Pharmacy Antibiotic Note  Megan Miranda is a 60 y.o. female admitted on 07/19/2021 with sepsis.  Pharmacy has been consulted for Vancomycin/Cefepime dosing. WBC 11. Noted renal dysfunction. Broadening anti-biotic coverage with worsening hypotension overnight.   Plan: Vancomycin 1000 mg IV q48h >>>Estimated AUC: 554 Cefepime 2g IV q24h Flagyl per MD Trend WBC, temp, renal function  F/U infectious work-up Drug levels as indicated   Height: 5\' 5"  (165.1 cm) Weight: 52.3 kg (115 lb 4.8 oz) IBW/kg (Calculated) : 57  Temp (24hrs), Avg:98.3 F (36.8 C), Min:98 F (36.7 C), Max:98.6 F (37 C)  Recent Labs  Lab 07/17/21 0208 07/17/21 1444 07/18/21 0117 07/19/21 0049 07/19/21 1122 07/19/21 2241  WBC 13.2* 13.1* 13.3* 11.1*  --  11.0*  CREATININE 3.21* 2.90* 2.91* 2.61*  --  2.24*  LATICACIDVEN 2.2* 1.4 1.8 2.3* 4.0* 2.8*    Estimated Creatinine Clearance: 21.8 mL/min (A) (by C-G formula based on SCr of 2.24 mg/dL (H)).    Allergies  Allergen Reactions   Bee Venom Itching   Soap Rash    Narda Bonds, PharmD, BCPS Clinical Pharmacist Phone: [phone]

## 2021-07-20 NOTE — Plan of Care (Signed)
°  Problem: Education: Goal: Knowledge of General Education information will improve Description: Including pain rating scale, medication(s)/side effects and non-pharmacologic comfort measures Outcome: Not Progressing   Problem: Health Behavior/Discharge Planning: Goal: Ability to manage health-related needs will improve Outcome: Not Progressing   Problem: Clinical Measurements: Goal: Ability to maintain clinical measurements within normal limits will improve Outcome: Progressing Goal: Will remain free from infection Outcome: Progressing Goal: Diagnostic test results will improve Outcome: Progressing Goal: Respiratory complications will improve Outcome: Progressing Goal: Cardiovascular complication will be avoided Outcome: Progressing   Problem: Activity: Goal: Risk for activity intolerance will decrease Outcome: Not Progressing   Problem: Nutrition: Goal: Adequate nutrition will be maintained Outcome: Not Progressing   Problem: Coping: Goal: Level of anxiety will decrease Outcome: Progressing   Problem: Elimination: Goal: Will not experience complications related to bowel motility Outcome: Progressing Goal: Will not experience complications related to urinary retention Outcome: Progressing   Problem: Pain Managment: Goal: General experience of comfort will improve Outcome: Progressing   Problem: Safety: Goal: Ability to remain free from injury will improve Outcome: Progressing   Problem: Skin Integrity: Goal: Risk for impaired skin integrity will decrease Outcome: Progressing   

## 2021-07-20 NOTE — Progress Notes (Signed)
Pt is lethargic whole day. BP is soft whole day, but better compared to yesterday, morning meds hold as pt not opening her mouth and was coughing. Dr.Sendil informed. One time Neb treatment given and pt felt some better on her coughing. IV bolus times 1 given. IV fluids and antibiotics continue, will continue to monitor the patient  Palma Holter, RN

## 2021-07-20 NOTE — Plan of Care (Signed)
°  Problem: Education: Goal: Knowledge of General Education information will improve Description: Including pain rating scale, medication(s)/side effects and non-pharmacologic comfort measures Outcome: Progressing   Problem: Health Behavior/Discharge Planning: Goal: Ability to manage health-related needs will improve Outcome: Progressing   Problem: Clinical Measurements: Goal: Diagnostic test results will improve Outcome: Progressing   Problem: Nutrition: Goal: Adequate nutrition will be maintained Outcome: Progressing   Problem: Coping: Goal: Level of anxiety will decrease Outcome: Progressing   Problem: Pain Managment: Goal: General experience of comfort will improve Outcome: Progressing   Problem: Safety: Goal: Ability to remain free from injury will improve Outcome: Progressing   Problem: Skin Integrity: Goal: Risk for impaired skin integrity will decrease Outcome: Progressing   

## 2021-07-20 NOTE — Progress Notes (Signed)
[facility] Progress Note  Patient: Megan Miranda    TML:[id number]  DOA: 07/02/2021    Date of Service: the patient was seen and examined on 07/20/2021  Brief hospital course: 61 year old female with past medical history of bipolar disorder, stage IIIb chronic kidney disease, recently found liver lesion suspicious for metastatic disease of unknown origin sent over from her skilled nursing facility on 07/13/2021 for increased confusion and admitted for sepsis secondary to UTI as well as patient also found to have COVID.  Started on IV fluids, antibiotics and close monitoring.  Following hospitalization, patient has responded to antibiotics.  She has become more alert although remains hypernatremic, so being treated with D5W.  By 2/25, white blood cell count, creatinine, sodium and procalcitonin all improving although lactic acid level continued to trend upward, secondary to intravascular volume depletion.  By 2/26, sodium, creatinine, white blood cell count and lactic acid will be medically improved.  Procalcitonin trending downward.  Patient continued to have episodes of hypotension.  Assessment and Plan: Assessment and Plan: * Severe sepsis ([place])- (present on admission) Patient meets criteria for severe sepsis secondary to UTI given tachycardia, leukocytosis, lactic acidosis, persistent hypotension and urinary source.  Continue antibiotics, monitor blood and urine cultures.  Initial lacid level improved.  Blood cultures with no growth to date.  Lactic acid level elevated again, but this time likely from intravascular volume depletion.  She may also be aspirating on secretions so added scopolamine patch.  see below.  UTI (urinary tract infection)- (present on admission) Urine culture is unremarkable.  Hypernatremia- (present on admission) Due to dehydration and poor p.o. intake.  Despite half-normal saline, so changed over to D5W and this time, sodium starting to improve.  Sodium is finally  normalized by 2/25 and fluids changed over to normal saline  Acute renal failure superimposed on stage 3b chronic kidney disease ([place])- (present on admission) Minimal improvement with IV fluids with half-normal saline.  Responded to D5W, now normal saline.  Creatinine continues to improve, currently at 2.08  COVID- (present on admission) CRP stable for now with minimal elevation.  Infection/sepsis looks to be more related to her urinary tract infection.  Protein-calorie malnutrition, severe ([place])- (present on admission) Nutrition to see.  BMI 19.19.  Given her confusion, for now she is being kept n.p.o. as per speech therapy recommendations.  Anemia due to chronic kidney disease- (present on admission) Slightly lower than baseline.  Stable no evidence of bleeding.  Bipolar I disorder ([place])- (present on admission) Continue Lamictal.  Have stopped Zyprexa as the side effect of hypotension       Body mass index is 19.19 kg/m.  Nutrition Problem: Severe Malnutrition Etiology: chronic illness (liver lesion, CKD IIIb)     Consultants: None  Procedures: None  Antimicrobials: IV Rocephin 2/22-2/25 IV cefepime and vancomycin 2/24-present  Code Status: DNR   Subjective: Remains less than responsive  Objective: Noted mild tachycardia Vitals:   07/20/21 1018 07/20/21 1200  BP:  (!) 93/52  Pulse: (!) 122 (!) 119  Resp: (!) 30 20  Temp: 98.5 F (36.9 C) 98.5 F (36.9 C)  SpO2: 91% 93%    Intake/Output Summary (Last 24 hours) at 07/20/2021 1445 Last data filed at 07/20/2021 0800 Gross per 24 hour  Intake 2159.98 ml  Output 2000 ml  Net 159.98 ml    Filed Weights   07/02/2021 1245 07/06/2021 2123  Weight: 57.2 kg 52.3 kg   Body mass index is 19.19 kg/m.  Exam:  General:  Continued decreased level of responsiveness HEENT: Normocephalic, atraumatic, mucous membranes are dry Cardiovascular: Regular rhythm, tachycardia Respiratory: Clear to auscultation  bilaterally Abdomen: Soft, nontender?,  Nondistended, hypoactive bowel sounds Musculoskeletal: No clubbing or cyanosis, trace pitting edema Skin: No skin breaks, tears or lesions Psychiatry: History of bipolar and schizoaffective disorder. Neurology: No focal deficits  Data Reviewed: Noted noted improving lab work in face of hypertension  Disposition:  Status is: Inpatient Remains inpatient appropriate because: Still acutely ill and needs for further medical stabilization.    Family Communication: Patient's brother is currently in [place] and will be back in a week.  Communication through a mutual friend, one of the local surgeons.  DVT Prophylaxis: heparin injection 5,000 Units Start: 06/28/2021 1700    Author: Annita Brod ,MD 07/20/2021 2:45 PM  To reach On-call, see care teams to locate the attending and reach out via [email or web address]. Between 7PM-7AM, please contact night-coverage If you still have difficulty reaching the attending provider, please page the [facility] (Director on Call) for [facility] on amion for assistance.

## 2021-07-20 NOTE — Progress Notes (Signed)
Manufacturing engineer [facility]) Hospital Liaison Note   Referral received for patient/family interest in [facility].    Patient has tested positive for COVID. Unfortunately, we can not take her at [facility] until she has completed her 10 day isolation period.   Chart is under review by [facility] physician and determination for inpatient hospice will be made closer to when she is off of isolation.    Please call with any questions or concerns.   Thank you.  Clementeen Hoof, BSN, RN [facility] Liaison  [phone]

## 2021-07-21 DIAGNOSIS — C787 Secondary malignant neoplasm of liver and intrahepatic bile duct: Secondary | ICD-10-CM | POA: Diagnosis not present

## 2021-07-21 DIAGNOSIS — E870 Hyperosmolality and hypernatremia: Secondary | ICD-10-CM | POA: Diagnosis not present

## 2021-07-21 DIAGNOSIS — A419 Sepsis, unspecified organism: Secondary | ICD-10-CM | POA: Diagnosis not present

## 2021-07-21 DIAGNOSIS — N179 Acute kidney failure, unspecified: Secondary | ICD-10-CM | POA: Diagnosis not present

## 2021-07-21 LAB — URINE CULTURE: Culture: NO GROWTH

## 2021-07-21 LAB — BASIC METABOLIC PANEL
Anion gap: 13 (ref 5–15)
BUN: 22 mg/dL (ref 8–23)
CO2: 14 mmol/L — ABNORMAL LOW (ref 22–32)
Calcium: 8 mg/dL — ABNORMAL LOW (ref 8.9–10.3)
Chloride: 130 mmol/L — ABNORMAL HIGH (ref 98–111)
Creatinine, Ser: 2.13 mg/dL — ABNORMAL HIGH (ref 0.44–1.00)
GFR, Estimated: 26 mL/min — ABNORMAL LOW (ref >60)
Glucose, Bld: 83 mg/dL (ref 70–99)
Potassium: 3.8 mmol/L (ref 3.5–5.1)
Sodium: 157 mmol/L — ABNORMAL HIGH (ref 135–145)

## 2021-07-21 LAB — FERRITIN: Ferritin: 918 ng/mL — ABNORMAL HIGH (ref 11–307)

## 2021-07-21 LAB — MAGNESIUM: Magnesium: 1.7 mg/dL (ref 1.7–2.4)

## 2021-07-21 LAB — PHOSPHORUS: Phosphorus: 3.6 mg/dL (ref 2.5–4.6)

## 2021-07-21 LAB — LACTIC ACID, PLASMA: Lactic Acid, Venous: 1.8 mmol/L (ref 0.5–1.9)

## 2021-07-21 LAB — C-REACTIVE PROTEIN: CRP: 19 mg/dL — ABNORMAL HIGH (ref <1.0)

## 2021-07-21 LAB — PROCALCITONIN: Procalcitonin: 3.08 ng/mL

## 2021-07-21 LAB — D-DIMER, QUANTITATIVE: D-Dimer, Quant: 10.74 ug/mL-FEU — ABNORMAL HIGH (ref 0.00–0.50)

## 2021-07-21 NOTE — Progress Notes (Signed)
SLP Cancellation Note  Patient Details Name: Megan Miranda MRN: [id number] DOB: 08-24-1960   Cancelled treatment:       Reason Eval/Treat Not Completed: Other (comment) Touched base with RN who says that pt is cognitively unchanged. She attempted to offer a straw earlier and pt not able to drink from it. She also shares that MD plans to have further conversation with family about [place] and recommends holding for today. Will continue to check in pending GOC.     Osie Bond., M.A. [place] [facility] Pager [phone] Office [phone]  07/21/2021, 12:34 PM

## 2021-07-21 NOTE — Assessment & Plan Note (Signed)
As mentioned above, despite maximal efforts with aggressive treating of infection, patient continues to worsen.  She is at a point now she is aspirating on secretions and I am concerned about prolonging her suffering unnecessarily.  Patient's brother has been out of the country, but returns back to [facility] and will discuss with him about making patient full comfort care.

## 2021-07-21 NOTE — Progress Notes (Signed)
[facility] Progress Note  Patient: Megan Miranda    TOI:[id number]  DOA: 06/25/2021    Date of Service: the patient was seen and examined on 07/21/2021  Brief hospital course: 61 year old female with past medical history of bipolar disorder, stage IIIb chronic kidney disease, recently found liver lesion suspicious for metastatic disease of unknown origin sent over from her skilled nursing facility on 07/04/2021 for increased confusion and admitted for sepsis secondary to UTI as well as patient also found to have COVID.  Started on IV fluids, antibiotics and close monitoring.  Following hospitalization, patient has responded to antibiotics.  She has become more alert although remains hypernatremic, so being treated with D5W.  By 2/25, white blood cell count, creatinine, sodium and procalcitonin all improving although lactic acid level continued to trend upward, secondary to intravascular volume depletion.  By 2/26, sodium, creatinine, white blood cell count and lactic acid all noted improvement, but patient maintained persistent hypotension and spiked fevers.  Antibiotics changed to cover practically all organisms, but by 2/27, sodium up to 157 with creatinine starting to trend back upward slightly and procalcitonin strongly increased from 0.92-3.08.  Patient felt to be aspirating on her secretions at this point, it is felt that we have maximized medical care short of intubation and ventilator, and are now considering comfort care.  Assessment and Plan: Assessment and Plan: * Severe sepsis ([place])- (present on admission) Patient meets criteria for severe sepsis secondary to UTI given tachycardia, leukocytosis, lactic acidosis, persistent hypotension and urinary source.  Continue antibiotics, monitor blood and urine cultures.  Initial lacid level improved.  Blood cultures with no growth to date.  Lactic acid level elevated again, initially thought to be intravascular volume depletion, but with persistent  fever and increased procalcitonin suspect she is now aspirating on secretions.  UTI (urinary tract infection)- (present on admission) Urine culture is unremarkable.  Hypernatremia- (present on admission) Due to dehydration and poor p.o. intake.  Despite half-normal saline, so changed over to D5W and this time, sodium starting to improve.  Sodium is finally normalized by 2/25 and fluids changed over to normal saline and sodium has started to trend upward quickly  Acute renal failure superimposed on stage 3b chronic kidney disease ([place])- (present on admission) Minimal improvement with IV fluids with half-normal saline.  Responded to D5W, now normal saline.  Creatinine as low as 2.08 and has slowly started to increase again.  COVID- (present on admission) CRP stable for now with minimal elevation.  Infection/sepsis looks to be more related to her urinary tract infection.  That said, hospice facility unable to take patient until 3/8 due to [place] isolation.  I expect if patient is made comfort care she will likely pass before that  Protein-calorie malnutrition, severe ([place])- (present on admission) Nutrition to see.  BMI 19.19.  Given her confusion, for now she is being kept n.p.o. as per speech therapy recommendations.  She should now be comfort care.  Anemia due to chronic kidney disease- (present on admission) Slightly lower than baseline.  Stable no evidence of bleeding.  Bipolar I disorder ([place])- (present on admission) Continue Lamictal.  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  Goals of care, counseling/discussion As mentioned above, despite maximal efforts with aggressive treating of infection, patient continues to worsen.  She is at a point now  she is aspirating on secretions and I am concerned about prolonging her suffering unnecessarily.  Patient's brother has been out of the country, but returns back to [facility] and will discuss with him about making patient full comfort care.       Body mass index is 19.19 kg/m.  Nutrition Problem: Severe Malnutrition Etiology: chronic illness (liver lesion, CKD IIIb)     Consultants: None  Procedures: None  Antimicrobials: IV Rocephin 2/22-2/24 IV cefepime and vancomycin 2/24-present IV Flagyl 2/25-present  Code Status: DNR   Subjective: Remains less than responsive  Objective: Noted mild tachycardia Vitals:   07/21/21 1151 07/21/21 1602  BP: 131/80 129/72  Pulse:  (!) 118  Resp:  (!) 29  Temp: 98.5 F (36.9 C) 98.8 F (37.1 C)  SpO2:  97%    Intake/Output Summary (Last 24 hours) at 07/21/2021 1621 Last data filed at 07/21/2021 1604 Gross per 24 hour  Intake 1855.37 ml  Output 3600 ml  Net -1744.63 ml    Filed Weights   07/05/2021 1245 07/04/2021 2123  Weight: 57.2 kg 52.3 kg   Body mass index is 19.19 kg/m.  Exam:  General: Awake, but does not respond to voice or commands. HEENT: Normocephalic, atraumatic, mucous membranes are dry Cardiovascular: Regular rhythm, tachycardia Respiratory: Clear to auscultation bilaterally, some work of breathing Abdomen: Soft, nontender?,  Nondistended, hypoactive bowel sounds Musculoskeletal: No clubbing or cyanosis, trace pitting edema Skin: No skin breaks, tears or lesions Psychiatry: History of bipolar and schizoaffective disorder. Neurology: No focal deficits  Data Reviewed: Noted noted improving lab work in face of hypertension  Disposition:  Status is: Inpatient Remains inpatient appropriate because: At this point, need to address further goals of care    Family Communication: Patient's brother is currently in [place] and will be back in a week.  Communication through a mutual friend, one of  the local surgeons.  DVT Prophylaxis: heparin injection 5,000 Units Start: 06/27/2021 1700    Author: Annita Brod ,MD 07/21/2021 4:21 PM  To reach On-call, see care teams to locate the attending and reach out via [email or web address]. Between 7PM-7AM, please contact night-coverage If you still have difficulty reaching the attending provider, please page the [facility] (Director on Call) for [facility] on amion for assistance.

## 2021-07-21 NOTE — Plan of Care (Signed)
°  Problem: Education: Goal: Knowledge of General Education information will improve Description: Including pain rating scale, medication(s)/side effects and non-pharmacologic comfort measures Outcome: Not Progressing   Problem: Health Behavior/Discharge Planning: Goal: Ability to manage health-related needs will improve Outcome: Not Progressing   Problem: Clinical Measurements: Goal: Ability to maintain clinical measurements within normal limits will improve Outcome: Not Progressing Goal: Will remain free from infection Outcome: Not Progressing   

## 2021-07-21 NOTE — Assessment & Plan Note (Signed)
Underlying issue which prior to this hospitalization with looking at comfort care options before.

## 2021-07-22 DIAGNOSIS — N179 Acute kidney failure, unspecified: Secondary | ICD-10-CM | POA: Diagnosis not present

## 2021-07-22 DIAGNOSIS — Z515 Encounter for palliative care: Secondary | ICD-10-CM

## 2021-07-22 DIAGNOSIS — Z7189 Other specified counseling: Secondary | ICD-10-CM | POA: Diagnosis not present

## 2021-07-22 DIAGNOSIS — A419 Sepsis, unspecified organism: Secondary | ICD-10-CM | POA: Diagnosis not present

## 2021-07-22 DIAGNOSIS — R6520 Severe sepsis without septic shock: Secondary | ICD-10-CM | POA: Diagnosis not present

## 2021-07-22 DIAGNOSIS — U071 COVID-19: Secondary | ICD-10-CM | POA: Diagnosis not present

## 2021-07-22 LAB — MAGNESIUM: Magnesium: 2 mg/dL (ref 1.7–2.4)

## 2021-07-22 LAB — COMPREHENSIVE METABOLIC PANEL
ALT: 23 U/L (ref 0–44)
AST: 55 U/L — ABNORMAL HIGH (ref 15–41)
Albumin: 1.6 g/dL — ABNORMAL LOW (ref 3.5–5.0)
Alkaline Phosphatase: 253 U/L — ABNORMAL HIGH (ref 38–126)
BUN: 26 mg/dL — ABNORMAL HIGH (ref 8–23)
CO2: 16 mmol/L — ABNORMAL LOW (ref 22–32)
Calcium: 8.5 mg/dL — ABNORMAL LOW (ref 8.9–10.3)
Chloride: >130 mmol/L (ref 98–111)
Creatinine, Ser: 2.6 mg/dL — ABNORMAL HIGH (ref 0.44–1.00)
GFR, Estimated: 20 mL/min — ABNORMAL LOW (ref >60)
Glucose, Bld: 129 mg/dL — ABNORMAL HIGH (ref 70–99)
Potassium: 4.2 mmol/L (ref 3.5–5.1)
Sodium: 172 mmol/L (ref 135–145)
Total Bilirubin: 0.5 mg/dL (ref 0.3–1.2)
Total Protein: 5.3 g/dL — ABNORMAL LOW (ref 6.5–8.1)

## 2021-07-22 LAB — CULTURE, BLOOD (ROUTINE X 2)
Culture: NO GROWTH
Special Requests: ADEQUATE

## 2021-07-22 LAB — CBC WITH DIFFERENTIAL/PLATELET
Abs Immature Granulocytes: 0.18 10*3/uL — ABNORMAL HIGH (ref 0.00–0.07)
Basophils Absolute: 0 10*3/uL (ref 0.0–0.1)
Basophils Relative: 0 %
Eosinophils Absolute: 0 10*3/uL (ref 0.0–0.5)
Eosinophils Relative: 0 %
HCT: 27.4 % — ABNORMAL LOW (ref 36.0–46.0)
Hemoglobin: 7.7 g/dL — ABNORMAL LOW (ref 12.0–15.0)
Immature Granulocytes: 1 %
Lymphocytes Relative: 10 %
Lymphs Abs: 1.4 10*3/uL (ref 0.7–4.0)
MCH: 26.4 pg (ref 26.0–34.0)
MCHC: 28.1 g/dL — ABNORMAL LOW (ref 30.0–36.0)
MCV: 93.8 fL (ref 80.0–100.0)
Monocytes Absolute: 0.6 10*3/uL (ref 0.1–1.0)
Monocytes Relative: 4 %
Neutro Abs: 11.8 10*3/uL — ABNORMAL HIGH (ref 1.7–7.7)
Neutrophils Relative %: 85 %
Platelets: 174 10*3/uL (ref 150–400)
RBC: 2.92 MIL/uL — ABNORMAL LOW (ref 3.87–5.11)
RDW: 19.9 % — ABNORMAL HIGH (ref 11.5–15.5)
WBC: 14 10*3/uL — ABNORMAL HIGH (ref 4.0–10.5)
nRBC: 0 % (ref 0.0–0.2)

## 2021-07-22 MED ORDER — ONDANSETRON 4 MG PO TBDP: 4.0000 mg | ORAL_TABLET | Freq: Four times a day (QID) | ORAL | Status: DC | PRN

## 2021-07-22 MED ORDER — HYDROMORPHONE HCL 1 MG/ML IJ SOLN: 0.5000 mg | INTRAMUSCULAR | Status: DC | PRN

## 2021-07-22 MED ORDER — ONDANSETRON HCL 4 MG/2ML IJ SOLN: 4.0000 mg | Freq: Four times a day (QID) | INTRAMUSCULAR | Status: DC | PRN

## 2021-07-22 MED ORDER — DEXTROSE 5 % IV SOLN: INTRAVENOUS | Status: DC

## 2021-07-22 MED ORDER — GLYCOPYRROLATE 1 MG PO TABS
1.0000 mg | ORAL_TABLET | ORAL | Status: DC | PRN
  Filled 2021-07-22: qty 1

## 2021-07-22 MED ORDER — GLYCOPYRROLATE 0.2 MG/ML IJ SOLN: 0.2000 mg | INTRAMUSCULAR | Status: DC | PRN

## 2021-07-22 MED ORDER — HALOPERIDOL 0.5 MG PO TABS
0.5000 mg | ORAL_TABLET | ORAL | Status: DC | PRN
  Filled 2021-07-22: qty 1

## 2021-07-22 MED ORDER — HALOPERIDOL LACTATE 2 MG/ML PO CONC
0.5000 mg | ORAL | Status: DC | PRN
  Filled 2021-07-22: qty 0.3

## 2021-07-22 MED ORDER — POLYVINYL ALCOHOL 1.4 % OP SOLN: 1.0000 [drp] | Freq: Four times a day (QID) | OPHTHALMIC | Status: DC | PRN

## 2021-07-22 MED ORDER — BIOTENE DRY MOUTH MT LIQD: 15.0000 mL | OROMUCOSAL | Status: DC | PRN

## 2021-07-22 MED ORDER — HALOPERIDOL LACTATE 5 MG/ML IJ SOLN: 0.5000 mg | INTRAMUSCULAR | Status: DC | PRN

## 2021-07-22 NOTE — Progress Notes (Incomplete)
Lab called with critical values: Na+ of 177 and chloride >130. MD paged, awaiting response. RN continuing to monitor.

## 2021-07-22 NOTE — Consult Note (Signed)
Consultation Note Date: 07/22/2021   Patient Name: Megan Miranda  DOB: 04-Jan-1961  MRN: [id number]  Age / Sex: 61 y.o., female  PCP: Patient, No Pcp Per (Inactive) Referring Physician: Darliss Cheney, MD  Reason for Consultation:  terminal care  HPI/Patient Profile: 61 y.o. female  with past medical history of IIB chronic kidney disease, liver lesions suspicious for malignancy, bipolar disorder admitted on 07/19/2021 from SNF with increasing confusion. Workup reveals sepsis d/t UTI and Covid +. She had initial clinical response to IV fluids and antibiotics, however, quickly decompensated with increasing procalcitonin, poor mental status, aspirating even on her won secretions. Medical care was maximized and per discussion with attending team- her brother agreed with transitioning to full comfort measures only and requesting hospice house.   Primary Decision Maker NEXT OF KIN- brother  Discussion: Chart reviewed including labs, progress notes and imaging. She has a MOST form on file that was completed with our team in January of this year indicating- DNR, no antibiotics, no IV fluids, and no feeding tube.  I called her brother Brandon Melnick.  We reviewed her status and desire for comfort interventions only.  Unfortunately, she is not likely to survive until her quarantine time is over that Hospice house is requiring for admission. He is in agreement with keeping her in the hospital and providing end of life care without escalation. He notes that this was his preference when she previously hospitalized- per chart review she went to SNF due to no qualifying at that time for residential hospice.     SUMMARY OF RECOMMENDATIONS -Transition to full comfort care only -Hydromophone .5mg  q15 min IV for SOB or any signs of discomfort (avoid morphine d/t CKD) -robinul .38mcg q4hr prn for secretions, agree with scop already  ordered -d/c all medications, labs, and other interventions not contributing to comfort -request transfer to 6N unit for comfort care -PMT will continue to follow and assist with end of life care     Code Status/Advance Care Planning: DNR   Prognosis:   Hours - Days  Discharge Planning: Anticipated Hospital Death  Primary Diagnoses: Present on Admission:  Severe sepsis ([place])  Bipolar I disorder ([place])  Acute renal failure superimposed on stage 3b chronic kidney disease ([place])  COVID  Anemia due to chronic kidney disease  Hypernatremia  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   IO: Intake/output summary:  Intake/Output Summary (Last 24 hours) at 07/22/2021 1504  Last data filed at 07/22/2021 1100 Gross per 24 hour  Intake 3481.46 ml  Output 1100 ml  Net 2381.46 ml    LBM:   Baseline Weight: Weight: 57.2 kg Most recent weight: Weight: 52.3 kg     Palliative Assessment/Data: 10%       Thank you for this consult. Palliative medicine will continue to follow and assist as needed.    Signed by: Mariana Kaufman, AGNP-C Palliative Medicine    Please contact Palliative Medicine Team phone at [phone] for questions and concerns.  For individual provider: See Shea Evans

## 2021-07-22 NOTE — Progress Notes (Signed)
Nutrition Brief Note ° °Chart reviewed. °Pt now transitioning to comfort care.  °No further nutrition interventions planned at this time.  °Please re-consult as needed. ° ° °Kate Roderica Cathell, MS, RD, LDN °Inpatient Clinical Dietitian °Please see AMiON for contact information. ° ° °

## 2021-07-22 NOTE — Progress Notes (Addendum)
[facility] Progress Note  Patient: Megan Miranda    DZH:[id number]  DOA: 06/29/2021    Date of Service: the patient was seen and examined on 07/22/2021  Brief hospital course: 61 year old female with past medical history of bipolar disorder, stage IIIb chronic kidney disease, recently found liver lesion suspicious for metastatic disease of unknown origin sent over from her skilled nursing facility on 07/13/2021 for increased confusion and admitted for sepsis secondary to UTI as well as patient also found to have COVID.  Started on IV fluids, antibiotics and close monitoring.  Following hospitalization, patient has responded to antibiotics.  She has become more alert although remains hypernatremic, so being treated with D5W.  By 2/25, white blood cell count, creatinine, sodium and procalcitonin all improving although lactic acid level continued to trend upward, secondary to intravascular volume depletion.  By 2/26, sodium, creatinine, white blood cell count and lactic acid all noted improvement, but patient maintained persistent hypotension and spiked fevers.  Antibiotics changed to cover practically all organisms, but by 2/27, sodium up to 157 with creatinine starting to trend back upward slightly and procalcitonin strongly increased from 0.92-3.08.  Patient felt to be aspirating on her secretions at this point, it is felt that we have maximized medical care short of intubation and ventilator, and are now considering comfort care.  Assessment and Plan: Assessment and Plan: * Severe sepsis ([place]) secondary to UTI- (present on admission) Patient meets criteria for severe sepsis secondary to UTI given tachycardia, leukocytosis, lactic acidosis, persistent hypotension and urinary source.  Continue antibiotics, monitor blood and urine cultures.  Initial lacid level improved.  Blood and urine cultures with no growth to date.  Lactic acid level [place] again but then normalized.  Although she is afebrile but has  worsening of leukocytosis and overall she seems to be worsening and I agree with my colleague Dr. Maryland Pink that she is declining very very fast.  Although she is alert but she is confused.  We will continue same antibiotics for now.  Protein-calorie malnutrition, severe ([place])- (present on admission) Nutrition to see.  BMI 19.19.  Given her confusion, for now she is being kept n.p.o. as per speech therapy recommendations.  She should now be comfort care.  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  Hypernatremia- (present on admission) Hard to find the source, unfortunately, her sodium is rapidly rising to and currently it is 172.  She is on normal saline for some reason.  I will switch her to dextrose 5% at 100 cc/h and monitor sodium every 6 hours.  Fortunately, she is alert.  Anemia due to chronic kidney disease- (present on admission) Slightly lower than baseline.  Stable no evidence of bleeding.  COVID- (present on admission) CRP stable for now with minimal elevation.  Infection/sepsis looks to be more related to her urinary tract infection.  That said, hospice facility unable to take patient until 3/8 due to [place] isolation.  I expect if patient is made comfort care she will likely pass before that.   Goals of care, counseling/discussion As mentioned above, despite maximal efforts with aggressive treating of infection, patient continues to worsen.  She is at a point now she is aspirating on secretions and I am concerned about prolonging her suffering unnecessarily.  Patient's brother has been out of the country, but reportedly returns back to [employer] .  I tried calling  him as well and I have left a voicemail for him to call back.  I have also consulted palliative care.  Addendum 12:40 PM: Patient's brother Mr. Henrene Pastor showed up.  We  had a candid discussion about patient's situation.  [name] was very understanding and accepting of her situation.  He wanted her to be comfortable and wanted [place] to switch her to comfort care and transition her to hospice facility when we are able to.  We also discussed and he agreed to discontinuing IV fluids and IV antibiotics as well.  Acute renal failure superimposed on stage 3b chronic kidney disease ([place])- (present on admission) Minimal improvement with IV fluids with half-normal saline.  Creatinine rising, currently 2.60.  Monitor closely.  Bipolar I disorder ([place])- (present on admission) Continue Lamictal.  Have stopped Zyprexa as the side effect of hypotension which does seem to have helped slightly  Body mass index is 19.19 kg/m.  Nutrition Problem: Severe Malnutrition Etiology: chronic illness (liver lesion, CKD IIIb)     Consultants: None  Procedures: None  Antimicrobials: IV Rocephin 2/22-2/24 IV cefepime and vancomycin 2/24-present IV Flagyl 2/25-present  Code Status: DNR   Subjective: Patient seen and examined.  She is alert but she is not oriented at all.  She looks comfortable.  Objective: Noted mild tachycardia Vitals:   07/22/21 0400 07/22/21 0800  BP: 135/81 125/65  Pulse: (!) 101 (!) 116  Resp: 19 (!) 22  Temp:    SpO2: 97% 97%    Intake/Output Summary (Last 24 hours) at 07/22/2021 1143 Last data filed at 07/22/2021 0500 Gross per 24 hour  Intake 3541.46 ml  Output 1500 ml  Net 2041.46 ml    Filed Weights   07/17/2021 1245 07/09/2021 2123  Weight: 57.2 kg 52.3 kg   Body mass index is 19.19 kg/m.  Exam: General exam: Appears calm and comfortable  Respiratory system: Clear to auscultation. Respiratory effort normal. Cardiovascular system: S1 & S2 heard, sinus tachycardia. No JVD, murmurs, rubs, gallops or clicks. No pedal edema. Gastrointestinal system: Abdomen is nondistended, soft and nontender. No organomegaly or masses felt. Normal bowel sounds  heard. Central nervous system: Alert but not oriented.  No focal deficit.  Data Reviewed: Noted noted improving lab work in face of hypertension  Disposition:  Status is: Inpatient Remains inpatient appropriate because: At this point, need to address further goals of care    Family Communication: Patient's brother is currently in [place] and will be back tonight.  Left a voicemail to him.    DVT Prophylaxis: heparin injection 5,000 Units Start: 07/15/2021 1700    Author: Darliss Cheney ,MD 07/22/2021 11:43 AM  To reach On-call, see care teams to locate the attending and reach out via [email or web address]. Between 7PM-7AM, please contact night-coverage If you still have difficulty reaching the attending provider, please page the [facility] (Director on Call) for [facility] on amion for assistance.

## 2021-07-22 NOTE — Progress Notes (Signed)
SLP Cancellation Note  Patient Details Name: Megan Miranda MRN: [id number] DOB: 1960-07-02   Cancelled treatment:       Reason Eval/Treat Not Completed: Other (comment) Pt is now comfort care. POs could be offered at family discretion or per pt request for comfort, but her mentation so far has limited PO intake. Would focus on oral care for comfort. SLP to sign off acutely but please reorder if we can be of further assistance.     Osie Bond., M.A. [facility] Pager [phone] Office [phone]  07/22/2021, 2:04 PM

## 2021-07-23 DIAGNOSIS — N1832 Chronic kidney disease, stage 3b: Secondary | ICD-10-CM | POA: Diagnosis not present

## 2021-07-23 DIAGNOSIS — U071 COVID-19: Secondary | ICD-10-CM | POA: Diagnosis not present

## 2021-07-23 DIAGNOSIS — N179 Acute kidney failure, unspecified: Secondary | ICD-10-CM | POA: Diagnosis not present

## 2021-07-23 DIAGNOSIS — R6520 Severe sepsis without septic shock: Secondary | ICD-10-CM | POA: Diagnosis not present

## 2021-07-23 DIAGNOSIS — A419 Sepsis, unspecified organism: Secondary | ICD-10-CM | POA: Diagnosis not present

## 2021-07-23 MED ORDER — LORAZEPAM 2 MG/ML IJ SOLN
0.5000 mg | Freq: Four times a day (QID) | INTRAMUSCULAR | Status: DC
  Administered 2021-07-23 – 2021-07-25 (×9): 0.5 mg via INTRAVENOUS
  Filled 2021-07-23 (×10): qty 1

## 2021-07-23 NOTE — Progress Notes (Signed)
? ?                                                                                                                                                     ?                                                   ?Daily Progress Note  ? ?Patient Name: Megan Miranda       Date: 07/23/2021 ?DOB: Jan 08, 1961  Age: 61 y.o. MRN#: [id number] ?Attending Physician: Darliss Cheney, MD ?Primary Care Physician: Patient, No Pcp Per (Inactive) ?Admit Date: 07/01/2021 ? ?Reason for Consultation/Follow-up: Terminal Care ? ?Patient Profile/HPI:  61 y.o. female  with past medical history of IIB chronic kidney disease, liver lesions suspicious for malignancy, bipolar disorder admitted on 07/15/2021 from SNF with increasing confusion. Workup reveals sepsis d/t UTI and Covid +. She had initial clinical response to IV fluids and antibiotics, however, quickly decompensated with increasing procalcitonin, poor mental status, aspirating even on her won secretions. Medical care was maximized and per discussion with attending team- her brother agreed with transitioning to full comfort measures only and requesting hospice house.  ? ?Subjective: ?Megan Miranda is more awake than yesterday, however, she does not interact with the world around her. She is chattering incoherently nonstop. Tapping her feet. Arms and hands are contracted.  ?Her brother, Megan Miranda, is at bedside. He notes that her current state is much different from when he last saw her 2 weeks ago. At that time she was able to carry on a coherent conversation, she was not contracted.  ?He continues to endorse full comfort measures only and seeking hospice placement.  ? ?Review of Systems  ?Unable to perform ROS: Acuity of condition   ? ? ?Physical Exam ?Vitals and nursing note reviewed.  ?Constitutional:   ?   Appearance: She is ill-appearing.  ?   Comments: Frail, cachetic  ?HENT:  ?   Mouth/Throat:  ?   Comments: Poor dentition ?Neurological:  ?   Comments: incoherent  ?         ? ?Vital Signs: BP 106/69 (BP  Location: Right Arm)   Pulse (!) 111   Temp 98.5 ?F (36.9 ?C) (Oral)   Resp 15   Ht 5\' 5"  (1.651 m)   Wt 52.3 kg   SpO2 100%   BMI 19.19 kg/m?  ?SpO2: SpO2: 100 % ?O2 Device: O2 Device: Room Air ?O2 Flow Rate:   ? ?Intake/output summary:  ?Intake/Output Summary (Last 24 hours) at 07/23/2021 1509 ?Last data filed at 07/23/2021 6378 ?Gross per 24 hour  ?Intake 582.12 ml  ?Output 550 ml  ?Net 32.12 ml  ? ?LBM:   ?  Baseline Weight: Weight: 57.2 kg ?Most recent weight: Weight: 52.3 kg ? ?     ?Palliative Assessment/Data: PPS: 20% ? ? ? ? ? ?Patient Active Problem List  ? Diagnosis Date Noted  ? 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 UTI (urinary tract infection) 07/18/2021  ? Protein-calorie malnutrition, severe ([place]) 07/18/2021  ? Anemia due to chronic kidney disease 07/17/2021  ? Hypernatremia 07/17/2021  ? Severe sepsis ([place]) 07/14/2021  ? Altered mental status   ? COVID   ? Goals of care, counseling/discussion 06/01/2021  ? Acute urinary retention 06/01/2021  ? Delirium due to another medical condition 05/16/2021  ? Generalized weakness 05/15/2021  ? AKI (acute kidney injury) ([place]) 05/15/2021  ? Acute renal failure superimposed on stage 3b chronic kidney disease ([place]) 05/14/2021  ? Schizoaffective disorder, manic type ([place]) 12/03/2018  ? SIRS (systemic inflammatory response syndrome) ([place]) 10/16/2017  ? Lactic acidosis 10/16/2017  ? Abnormal TSH 10/16/2017  ? History of alcohol abuse 10/16/2017  ? Lithium toxicity 10/15/2017  ? Bipolar I disorder ([place]) 07/21/2007  ? ? ?Palliative Care Assessment & Plan  ? ? ?Assessment/Recommendations/Plan ? ?Continue plan for comfort measures only ?Anxiety, agitation- I am concerned about increased anxiety with patient unable to take her lamictal- discussed with her brother who is also concerned and wishes to limit suffering in any way- start lorazepam .5mg  q6hr IV for anxiety/agitation ? ? ?Code Status: ?DNR ? ?Prognosis: ? < 2  weeks ? ?Discharge Planning: ?Hospice facility ? ?Care plan was discussed with patient's brother and care team.  ? ?Thank you for allowing the Palliative Medicine Team to assist in the care of this patient. ? ? ?Mariana Kaufman, AGNP-C ?Palliative Medicine ? ? ?Please contact Palliative Medicine Team phone at [phone] for questions and concerns.  ? ? ? ? ? ? ?

## 2021-07-23 NOTE — Progress Notes (Signed)
[facility] Progress Note ? ?Patient: Megan Miranda    ZWC:[id number]  DOA: 07/21/2021    ?Date of Service: the patient was seen and examined on 07/23/2021 ? ?Brief hospital course: ?61 year old female with past medical history of bipolar disorder, stage IIIb chronic kidney disease, recently found liver lesion suspicious for metastatic disease of unknown origin sent over from her skilled nursing facility on 07/10/2021 for increased confusion and admitted for sepsis secondary to UTI as well as patient also found to have COVID.  Started on IV fluids, antibiotics and close monitoring. ? ?Following hospitalization, patient has responded to antibiotics.  She had become more alert, initially hypernatremia improved but then got worse.  She also continued to have worsening renal function and leukocytosis although she remained afebrile.  This happened despite of doing all the medical measures.  Her prognosis was seems to be very poor.  Eventually her brother was visiting [place] arrived on the afternoon of 07/22/2021 and after mutual agreement, patient was placed on comfort care and palliative care was consulted.   ? 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  Continue antibiotics, monitor blood and urine cultures.  Initial lacid level improved.  Blood and urine cultures with no growth to date.  Lactic acid level Rose again but then normalized.  Poor prognosis.  Patient was transitioned to full comfort care after having discussion with the brother on the afternoon of 07/22/2021.  Palliative care on board.  Patient alert but not  oriented like yesterday.  Plan to transfer to hospice facility on 07/30/2021, 10 days post COVID-positive test. ? ?Acute renal failure superimposed on stage 3b chronic kidney disease ([place])- (present on admission) ?Minimal improvement even with IV fluids.  Currently full comfort care. ? ?Bipolar I disorder ([place])- (present on admission) ?Comfort care. ? ?Body mass index is 19.19 kg/m?Marland Kitchen  ?Nutrition Problem: Severe Malnutrition ?Etiology: chronic illness (liver lesion, CKD IIIb) ?   ? ?Consultants: ?None ? ?Procedures: ?None ? ?Antimicrobials: ?IV Rocephin 2/22-2/24 ?IV cefepime and vancomycin 2/24-present ?IV Flagyl 2/25-present ? ?Code Status: DNR ? ? ?Subjective: Seen and examined.  She is alert but not oriented.  ? ?Objective: ?Noted mild tachycardia ?Vitals:  ? 07/22/21 2044 07/23/21 0813  ?BP: 107/80 106/69  ?Pulse: (!) 119 (!) 111  ?Resp: (!) 26 15  ?Temp:  98.5 ?F (36.9 ?C)  ?SpO2: 90% 100%  ? ? ?Intake/Output Summary (Last 24 hours) at 07/23/2021 1307 ?Last data filed at 07/23/2021 2353 ?Gross per 24 hour  ?Intake 582.12 ml  ?Output 550 ml  ?Net 32.12 ml  ? ? ?Filed Weights  ? 07/12/2021 1245 07/20/2021 2123  ?Weight: 57.2 kg 52.3 kg  ? ?Body mass index is 19.19 kg/m?. ? ?Exam: ? ?General exam: Appears calm and comfortable  ?Respiratory system: Clear to auscultation. Respiratory effort normal. ?Cardiovascular system: S1 & S2 heard, RRR. No JVD, murmurs, rubs, gallops or clicks. No pedal edema. ?Gastrointestinal system: Abdomen is nondistended, soft and nontender. No organomegaly or masses felt. Normal bowel sounds heard. ?Central nervous system: Alert but not oriented. ? ?Data Reviewed: ?Noted noted improving lab work in face of hypertension ? ?Disposition:  ?Status is: Inpatient ?Remains inpatient appropriate because: At this point, need to address further goals of care ? ? ? ?  Family Communication: Discussed with brother on 07/22/2021. ? ?DVT Prophylaxis: Comfort care ? ? ? ? ?Author: ?Darliss Cheney ,MD ?07/23/2021 1:07  PM ? ?To reach On-call, see care teams to locate the attending and reach out via [email or web address]. ?Between 7PM-7AM, please contact night-coverage ?If you still have difficulty reaching the attending provider, please page the [facility] (Director on Call) for [facility] on amion for assistance. ? ?

## 2021-07-23 DEATH — deceased

## 2021-07-24 DIAGNOSIS — A419 Sepsis, unspecified organism: Secondary | ICD-10-CM | POA: Diagnosis not present

## 2021-07-24 DIAGNOSIS — N179 Acute kidney failure, unspecified: Secondary | ICD-10-CM | POA: Diagnosis not present

## 2021-07-24 DIAGNOSIS — R6520 Severe sepsis without septic shock: Secondary | ICD-10-CM | POA: Diagnosis not present

## 2021-07-24 DIAGNOSIS — R4182 Altered mental status, unspecified: Secondary | ICD-10-CM | POA: Diagnosis not present

## 2021-07-24 DIAGNOSIS — Z7189 Other specified counseling: Secondary | ICD-10-CM | POA: Diagnosis not present

## 2021-07-24 LAB — CULTURE, BLOOD (ROUTINE X 2)
Culture: NO GROWTH
Culture: NO GROWTH

## 2021-07-24 MED ORDER — HYDROMORPHONE HCL 1 MG/ML IJ SOLN
0.5000 mg | INTRAMUSCULAR | Status: DC
  Administered 2021-07-24 – 2021-07-27 (×17): 0.5 mg via INTRAVENOUS
  Filled 2021-07-24 (×17): qty 0.5

## 2021-07-24 MED ORDER — BIOTENE DRY MOUTH MT LIQD
15.0000 mL | Freq: Three times a day (TID) | OROMUCOSAL | Status: DC
  Administered 2021-07-24 – 2021-07-27 (×10): 15 mL via TOPICAL

## 2021-07-24 NOTE — Progress Notes (Signed)
[facility]) Hospital Liaison Note ?  ?Referral received for patient/family interest in [facility].  ?  ?Patient has tested positive for COVID on 2.22.23  ? ?Unfortunately, we can not take her at [facility] until she has completed her 10 day isolation period which will be 3/4. If eligible she will be able to transfer depending on bed availability.  ?  ?Chart is under review by [facility] physician and determination for inpatient hospice will be made closer to when she is off of isolation.  ?  ?Please call with any questions or concerns. Thank you ? ?Roselee Nova, LCSW ?[facility] Liaison  ?[phone] ?

## 2021-07-24 NOTE — Progress Notes (Signed)
?                                                   ?Daily Progress Note  ? ?Patient Name: Megan Miranda       Date: 07/24/2021 ?DOB: February 28, 1961  Age: 61 y.o. MRN#: [id number] ?Attending Physician: Darliss Cheney, MD ?Primary Care Physician: Patient, No Pcp Per (Inactive) ?Admit Date: 07/19/2021 ? ?Reason for Consultation/Follow-up: Terminal [facility] ? ?Subjective: ?Patient with eyes open but unable to verbalize. ? ?Length of Stay: 8 ? ?Current Medications: ?Scheduled Meds:  ? antiseptic oral rinse  15 mL Topical TID  ?  HYDROmorphone (DILAUDID) injection  0.5 mg Intravenous Q4H  ? lamoTRIgine  200 mg Oral Daily  ? LORazepam  0.5 mg Intravenous Q6H  ? scopolamine  1 patch Transdermal Q72H  ? ? ?Continuous Infusions: ? ? ?PRN Meds: ?acetaminophen, glycopyrrolate **OR** glycopyrrolate **OR** glycopyrrolate, haloperidol **OR** haloperidol **OR** haloperidol lactate, HYDROmorphone (DILAUDID) injection, ondansetron **OR** ondansetron (ZOFRAN) IV, polyvinyl alcohol, white petrolatum ? ?Physical Exam ?Constitutional:   ?   General: She is not in acute distress. ?   Appearance: She is ill-appearing.  ?   Comments: Appears uncomfortable - muscles tense, hands in fists, grimacing, furrowed brow  ?Pulmonary:  ?   Effort: Pulmonary effort is normal.  ?Skin: ?   General: Skin is warm and dry.  ?Neurological:  ?   Mental Status: She is alert. She is disoriented.  ?         ? ?Vital Signs: BP 112/63 (BP Location: Left Arm)   Pulse (!) 107   Temp 97.7 ?F (36.5 ?C) (Oral)   Resp 20   Ht 5\' 5"  (1.651 m)   Wt 52.3 kg   SpO2 100%   BMI 19.19 kg/m?  ?SpO2: SpO2: 100 % ?O2 Device: O2 Device: Room Air ?O2 Flow Rate:   ? ?Intake/output summary:  ?Intake/Output Summary (Last 24 hours) at 07/24/2021 1149 ?Last data filed at 07/24/2021 0410 ?Gross per 24 hour  ?Intake --  ?Output 400 ml  ?Net -400 ml  ? ?LBM:    ?Baseline Weight: Weight: 57.2 kg ?Most recent weight: Weight: 52.3 kg ? ?     ?Palliative Assessment/Data: PPS 10% ? ? ? ? ? ?Patient Active Problem List  ? Diagnosis Date Noted  ? Metastatic carcinoma involving liver with unknown primary site [facility]) 07/18/2021  ? UTI (urinary tract infection) 07/18/2021  ? Protein-calorie malnutrition, severe ([place]) 07/18/2021  ? Anemia due to chronic kidney disease 07/17/2021  ? Hypernatremia 07/17/2021  ? Severe sepsis ([place]) 07/03/2021  ? Altered mental status   ? COVID   ? Goals of care, counseling/discussion 06/01/2021  ? Acute urinary retention 06/01/2021  ? Delirium due to another medical condition 05/16/2021  ? Generalized weakness 05/15/2021  ? AKI (acute kidney injury) ([place]) 05/15/2021  ? Acute renal failure superimposed on stage 3b chronic kidney disease ([place]) 05/14/2021  ? Schizoaffective disorder, manic type ([place]) 12/03/2018  ? SIRS (systemic inflammatory response syndrome) ([place]) 10/16/2017  ? Lactic acidosis 10/16/2017  ? Abnormal TSH 10/16/2017  ? History of alcohol abuse 10/16/2017  ? Lithium toxicity 10/15/2017  ? Bipolar I disorder ([place]) 07/21/2007  ? ? ?Palliative Care Assessment & Plan  ? ?HPI: ?61 y.o. female  with past medical history of IIB chronic kidney disease, liver lesions suspicious for malignancy, bipolar disorder  admitted on 06/25/2021 from SNF with increasing confusion. Workup reveals sepsis d/t UTI and Covid +. She had initial clinical response to IV fluids and antibiotics, however, quickly decompensated with increasing procalcitonin, poor mental status, aspirating even on her won secretions. Medical care was maximized and per discussion with attending team- her brother agreed with transitioning to full comfort measures only and requesting hospice house.  ? ?Assessment: ?Patient appears uncomfortable with furrowed brow and tense body language.  Patient's eyes are open but not focusing on me.  She does not respond to my questions.  RN reports inability  to give p.o. medications and no p.o. intake.  Scheduled hydromorphone every 4 hours.  Continue scheduled Ativan.  Discussed symptom management adjustments with RN. ? ?Recommendations/Plan: ?Continue comfort measures only ?Add scheduled hydromorphone, continue scheduled Ativan ?Continue as needed Robinul and hydromorphone ?Plan to transfer to hospice facility once patient has completed COVID isolation ? ?Goals of Care and Additional Recommendations: ?Limitations on Scope of Treatment: Full Comfort Care ? ?Code Status: ?DNR ? ?Prognosis: ? Hours - Days ? ?Discharge Planning: ?Hospice facility ? ?Care plan was discussed with RN ? ?Thank you for allowing the Palliative Medicine Team to assist in the care of this patient. ? ?Juel Burrow, DNP, AGNP-C ?Palliative Medicine Team ?Team Phone # [phone]  ?Pager [phone] ? ?

## 2021-07-24 NOTE — Progress Notes (Signed)
[facility] Progress Note ? ?Patient: Megan Miranda    RCV:[id number]  DOA: 06/27/2021    ?Date of Service: the patient was seen and examined on 07/24/2021 ? ?Brief hospital course: ?61 year old female with past medical history of bipolar disorder, stage IIIb chronic kidney disease, recently found liver lesion suspicious for metastatic disease of unknown origin sent over from her skilled nursing facility on 06/29/2021 for increased confusion and admitted for sepsis secondary to UTI as well as patient also found to have COVID.  Started on IV fluids, antibiotics and close monitoring. ? ?Following hospitalization, patient has responded to antibiotics.  She had become more alert, initially hypernatremia improved but then got worse.  She also continued to have worsening renal function and leukocytosis although she remained afebrile.  This happened despite of doing all the medical measures.  Her prognosis was seems to be very poor.  Eventually her brother was visiting [place] arrived on the afternoon of 07/22/2021 and after mutual agreement, patient was placed on comfort care and palliative care was consulted.   ? 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  Continue antibiotics, monitor blood and urine cultures.  Initial lacid level improved.  Blood and urine cultures with no growth to date.  Lactic acid level Rose again but then normalized.  Poor prognosis.  Patient was transitioned to full comfort care after having discussion with the brother on the afternoon of 07/22/2021.  Palliative care on board.  Patient alert but not  oriented like yesterday.  Plan to transfer to hospice facility on 07/26/2021 if bed available, 10 days post COVID-positive test.  She appears comfortable. ? ?Acute renal failure superimposed on stage 3b chronic kidney disease ([place])- (present on admission) ?Minimal improvement even with IV fluids.  Currently full comfort care. ? ?Bipolar I disorder ([place])- (present on admission) ?Comfort care. ? ?Body mass index is 19.19 kg/m?Marland Kitchen  ?Nutrition Problem: Severe Malnutrition ?Etiology: chronic illness (liver lesion, CKD IIIb) ?   ? ?Consultants: ?None ? ?Procedures: ?None ? ?Antimicrobials: ?IV Rocephin 2/22-2/24 ?IV cefepime and vancomycin 2/24-present ?IV Flagyl 2/25-present ? ?Code Status: DNR ? ? ?Subjective: Seen and examined.  Alert but not oriented as usual.  No change compared to yesterday. ? ?Objective: ?Noted mild tachycardia ?Vitals:  ? 07/23/21 2236 07/24/21 0406  ?BP: 106/67 112/63  ?Pulse: (!) 107 (!) 107  ?Resp:  20  ?Temp: 98.2 ?F (36.8 ?C) 97.7 ?F (36.5 ?C)  ?SpO2: 100%   ? ? ?Intake/Output Summary (Last 24 hours) at 07/24/2021 1334 ?Last data filed at 07/24/2021 0410 ?Gross per 24 hour  ?Intake --  ?Output 400 ml  ?Net -400 ml  ? ? ?Filed Weights  ? 07/02/2021 1245 06/28/2021 2123  ?Weight: 57.2 kg 52.3 kg  ? ?Body mass index is 19.19 kg/m?. ? ?Exam: ? ?General exam: Appears calm and comfortable  ?Respiratory system: Clear to auscultation. Respiratory effort normal. ?Cardiovascular system: S1 & S2 heard, RRR. No JVD, murmurs, rubs, gallops or clicks. No pedal edema. ?Gastrointestinal system: Abdomen is nondistended, soft and nontender. No organomegaly or masses felt. Normal bowel sounds heard. ?Central nervous system: Alert but not oriented. ?Extremities: Symmetric 5 x 5 power. ? ? ?Data Reviewed: ?Noted noted improving lab work in face of hypertension ? ?  Disposition:  ?Status is: Inpatient ?Remains inpatient appropriate because: At this point, need to address further goals of care ? ? ? ?Family Communication:  Discussed with brother on 07/22/2021. ? ?DVT Prophylaxis: Comfort care ? ? ? ? ?Author: ?Darliss Cheney ,MD ?07/24/2021 1:34 PM ? ?To reach On-call, see care teams to locate the attending and reach out via [email or web address]. ?Between 7PM-7AM, please contact night-coverage ?If you still have difficulty reaching the attending provider, please page the [facility] (Director on Call) for [facility] on amion for assistance. ? ?

## 2021-07-24 NOTE — Progress Notes (Signed)
Unable to get patient to [facility] for PO meds. Lamictal not given. Shaffer,NP made aware ?

## 2021-07-25 DIAGNOSIS — R6520 Severe sepsis without septic shock: Secondary | ICD-10-CM | POA: Diagnosis not present

## 2021-07-25 DIAGNOSIS — A419 Sepsis, unspecified organism: Secondary | ICD-10-CM | POA: Diagnosis not present

## 2021-07-25 NOTE — Progress Notes (Addendum)
Patient ID:Megan Miranda      DOB: 1961/04/18      JFH:[id number] ? ? ?Palliative Medicine Team at [facility] ?Progress Note ? ? ? ?Subjective: Follow up symptom check for PMT provider.  ? ? ?Physical exam: Patient warm and dry. Strong radial pulses, heart rate 86 bpm, respiratory rate 14/minute. No physiological signs displayed of pain or discomfort.  ? ? ?Assessment and plan: Patient resting comfortably in bed, no distress noted. Briefly opened eyes when spoken to but returned to resting. No needs indicated per bedside RN.  ? ?Thank you for allowing the Palliative Medicine Team to assist in the care of this patient. ? ? ?Damian Leavell, MSN, RN ?Palliative Medicine Team ?[phone] ?

## 2021-07-25 NOTE — Progress Notes (Signed)
[facility] Progress Note ? ?Patient: Megan Miranda    ZHY:[id number]  DOA: 07/05/2021    ?Date of Service: the patient was seen and examined on 07/25/2021 ? ?Brief hospital course: ?61 year old female with past medical history of bipolar disorder, stage IIIb chronic kidney disease, recently found liver lesion suspicious for metastatic disease of unknown origin sent over from her skilled nursing facility on 07/21/2021 for increased confusion and admitted for sepsis secondary to UTI as well as patient also found to have COVID.  Started on IV fluids, antibiotics and close monitoring. ? ?Following hospitalization, patient has responded to antibiotics.  She had become more alert, initially hypernatremia improved but then got worse.  She also continued to have worsening renal function and leukocytosis although she remained afebrile.  This happened despite of doing all the medical measures.  Her prognosis was seems to be very poor.  Eventually her brother was visiting [place] arrived on the afternoon of 07/22/2021 and after mutual agreement, patient was placed on comfort care and palliative care was consulted.   ? 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  Initial lacid level improved.  Blood and urine cultures with no growth to date.  Lactic acid level Rose again but then normalized.  Poor prognosis.  Patient was transitioned to full comfort care after having discussion with the brother on the afternoon of 07/22/2021.  Palliative care on board.  More lethargic today but appears comfortable.  Patient plan to transfer to  hospice facility on 07/26/2021 if bed available, 10 days post COVID-positive test.  She appears comfortable. ? ?Acute renal failure superimposed on stage 3b chronic kidney disease ([place])- (present on admission) ? Currently full comfort care. ? ?Bipolar I disorder ([place])- (present on admission) ?Comfort care. ? ?Body mass index is 19.19 kg/m?Marland Kitchen  ?Nutrition Problem: Severe Malnutrition ?Etiology: chronic illness (liver lesion, CKD IIIb) ?   ? ?Consultants: ?None ? ?Procedures: ?None ? ?Antimicrobials: ?IV Rocephin 2/22-2/24 ?IV cefepime and vancomycin 2/24-present ?IV Flagyl 2/25-present ? ?Code Status: DNR ? ? ?Subjective: Seen and examined.  Patient's brother was at the bedside.  Patient appears comfortable. ? ?Objective: ?Noted mild tachycardia ?Vitals:  ? 07/24/21 2100 07/25/21 0437  ?BP: (!) 74/57 (!) 72/48  ?Pulse: 94 69  ?Resp: 16 18  ?Temp: 98 ?F (36.7 ?C) (!) 97.5 ?F (36.4 ?C)  ?SpO2:  90%  ? ? ?Intake/Output Summary (Last 24 hours) at 07/25/2021 1318 ?Last data filed at 07/24/2021 2205 ?Gross per 24 hour  ?Intake --  ?Output 200 ml  ?Net -200 ml  ? ? ?Filed Weights  ? 06/29/2021 1245 07/02/2021 2123  ?Weight: 57.2 kg 52.3 kg  ? ?Body mass index is 19.19 kg/m?. ? ?Exam: ? ?General exam: Appears lethargic but comfortable. ?Respiratory system: Clear to auscultation. Respiratory effort normal. ?Cardiovascular system: S1 & S2 heard, RRR. No JVD, murmurs, rubs, gallops or clicks. No pedal edema. ?Gastrointestinal system: Abdomen is nondistended, soft and nontender. No organomegaly or masses felt. Normal bowel sounds heard. ? ?Data Reviewed: ?Noted noted improving lab work in face of hypertension ? ?Disposition:  ?Status is: Inpatient ?Remains inpatient appropriate because: At this point, need to address further goals of care ? ? ? ?Family Communication: Discussed with brother on 07/22/2021. ? ?  DVT Prophylaxis: Comfort care ? ? ? ? ?Author: ?Darliss Cheney ,MD ?07/25/2021 1:18 PM ? ?To reach On-call, see care teams to locate the  attending and reach out via [email or web address]. ?Between 7PM-7AM, please contact night-coverage ?If you still have difficulty reaching the attending provider, please page the [facility] (Director on Call) for [facility] on amion for assistance. ? ?

## 2021-07-26 DIAGNOSIS — R6520 Severe sepsis without septic shock: Secondary | ICD-10-CM | POA: Diagnosis not present

## 2021-07-26 DIAGNOSIS — A419 Sepsis, unspecified organism: Secondary | ICD-10-CM | POA: Diagnosis not present

## 2021-07-26 DIAGNOSIS — Z515 Encounter for palliative care: Secondary | ICD-10-CM | POA: Diagnosis not present

## 2021-07-26 NOTE — Progress Notes (Signed)
[facility] Progress Note ? ?Patient: CANYON WILLOW    KWI:[id number]  DOA: 06/26/2021    ?Date of Service: the patient was seen and examined on 07/26/2021 ? ?Brief hospital course: ?61 year old female with past medical history of bipolar disorder, stage IIIb chronic kidney disease, recently found liver lesion suspicious for metastatic disease of unknown origin sent over from her skilled nursing facility on 06/25/2021 for increased confusion and admitted for sepsis secondary to UTI as well as patient also found to have COVID.  Started on IV fluids, antibiotics and close monitoring. ? ?Following hospitalization, patient has responded to antibiotics.  She had become more alert, initially hypernatremia improved but then got worse.  She also continued to have worsening renal function and leukocytosis although she remained afebrile.  This happened despite of doing all the medical measures.  Her prognosis was seems to be very poor.  Eventually her brother was visiting [place] arrived on the afternoon of 07/22/2021 and after mutual agreement, patient was placed on comfort care and palliative care was consulted.   ? 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  Initial lacid level improved.  Blood and urine cultures with no growth to date.  Lactic acid level Rose again but then normalized.  Poor prognosis.  Patient was transitioned to full comfort care after having discussion with the brother on the afternoon of 07/22/2021.  Palliative care on board.  Patient getting more lethargic every day but appears to be comfortable.   Anticipate hospital death. ? ?Acute renal failure superimposed on stage 3b chronic kidney disease ([place])- (present on admission) ? Currently full comfort care. ? ?Bipolar I disorder ([place])- (present on admission) ?Comfort care. ? ?Body mass index is 19.19 kg/m?Marland Kitchen  ?Nutrition Problem: Severe Malnutrition ?Etiology: chronic illness (liver lesion, CKD IIIb) ?   ? ?Consultants: ?None ? ?Procedures: ?None ? ?Antimicrobials: ?IV Rocephin 2/22-2/24 ?IV cefepime and vancomycin 2/24-present ?IV Flagyl 2/25-present ? ?Code Status: DNR ? ? ?Subjective: Seen and examined.  Lethargic but arousable.  Appears comfortable. ? ?Objective: ?Noted mild tachycardia ?Vitals:  ? 07/25/21 2237 07/26/21 0939  ?BP: (!) 70/45 (!) 67/43  ?Pulse: 75 78  ?Resp: 19 20  ?Temp: 97.8 ?F (36.6 ?C) 98.4 ?F (36.9 ?C)  ?SpO2: 92% 90%  ? ?No intake or output data in the 24 hours ending 07/26/21 1340 ? ?Filed Weights  ? 07/07/2021 1245 07/21/2021 2123  ?Weight: 57.2 kg 52.3 kg  ? ?Body mass index is 19.19 kg/m?. ? ?Exam: ? ?General exam: Appears lethargic but comfortable  ?Respiratory system: Clear to auscultation. Respiratory effort normal. ?Cardiovascular system: S1 & S2 heard, RRR. No JVD, murmurs, rubs, gallops or clicks. No pedal edema. ?Gastrointestinal system: Abdomen is nondistended, soft and nontender. No organomegaly or masses felt. Normal bowel sounds heard. ? ?Data Reviewed: ?Noted noted improving lab work in face of hypertension ? ?Disposition:  ?Status is: Inpatient ?Remains inpatient appropriate because: At this point, need to address further goals of care ? ? ? ?Family Communication: Met with brother at bedside on 07/25/2021. ? ?DVT Prophylaxis: Comfort care ? ? ? ? ?Author: ?Darliss Cheney ,MD ?07/26/2021 1:40 PM ? ?To reach On-call, see care teams to locate the attending and reach out via [email or web address]. ?Between 7PM-7AM, please contact  night-coverage ?If you still have difficulty reaching the attending provider, please page the [facility] (Director on Call)  for [facility] on amion for assistance. ? ?

## 2021-07-26 NOTE — Progress Notes (Signed)
Palliative: ? ?HPI: 61 y.o. female  with past medical history of IIB chronic kidney disease, liver lesions suspicious for malignancy, bipolar disorder admitted on 06/25/2021 from SNF with increasing confusion. Workup reveals sepsis d/t UTI and Covid +. She had initial clinical response to IV fluids and antibiotics, however, quickly decompensated with increasing procalcitonin, poor mental status, aspirating even on her won secretions. Medical care was maximized and per discussion with attending team- her brother agreed with transitioning to full comfort measures only and hospice facility.  ? ?I met today at Ms. Lair's bedside. No family present. Discussed with RN who reports that she has remained comfortable with current medication regimen. Her blood pressure is critically low although extremities are warm. No signs of mottling. Her eyes do slightly open during my assessment and barely and her respiratory rate quickened temporarily. Discussed with RN and I feel she is slightly tenuous at this stage and more likely appropriate for hospital death. She is 10 days out from initial COVID + test and isolation discontinued after discussion with Dr. Doristine Bosworth.  ? ?Emotional support provided.  ? ?Exam: Minimally responsive. No purposeful responses. Eye opening very briefly but no tracking or verbalization. Breathing irregular, shallow. Extremities warm to touch, no mottling.  ? ?Plan: ?- No changes to comfort regimen.  ?- Anticipate hospital death.  ? ?25 min ? ?Vinie Sill, NP ?Palliative Medicine Team ?Pager [phone] (Please see [email or web address] for schedule) ?Team Phone [phone]  ? ? ?Greater than 50%  of this time was spent counseling and coordinating care related to the above assessment and plan   ?

## 2021-07-27 DIAGNOSIS — Z7189 Other specified counseling: Secondary | ICD-10-CM | POA: Diagnosis not present

## 2021-07-27 DIAGNOSIS — Z66 Do not resuscitate: Secondary | ICD-10-CM | POA: Diagnosis not present

## 2021-07-27 DIAGNOSIS — R6520 Severe sepsis without septic shock: Secondary | ICD-10-CM | POA: Diagnosis not present

## 2021-07-27 DIAGNOSIS — A419 Sepsis, unspecified organism: Secondary | ICD-10-CM | POA: Diagnosis not present

## 2021-07-27 DIAGNOSIS — Z515 Encounter for palliative care: Secondary | ICD-10-CM | POA: Diagnosis not present

## 2021-07-27 MED ORDER — LORAZEPAM 2 MG/ML IJ SOLN
1.0000 mg | Freq: Four times a day (QID) | INTRAMUSCULAR | Status: DC
  Administered 2021-07-27 (×3): 1 mg via INTRAVENOUS
  Filled 2021-07-27 (×3): qty 1

## 2021-07-27 MED ORDER — HYDROMORPHONE HCL 1 MG/ML IJ SOLN
1.0000 mg | INTRAMUSCULAR | Status: DC
  Administered 2021-07-27 (×4): 1 mg via INTRAVENOUS
  Filled 2021-07-27 (×4): qty 1

## 2021-08-23 NOTE — Progress Notes (Addendum)
CSW attempted to schedule PTAR for 10 PM tonight. CSW was told she could NOT schedule ahead. CSW was told that someone would have to call closer to 10 PM to arrange transportation. Medical necessity form completed and given to the nurse for transportation via [place] ?

## 2021-08-23 NOTE — Progress Notes (Addendum)
Manufacturing engineer [facility]) Hospital Liaison Note ? ?Bed offered and accepted for transfer today to [facility].  ? ?Unit RN please call report to [phone] prior to patient leaving the unit.  ? ?Please send signed DNR and paperwork with patient. Please call with any questions or concerns.  ? ?TOC, transport will need to be arranged for 10pm tonight.   ? ?Roselee Nova, LCSW ?[facility] Liaison ?[phone] ?

## 2021-08-23 NOTE — Progress Notes (Signed)
? ?  Palliative Medicine Inpatient Follow Up Note ? ? ?HPI: 61 y.o. female  with past medical history of IIB chronic kidney disease, liver lesions suspicious for malignancy, bipolar disorder admitted on 07/15/2021 from SNF with increasing confusion. Workup reveals sepsis d/t UTI and Covid +. She had initial clinical response to IV fluids and antibiotics, however, quickly decompensated with increasing procalcitonin, poor mental status, aspirating even on her won secretions. Medical care was maximized and per discussion with attending team- her brother agreed with transitioning to full comfort measures only and requesting hospice house.  ? ?Today's Discussion (Aug 18, 2021) ? ?*Please note that this is a verbal dictation therefore any spelling or grammatical errors are due to the "[place] [facility]" system interpretation. ? ?Chart reviewed inclusive of vital signs, progress notes, laboratory results, and diagnostic images.  ? ?I met with [place] at bedside this morning. She was lethargic but appeared to have a furrowing brow and her toes were anxiously ticking. I increased her ATC dilaudid and ativan in the setting of these symptoms.  ? ?I spoke to patients brother, [name] this morning and provided him an update. Created space and opportunity for patient to explore thoughts feelings and fears regarding current medical situation. He shares that he plans to come in this morning. ? ?Questions and concerns addressed  ? ?Palliative Support Provided ? ?Objective Assessment: ?Vital Signs ?Vitals:  ? 07/26/21 2206 Aug 18, 2021 0839  ?BP: (!) 68/42 (!) 68/43  ?Pulse: 76 (!) 109  ?Resp: 20 (!) 22  ?Temp: 98.6 ?F (37 ?C) 97.7 ?F (36.5 ?C)  ?SpO2: 90% 93%  ? ? ?Intake/Output Summary (Last 24 hours) at 2021/08/18 1011 ?Last data filed at 07/26/2021 1330 ?Gross per 24 hour  ?Intake 0 ml  ?Output --  ?Net 0 ml  ? ?Last Weight  Most recent update: 07/15/2021  9:32 PM  ? ? Weight  ?52.3 kg (115 lb 4.8 oz)  ?      ? ?  ? ?Constitutional:   ?   General: She  is not in acute distress. ?   Appearance: She is ill-appearing.  ?   Comments: Appears uncomfortable - muscles tense, hands in fists, grimacing, furrowed brow  ?Pulmonary:  ?   Effort: Pulmonary effort is normal.  ?Skin: ?   General: Skin is warm and dry.  ?Neurological:  ?   Mental Status: She is alert. She is disoriented ? ?SUMMARY OF RECOMMENDATIONS   ?DNAR/DNI ? ?Comfort measures only ? ?Increase dilaudid and ativan ? ?DC lamictal, cannot tolerate POs ? ?Unrestricted visitation ? ?Off covid isolation on 3/4 ? ?Prognosis Hours to days at the most ? ?MDM - High ? ?____________________________________________________________________________________ ?Tacey Ruiz ?[facility] Team ?Team Cell Phone: [phone] ?Please utilize secure chat with additional questions, if there is no response within 30 minutes please call the above phone number ? ?Palliative Medicine Team providers are available by phone from 7am to 7pm daily and can be reached through the team cell phone.  ?Should this patient require assistance outside of these hours, please call the patient's attending physician. ? ? ? ? ?

## 2021-08-23 NOTE — Progress Notes (Signed)
Pt expired comfortably on 2021/08/01 at 2225-08-11 in her hospital bed 6N23. Verified by Rozann Lesches, RN and Wyatt Haste, RN. Lovey Newcomer, NP informed. Pts legal guardian and brother, [name] notified at 08-11-33. Brother will not come to pt bedside anymore. No holds per [facility]. Pt prepared for transfer to morgue.  ? ? ? 01-Aug-2021 08/12/35  ?Attending Physican Contact  ?Attending Physician Notified Y  ?Attending Physician (First and Last Name) Lovey Newcomer, NP  ?Post Mortem Checklist  ?Date of Death August 01, 2021  ?Time of Death 08-11-2225  ?Pronounced By Rozann Lesches, RN & Wyatt Haste, RN  ?Next of kin notified Yes  ?Name of next of kin notified of death [name]  ?Contact Person's Relationship to Patient Brother  ?Contact Person's Phone Number [phone]  ?Was the patient a No Code Blue or a Limited Code Blue? Yes  ?Did the patient die unattended? No  ?Patient restrained? Not applicable  ?Height '5\' 5"'$  (1.651 m)  ?Weight 54.4 kg  ?Body preparation complete Y  ?[facility] (previously known as Brewing technologist)  ?Notification Date 08/01/2021  ?Notification Time Aug 11, 2245  ?[facility] Number [phone]  ?Is patient a potential donor? N  ?Autopsy  ?Autopsy requested by N/A  ?Patient and [place] Returned  ?Patient is satisfied that all belongings have been returned? Not applicable  ?Dermatherapy linen/gowns NOT sent with patient or transporter Not applicable  ?Dead on [place] (Emergency Department)  ?Patient dead on arrival? No  ?Notifications  ?Patient Placement notified that Post Mortem checklist is complete Yes  ?Patient Placement notified body transferred Transported to morgue  ?Medical Examiner  ?Is this a medical examiner's case? N  ?Funeral Home  ?Funeral home name/address/phone # [place] [phone]  ?Planned location of pickup Morgue  ? ? ?

## 2021-08-23 NOTE — Discharge Summary (Signed)
[facility] Discharge Summary  GLORYA BARTLEY DZH:[id number] DOB: 11-26-1960 DOA: 07/15/2021  PCP: Patient, No Pcp Per (Inactive)  Admit date: 07/18/2021 Discharge date: August 15, 2021 30 Day Unplanned Readmission Risk Score    Flowsheet Row ED to Hosp-Admission (Current) from 07/06/2021 in [facility]  SURGICAL  30 Day Unplanned Readmission Risk Score (%) 22.76 Filed at 08/15/2021 1600       This score is the patient's risk of an unplanned readmission within 30 days of being discharged (0 -100%). The score is based on dignosis, age, lab data, medications, orders, and past utilization.   Low:  0-14.9   Medium: 15-21.9   High: 22-29.9   Extreme: 30 and above          Admitted From: Home  Disposition:  [facility]  Recommendations for Outpatient Follow-up:  Follow up with PCP in 1-2 weeks Please obtain BMP/CBC in one week Please follow up with your PCP on the following pending results: Unresulted Labs (From admission, onward)    None       Discharge Condition:guarded CODE STATUS:DNR Diet recommendation: as pleased  Subjective:seen and examined. Alert but not communicating.   Brief/Interim Summary: 61 year old female with past medical history of bipolar disorder, stage IIIb chronic kidney disease, recently found liver lesion suspicious for metastatic disease of unknown origin sent over from her skilled nursing facility on 07/10/2021 for increased confusion and admitted for sepsis secondary to UTI as well as patient also found to have COVID.  Started on IV fluids, antibiotics and close monitoring.   Following hospitalization, patient had responded to antibiotics.  She had become more alert, initially hypernatremia improved but then got worse.  She also continued to have worsening renal function and leukocytosis although she remained afebrile.  This happened despite of doing all the medical measures.  Her prognosis seemed to be very poor.  Eventually her brother  , who was visiting [place], arrived on the afternoon of 07/22/2021 and after mutual agreement, patient was placed on comfort care and palliative care was consulted.     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  Initial lacid level improved.  Blood and urine cultures with no growth to date.  Lactic acid level Rose again but then normalized.  Poor prognosis.  Patient was transitioned to full comfort care after having discussion with the brother on the afternoon of 07/22/2021.  Palliative care on board. Patient has received a bed offer and will be discharged to [facility] tonight.   Acute renal failure superimposed on stage 3b chronic kidney disease ([place])- (present on admission)  Currently full comfort care.   Bipolar I disorder ([place])- (present on admission) Comfort care.  Discharge plan was discussed with patient and/or family member and they verbalized understanding and agreed with it.  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 [facility])   UTI (urinary tract infection)   Protein-calorie malnutrition, severe ([place])  Discharge Instructions  Discharge Instructions     Diet general   Complete by: As directed       Allergies as of August 09, 2021       Reactions   Bee Venom Itching   Soap  Rash        Medication List     STOP taking these medications    ferrous sulfate 325 (65 FE) MG tablet   lactulose 10 GM/15ML solution Commonly known as: CHRONULAC   lamoTRIgine 200 MG tablet Commonly known as: LaMICtal   loratadine 10 MG tablet Commonly known as: CLARITIN   LORazepam 1 MG tablet Commonly known as: ATIVAN   OLANZapine 20 MG tablet Commonly known as: ZYPREXA   tamsulosin 0.4 MG Caps capsule Commonly known as: FLOMAX        Allergies  Allergen Reactions   Bee Venom Itching   Soap Rash    Consultations: PALLIATIVE CARE   Procedures/Studies: DG Chest 1 View  Result Date: 07/20/2021 CLINICAL DATA:  Shortness of breath EXAM: CHEST  1 VIEW COMPARISON:  06/30/2021 FINDINGS: Left base atelectasis. Right lung clear. Heart is normal size. No effusions or acute bony abnormality. IMPRESSION: Left base atelectasis. Electronically Signed   By: Rolm Baptise M.D.   On: 07/20/2021 00:31   CT Head Wo Contrast  Result Date: 07/18/2021 CLINICAL DATA:  Altered mental status. EXAM: CT HEAD WITHOUT CONTRAST TECHNIQUE: Contiguous axial images were obtained from the base of the skull through the vertex without intravenous contrast. RADIATION DOSE REDUCTION: This exam was performed according to the departmental dose-optimization program which includes automated exposure control, adjustment of the mA and/or kV according to patient size and/or use of iterative reconstruction technique. COMPARISON:  CT head dated 06/23/2021. FINDINGS: Brain: No evidence of acute infarction, hemorrhage, hydrocephalus, extra-axial collection or mass lesion/mass effect. There is mild to moderate cerebral volume loss with associated ex vacuo dilatation. Periventricular white matter hypoattenuation likely represents chronic small vessel ischemic disease. Vascular: There are vascular calcifications in the carotid siphons. Skull: Normal. Negative for fracture or focal lesion. Sinuses/Orbits: No acute  finding. Other: None. IMPRESSION: No acute intracranial process. Electronically Signed   By: Zerita Boers M.D.   On: 07/21/2021 13:39   DG Chest Port 1 View  Result Date: 07/22/2021 CLINICAL DATA:  Provided history: Questionable sepsis. EXAM: PORTABLE CHEST 1 VIEW COMPARISON:  Prior chest radiographs 06/23/2021 and earlier. FINDINGS: A small portion of the right lung apex may be excluded from the field of view. Heart size within normal limits. No appreciable airspace consolidation. No evidence of pleural effusion or pneumothorax. No acute bony abnormality identified. Thoracic dextrocurvature. IMPRESSION: A small portion of the right lung apex may be excluded from the field of view. No evidence of acute cardiopulmonary abnormality. Electronically Signed   By: Kellie Simmering D.O.   On: 06/26/2021 13:13     Discharge Exam: Vitals:   07/26/21 2206 08-09-21 0839  BP: (!) 68/42 (!) 68/43  Pulse: 76 (!) 109  Resp: 20 (!) 22  Temp: 98.6 F (37 C) 97.7 F (36.5 C)  SpO2: 90% 93%   Vitals:   07/25/21 2237 07/26/21 0939 07/26/21 2206 08-09-21 0839  BP: (!) 70/45 (!) 67/43 (!) 68/42 (!) 68/43  Pulse: 75 78 76 (!) 109  Resp: '19 20 20 ' (!) 22  Temp: 97.8 F (36.6 C) 98.4 F (36.9 C) 98.6 F (37 C) 97.7 F (36.5 C)  TempSrc: Axillary Axillary Axillary Axillary  SpO2: 92% 90% 90% 93%  Weight:  Height:        General: Pt is alert, awake, not in acute distress Cardiovascular: RRR, S1/S2 +, no rubs, no gallops Respiratory: CTA bilaterally, no wheezing, no rhonchi Abdominal: Soft, NT, ND, bowel sounds + Extremities: no edema, no cyanosis    The results of significant diagnostics from this hospitalization (including imaging, microbiology, ancillary and laboratory) are listed below for reference.     Microbiology: Recent Results (from the past 240 hour(s))  Culture, blood (routine x 2)     Status: None   Collection Time: 07/19/21 10:41 PM   Specimen: BLOOD LEFT HAND  Result Value Ref  Range Status   Specimen Description BLOOD LEFT HAND  Final   Special Requests   Final    AEROBIC BOTTLE ONLY Blood Culture results may not be optimal due to an inadequate volume of blood received in culture bottles   Culture   Final    NO GROWTH 5 DAYS Performed at [facility], [place] [address], [place], [place] [address]    Report Status 07/24/2021 FINAL  Final  Culture, blood (routine x 2)     Status: None   Collection Time: 07/19/21 10:41 PM   Specimen: BLOOD RIGHT HAND  Result Value Ref Range Status   Specimen Description BLOOD RIGHT HAND  Final   Special Requests   Final    AEROBIC BOTTLE ONLY Blood Culture results may not be optimal due to an inadequate volume of blood received in culture bottles   Culture   Final    NO GROWTH 5 DAYS Performed at [facility], [place] [address], [place], [place] [address]    Report Status 07/24/2021 FINAL  Final  Urine Culture     Status: None   Collection Time: 07/20/21  1:53 AM   Specimen: Urine, Clean Catch  Result Value Ref Range Status   Specimen Description URINE, CLEAN CATCH  Final   Special Requests NONE  Final   Culture   Final    NO GROWTH Performed at [facility], [place] [address], [place], [place] [address]    Report Status 07/21/2021 FINAL  Final     Labs: BNP (last 3 results) Recent Labs    07/20/21 0555  BNP 08.6   Basic Metabolic Panel: Recent Labs  Lab 07/21/21 0102 07/22/21 0923  NA 157* 172*  K 3.8 4.2  CL 130* >130*  CO2 14* 16*  GLUCOSE 83 129*  BUN 22 26*  CREATININE 2.13* 2.60*  CALCIUM 8.0* 8.5*  MG 1.7 2.0  PHOS 3.6  --    Liver Function Tests: Recent Labs  Lab 07/22/21 0923  AST 55*  ALT 23  ALKPHOS 253*  BILITOT 0.5  PROT 5.3*  ALBUMIN 1.6*   No results for input(s): LIPASE, AMYLASE in the last 168 hours. No results for input(s): AMMONIA in the last 168 hours. CBC: Recent Labs  Lab 07/22/21 0923  WBC 14.0*  NEUTROABS 11.8*  HGB 7.7*  HCT 27.4*  MCV 93.8  PLT  174   Cardiac Enzymes: No results for input(s): CKTOTAL, CKMB, CKMBINDEX, TROPONINI in the last 168 hours. BNP: Invalid input(s): POCBNP CBG: No results for input(s): GLUCAP in the last 168 hours. D-Dimer No results for input(s): DDIMER in the last 72 hours. Hgb A1c No results for input(s): HGBA1C in the last 72 hours. Lipid Profile No results for input(s): CHOL, HDL, LDLCALC, TRIG, CHOLHDL, LDLDIRECT in the last 72 hours. Thyroid function studies No results for input(s):  TSH, T4TOTAL, T3FREE, THYROIDAB in the last 72 hours.  Invalid input(s): FREET3 Anemia work up No results for input(s): VITAMINB12, FOLATE, FERRITIN, TIBC, IRON, RETICCTPCT in the last 72 hours. Urinalysis    Component Value Date/Time   COLORURINE YELLOW 07/20/2021 0153   APPEARANCEUR HAZY (A) 07/20/2021 0153   LABSPEC 1.002 (L) 07/20/2021 0153   PHURINE 5.0 07/20/2021 0153   GLUCOSEU NEGATIVE 07/20/2021 0153   HGBUR SMALL (A) 07/20/2021 0153   BILIRUBINUR NEGATIVE 07/20/2021 0153   KETONESUR NEGATIVE 07/20/2021 0153   PROTEINUR NEGATIVE 07/20/2021 0153   NITRITE NEGATIVE 07/20/2021 0153   LEUKOCYTESUR LARGE (A) 07/20/2021 0153   Sepsis Labs Invalid input(s): PROCALCITONIN,  WBC,  LACTICIDVEN Microbiology Recent Results (from the past 240 hour(s))  Culture, blood (routine x 2)     Status: None   Collection Time: 07/19/21 10:41 PM   Specimen: BLOOD LEFT HAND  Result Value Ref Range Status   Specimen Description BLOOD LEFT HAND  Final   Special Requests   Final    AEROBIC BOTTLE ONLY Blood Culture results may not be optimal due to an inadequate volume of blood received in culture bottles   Culture   Final    NO GROWTH 5 DAYS Performed at [facility], [place] [address], [place], [place] [address]    Report Status 07/24/2021 FINAL  Final  Culture, blood (routine x 2)     Status: None   Collection Time: 07/19/21 10:41 PM   Specimen: BLOOD RIGHT HAND  Result Value Ref Range Status   Specimen  Description BLOOD RIGHT HAND  Final   Special Requests   Final    AEROBIC BOTTLE ONLY Blood Culture results may not be optimal due to an inadequate volume of blood received in culture bottles   Culture   Final    NO GROWTH 5 DAYS Performed at [facility], [place] [address] [place], [place] [address]    Report Status 07/24/2021 FINAL  Final  Urine Culture     Status: None   Collection Time: 07/20/21  1:53 AM   Specimen: Urine, Clean Catch  Result Value Ref Range Status   Specimen Description URINE, CLEAN CATCH  Final   Special Requests NONE  Final   Culture   Final    NO GROWTH Performed at [facility], [place] [address] [place], [place] [address]    Report Status 07/21/2021 FINAL  Final     Time coordinating discharge: Over 30 minutes  SIGNED:   Darliss Cheney, MD  [facility] 10-Aug-2021, 4:08 PM  If 7PM-7AM, please contact night-coverage [email or web address]

## 2021-08-23 NOTE — Progress Notes (Signed)
[facility] Progress Note ? ?Patient: Megan Miranda    ZPH:[id number]  DOA: 07/11/2021    ?Date of Service: the patient was seen and examined on 2021-08-24 ? ?Brief hospital course: ?61 year old female with past medical history of bipolar disorder, stage IIIb chronic kidney disease, recently found liver lesion suspicious for metastatic disease of unknown origin sent over from her skilled nursing facility on 06/25/2021 for increased confusion and admitted for sepsis secondary to UTI as well as patient also found to have COVID.  Started on IV fluids, antibiotics and close monitoring. ? ?Following hospitalization, patient has responded to antibiotics.  She had become more alert, initially hypernatremia improved but then got worse.  She also continued to have worsening renal function and leukocytosis although she remained afebrile.  This happened despite of doing all the medical measures.  Her prognosis was seems to be very poor.  Eventually her brother was visiting [place] arrived on the afternoon of 07/22/2021 and after mutual agreement, patient was placed on comfort care and palliative care was consulted.   ? 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  Initial lacid level improved.  Blood and urine cultures with no growth to date.  Lactic acid level Rose again but then normalized.  Poor prognosis.  Patient was transitioned to full comfort care after having discussion with the brother on the afternoon of 07/22/2021.  Palliative care on board.  Patient getting more lethargic every day but appears to be comfortable.   Anticipate hospital death. ? ?Acute renal failure superimposed on stage 3b chronic kidney disease ([place])- (present on admission) ? Currently full comfort care. ? ?Bipolar I disorder ([place])- (present on admission) ?Comfort care. ? ?Body mass index is 19.19 kg/m?Marland Kitchen  ?Nutrition Problem: Severe Malnutrition ?Etiology: chronic illness (liver lesion, CKD IIIb) ?   ? ?Consultants: ?None ? ?Procedures: ?None ? ?Antimicrobials: ?IV Rocephin 2/22-2/24 ?IV cefepime and vancomycin 2/24-present ?IV Flagyl 2/25-present ? ?Code Status: DNR ? ? ?Subjective: Seen and examined.  Awake but not talkative and appears to be tired but comfortable. ? ?Objective: ?Noted mild tachycardia ?Vitals:  ? 07/26/21 2206 August 24, 2021 0839  ?BP: (!) 68/42 (!) 68/43  ?Pulse: 76 (!) 109  ?Resp: 20 (!) 22  ?Temp: 98.6 ?F (37 ?C) 97.7 ?F (36.5 ?C)  ?SpO2: 90% 93%  ? ? ?Intake/Output Summary (Last 24 hours) at 2021-08-24 1056 ?Last data filed at 07/26/2021 1330 ?Gross per 24 hour  ?Intake 0 ml  ?Output --  ?Net 0 ml  ? ? ?Filed Weights  ? 07/07/2021 1245 07/14/2021 2123  ?Weight: 57.2 kg 52.3 kg  ? ?Body mass index is 19.19 kg/m?. ? ?Exam: ? ?General exam: Appears calm and comfortable but tired ?Respiratory system: Clear to auscultation. Respiratory effort normal. ?Cardiovascular system: S1 & S2 heard, RRR. No JVD, murmurs, rubs, gallops or clicks. No pedal edema. ?Gastrointestinal system: Abdomen is nondistended, soft and nontender. No organomegaly or masses felt. Normal bowel sounds heard. ? ?Data Reviewed: ?Noted noted improving lab work in face of hypertension ? ?Disposition:  ?Status is: Inpatient ?Remains inpatient appropriate because: Comfort care and expecting hospital death. ? ? ? ?Family Communication: Met with brother at bedside on 07/25/2021. ? ?DVT Prophylaxis: Comfort care ? ? ?Author: ?Darliss Cheney ,MD ?2021-08-24  10:56 AM ? ?To reach On-call, see care teams to locate the attending and reach out via [email or web address]. ?Between 7PM-7AM, please contact  night-coverage ?If you still have difficulty reaching the attending provider, please page the [facility] (Director on Call) for [facility] on amion for assistance. ? ?

## 2021-08-23 DEATH — deceased

## 2023-11-29 IMAGING — CT CT HEAD W/O CM
3 series · 17 of 37 positions shown, 19 images · non-contrast
Comparison: CT head dated 06/23/2021.

CLINICAL DATA: Altered mental status.



[Series 3: head wo · axial · 0.38mm/px · z∈[-126,-6]mm · 7 of 32 slices shown, 9 images]
[im 4/32  brain]
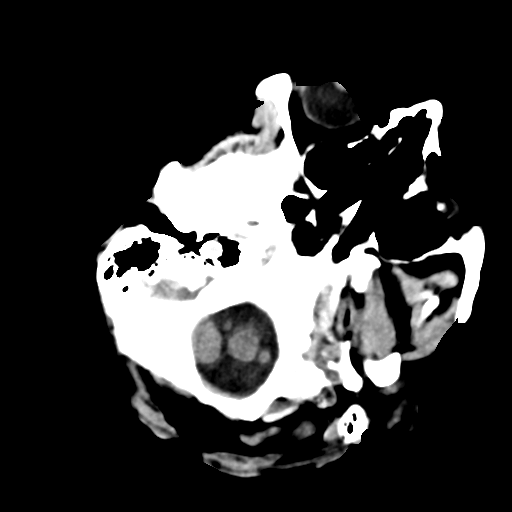
[im 4/32  bone]
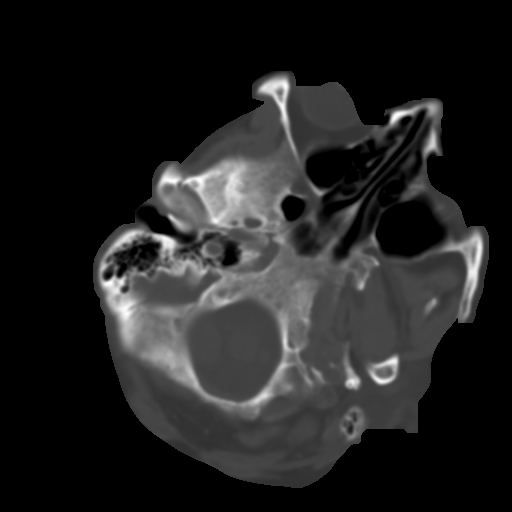
[im 8/32  brain]
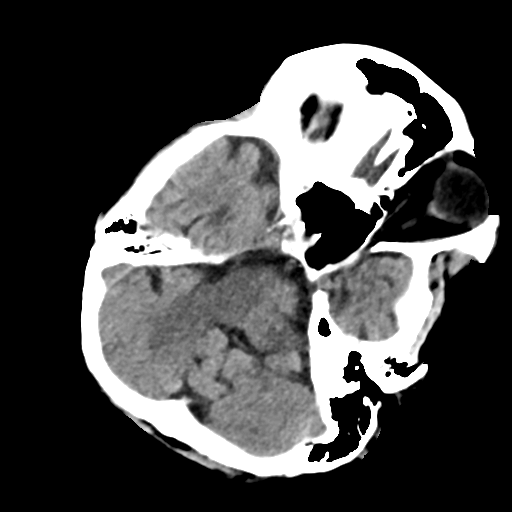
[im 12/32  brain]
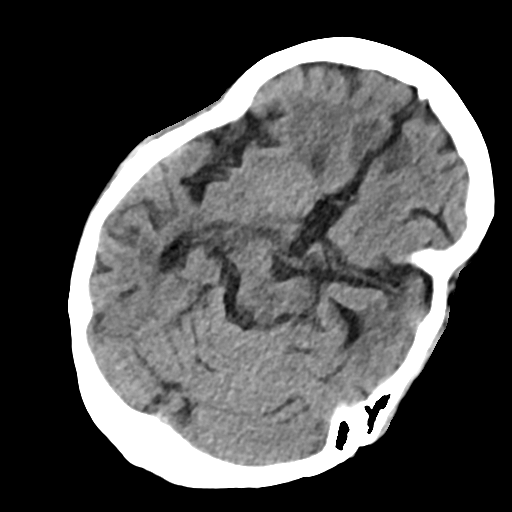
[im 16/32  brain]
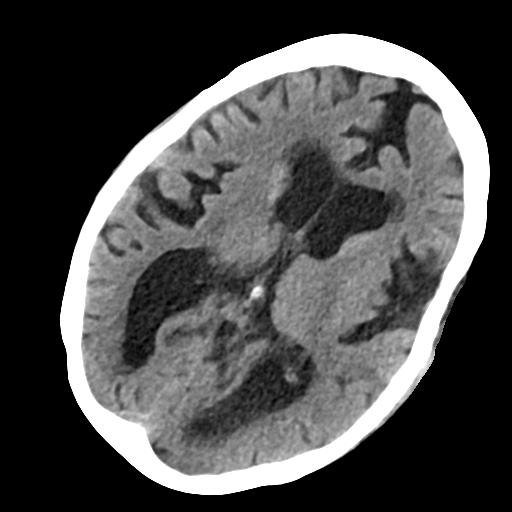
[im 20/32  brain]
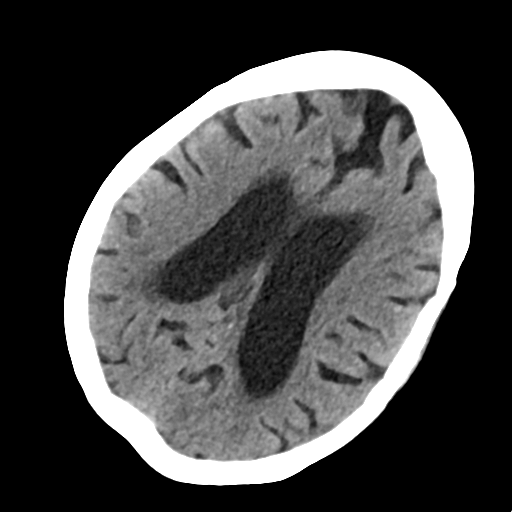
[im 20/32  bone]
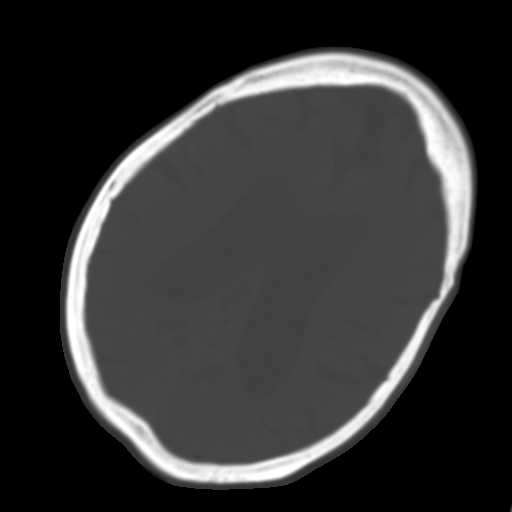
[im 24/32  brain]
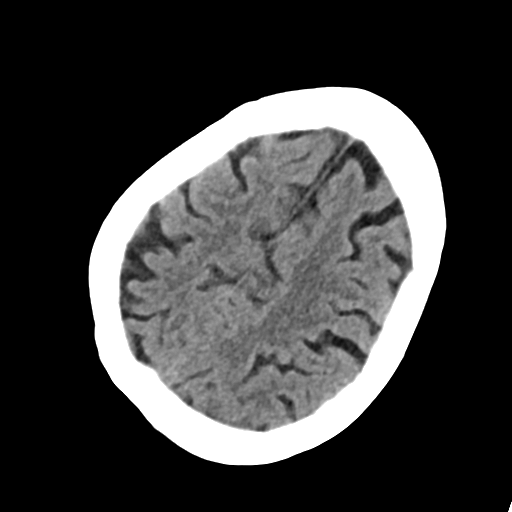
[im 28/32  brain]
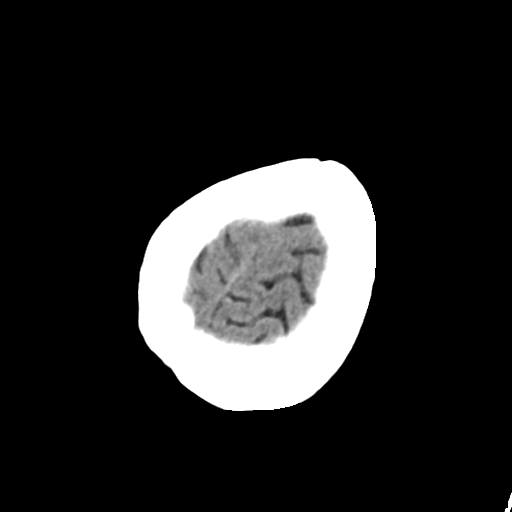

[Series 4: head bone · axial · 0.38mm/px · z∈[-128,-16]mm · 7 of 80 slices shown]
[im 8/80  bone]
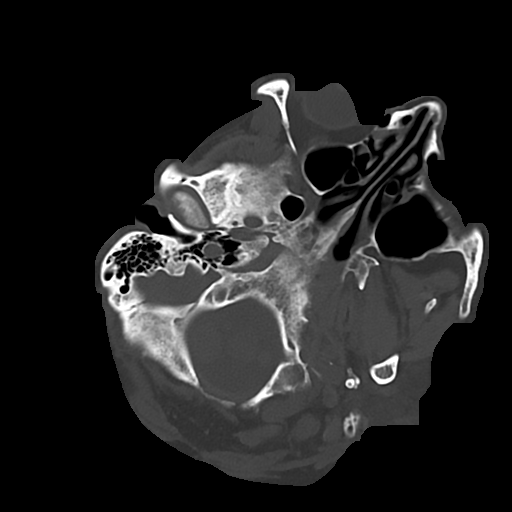
[im 16/80  bone]
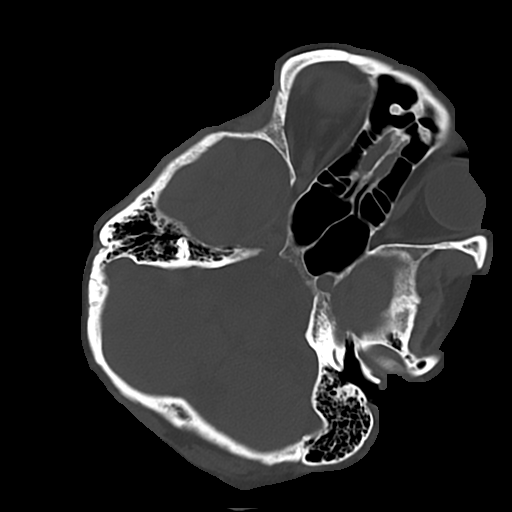
[im 24/80  bone]
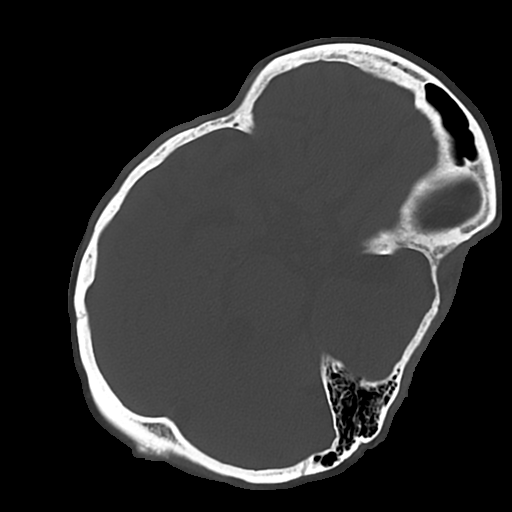
[im 36/80  bone]
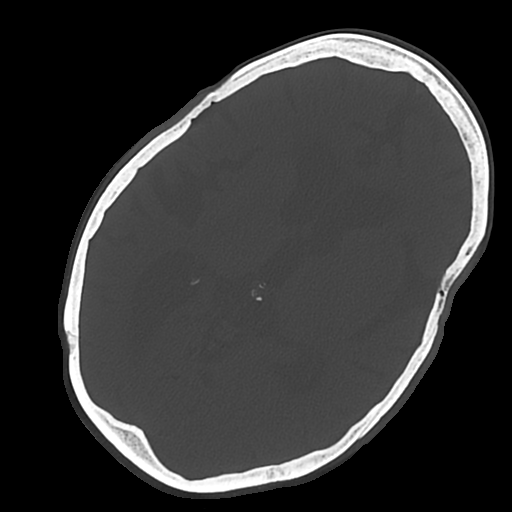
[im 44/80  bone]
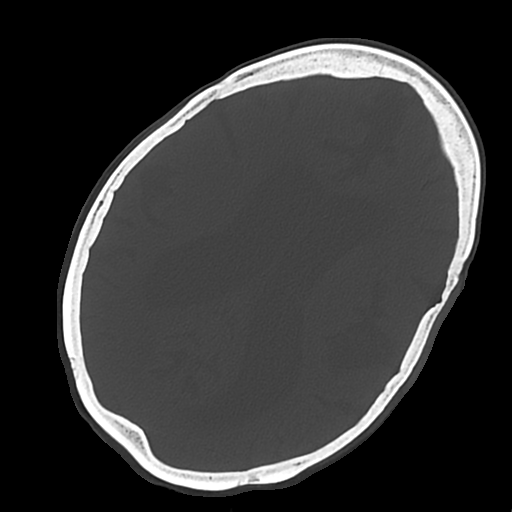
[im 56/80  bone]
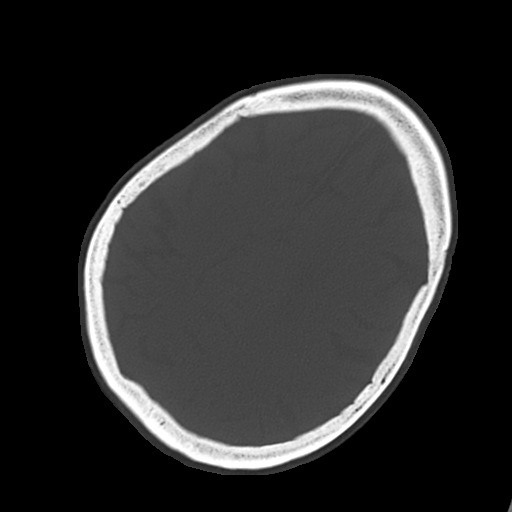
[im 64/80  bone]
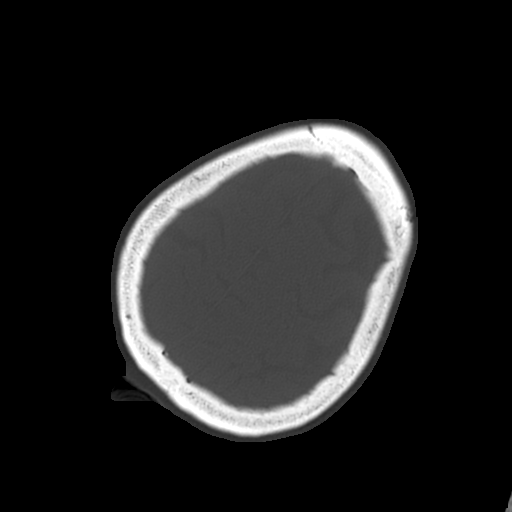

[Series 6: sag soft · sagittal · 0.29mm/px · 3 of 55 slices shown]
[im 19/55  brain]
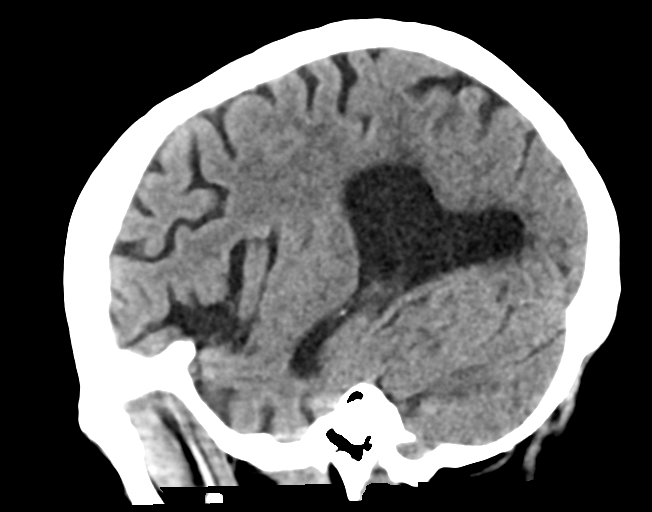
[im 28/55  brain]
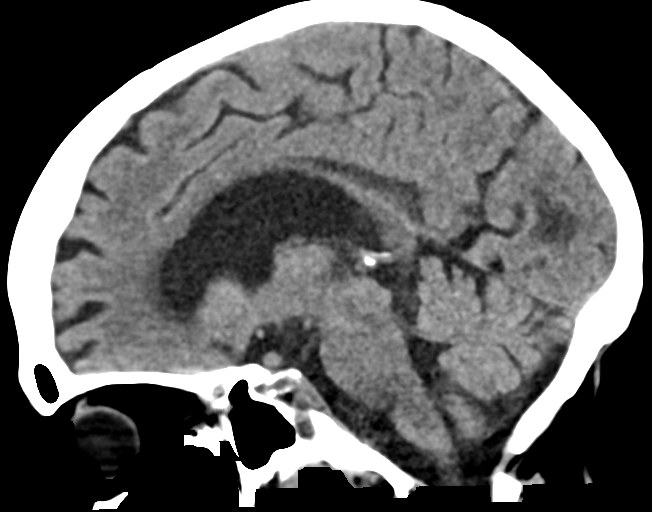
[im 37/55  brain]
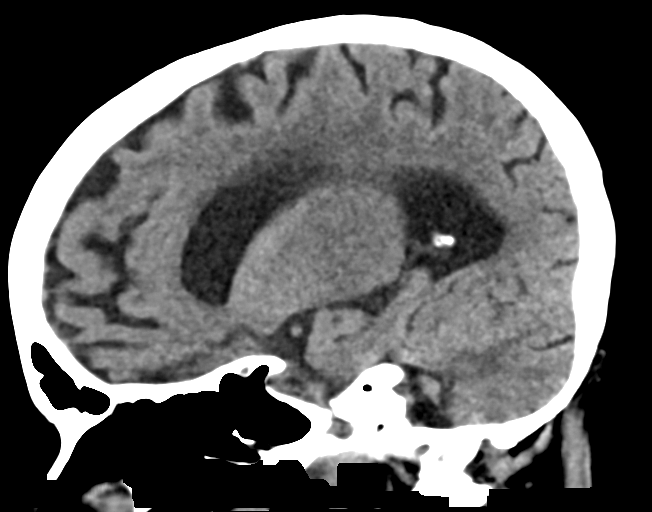

[17 of 37 positions shown; findings below may reference images not displayed]

FINDINGS: Brain: No evidence of acute infarction, hemorrhage, hydrocephalus,
extra-axial collection or mass lesion/mass effect. There is mild to
moderate cerebral volume loss with associated ex vacuo dilatation.
Periventricular white matter hypoattenuation likely represents
chronic small vessel ischemic disease.

Vascular: There are vascular calcifications in the carotid siphons.

Skull: Normal. Negative for fracture or focal lesion.

Sinuses/Orbits: No acute finding.

Other: None.
IMPRESSION: No acute intracranial process.

## 2023-12-03 IMAGING — DX DG CHEST 1V
1 series · 1 of 1 positions shown · non-contrast
Comparison: 07/16/2021

CLINICAL DATA: Shortness of breath

EXAM:
CHEST  1 VIEW

[chest ap]
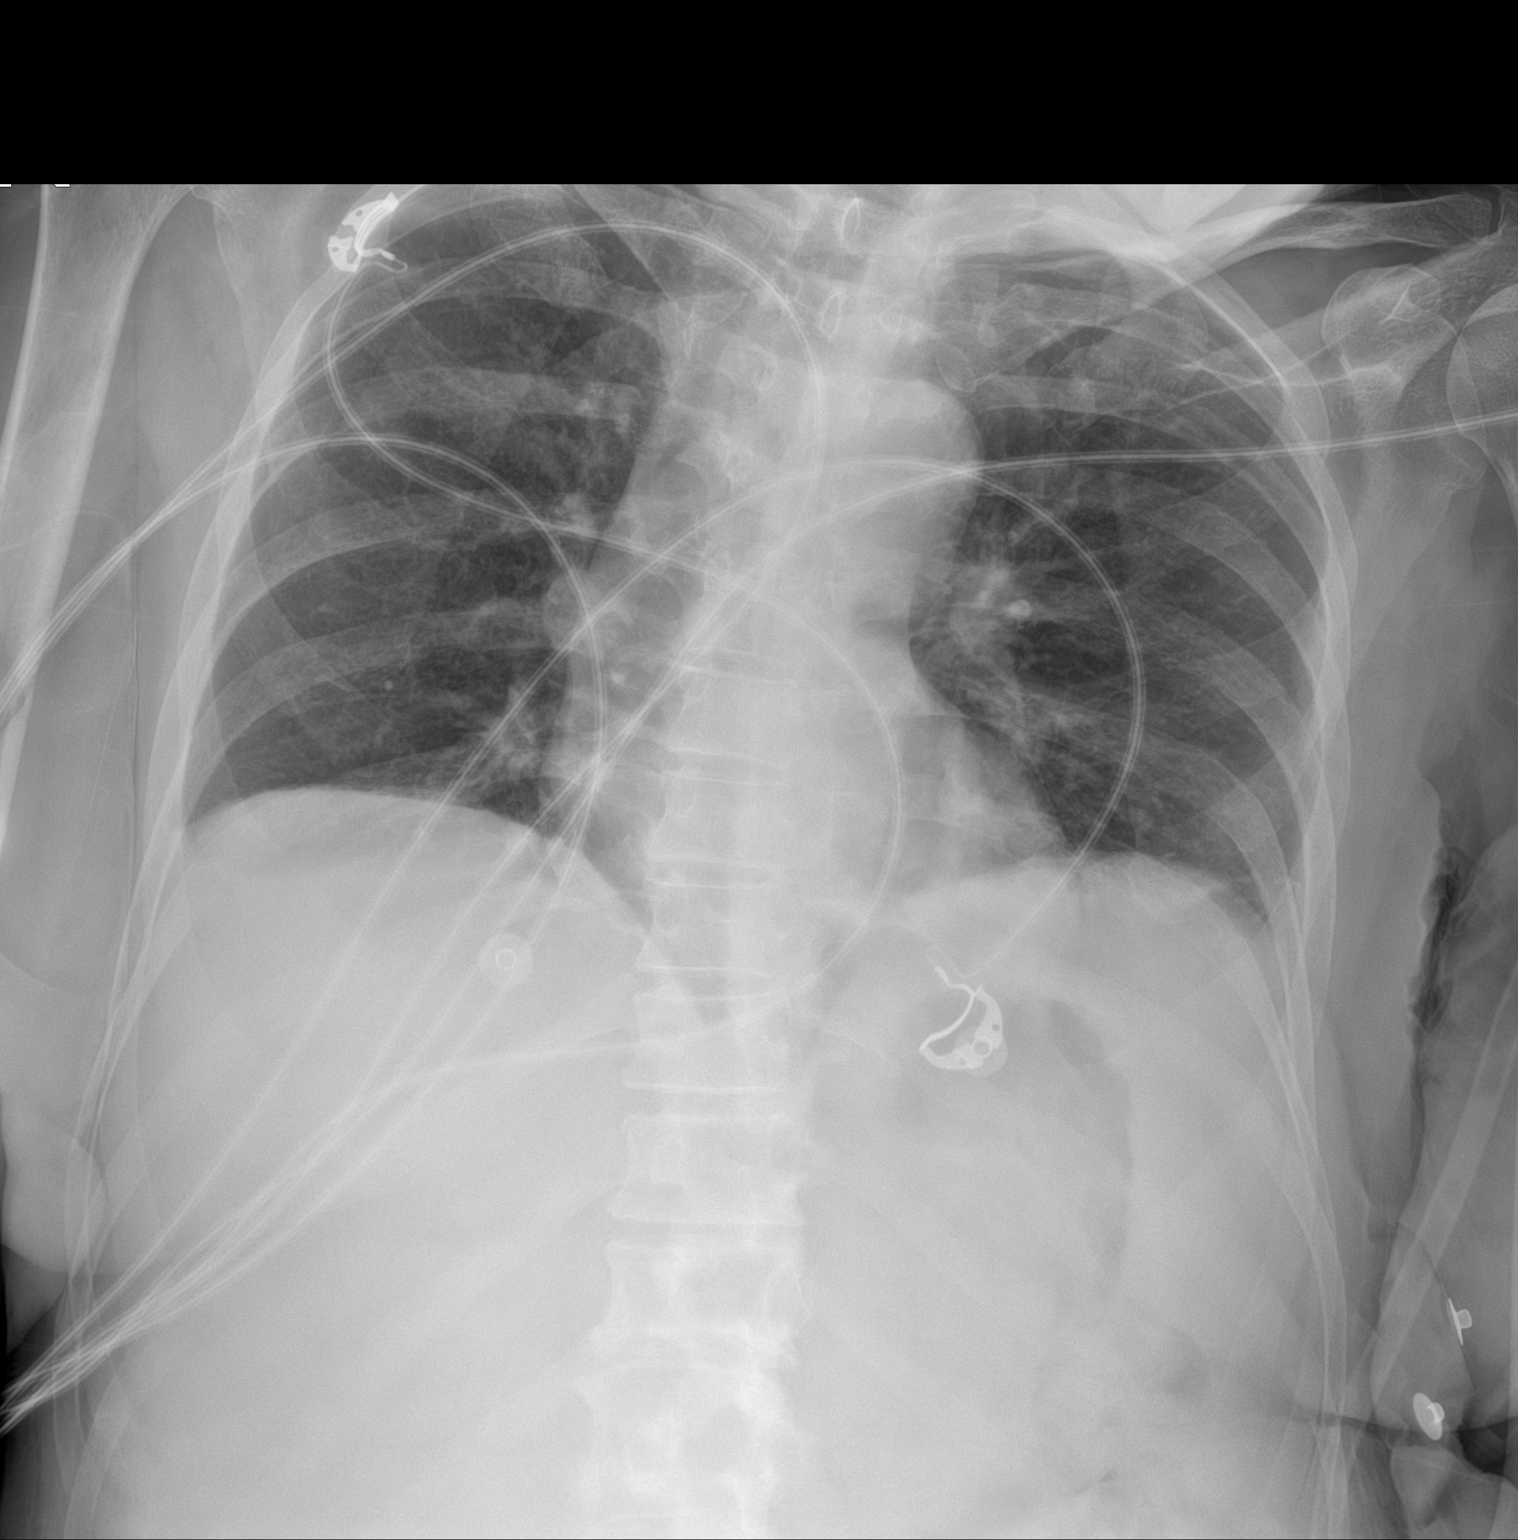

[1 of 1 positions shown; findings below may reference images not displayed]

FINDINGS: Left base atelectasis. Right lung clear. Heart is normal size. No
effusions or acute bony abnormality.
IMPRESSION: Left base atelectasis.
